# Patient Record
Sex: Female | Born: 1955
Health system: Southern US, Community
[De-identification: ages and names within clinical notes are randomized; demographics above are authoritative.]

## PROBLEM LIST (undated history)

## (undated) DIAGNOSIS — J90 Pleural effusion, not elsewhere classified: Secondary | ICD-10-CM

## (undated) DIAGNOSIS — E785 Hyperlipidemia, unspecified: Secondary | ICD-10-CM

## (undated) DIAGNOSIS — I2510 Atherosclerotic heart disease of native coronary artery without angina pectoris: Secondary | ICD-10-CM

## (undated) DIAGNOSIS — I10 Essential (primary) hypertension: Secondary | ICD-10-CM

## (undated) HISTORY — DX: Atherosclerotic heart disease of native coronary artery without angina pectoris: I25.10

---
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
## 2018-09-03 ENCOUNTER — Emergency Department (HOSPITAL_COMMUNITY)
Admission: EM | Admit: 2018-09-03 | Discharge: 2018-09-03 | Disposition: A | Discharge status: Home / Self Care | Payer: Self-pay | Attending: Emergency Medicine | Admitting: Emergency Medicine

## 2018-09-03 ENCOUNTER — Emergency Department (HOSPITAL_COMMUNITY): Payer: Self-pay

## 2018-09-03 ENCOUNTER — Encounter (HOSPITAL_COMMUNITY): Payer: Self-pay | Admitting: *Deleted

## 2018-09-03 DIAGNOSIS — I2510 Atherosclerotic heart disease of native coronary artery without angina pectoris: Secondary | ICD-10-CM | POA: Insufficient documentation

## 2018-09-03 DIAGNOSIS — K2100 Gastro-esophageal reflux disease with esophagitis, without bleeding: Secondary | ICD-10-CM

## 2018-09-03 DIAGNOSIS — I10 Essential (primary) hypertension: Secondary | ICD-10-CM | POA: Insufficient documentation

## 2018-09-03 DIAGNOSIS — R0789 Other chest pain: Secondary | ICD-10-CM | POA: Insufficient documentation

## 2018-09-03 HISTORY — DX: Essential (primary) hypertension: I10

## 2018-09-03 LAB — BASIC METABOLIC PANEL
ANION GAP: 5 (ref 5–15)
BUN: 9 mg/dL (ref 8–23)
CALCIUM: 9.5 mg/dL (ref 8.9–10.3)
CO2: 31 mmol/L (ref 22–32)
Chloride: 106 mmol/L (ref 98–111)
Creatinine, Ser: 0.94 mg/dL (ref 0.44–1.00)
GLUCOSE: 140 mg/dL — AB (ref 70–99)
Potassium: 3.9 mmol/L (ref 3.5–5.1)
Sodium: 142 mmol/L (ref 135–145)

## 2018-09-03 LAB — CBC
HCT: 41.1 % (ref 36.0–46.0)
Hemoglobin: 12.2 g/dL (ref 12.0–15.0)
MCH: 26.3 pg (ref 26.0–34.0)
MCHC: 29.7 g/dL — ABNORMAL LOW (ref 30.0–36.0)
MCV: 88.6 fL (ref 80.0–100.0)
NRBC: 0 % (ref 0.0–0.2)
PLATELETS: 302 10*3/uL (ref 150–400)
RBC: 4.64 MIL/uL (ref 3.87–5.11)
RDW: 13.2 % (ref 11.5–15.5)
WBC: 7.5 10*3/uL (ref 4.0–10.5)

## 2018-09-03 LAB — I-STAT TROPONIN, ED: Troponin i, poc: 0.01 ng/mL (ref 0.00–0.08)

## 2018-09-03 MED ORDER — SUCRALFATE 1 G PO TABS: 1.0000 g | ORAL_TABLET | Freq: Three times a day (TID) | ORAL | 0 refills | Status: DC

## 2018-09-03 MED ORDER — ALUM & MAG HYDROXIDE-SIMETH 200-200-20 MG/5ML PO SUSP
30.0000 mL | Freq: Once | ORAL | Status: AC
  Administered 2018-09-03: 30 mL via ORAL
  Filled 2018-09-03: qty 30

## 2018-09-03 MED ORDER — PANTOPRAZOLE SODIUM 20 MG PO TBEC: 20.0000 mg | DELAYED_RELEASE_TABLET | Freq: Two times a day (BID) | ORAL | 0 refills | Status: DC

## 2018-09-03 MED ORDER — AMLODIPINE BESYLATE 5 MG PO TABS: 10.0000 mg | ORAL_TABLET | Freq: Every day | ORAL | 0 refills | Status: DC

## 2018-09-03 NOTE — ED Triage Notes (Signed)
Pt in c/o chest pain that started three days ago, history of same, denies shortness of breath, no distress noted

## 2018-09-03 NOTE — ED Provider Notes (Signed)
[facility] EMERGENCY DEPARTMENT Provider Note   CSN: [id number] Arrival date & time: 09/03/18  1232     History   Chief Complaint Chief Complaint  Patient presents with  . Chest Pain    HPI Kimberly Bryant is a 62 y.o. female who presents with chest pain. PMH significant for reflux, chronic chest pain, and HTN. The patient's niece and the niece's husband are at bedside and help translate since the patient speaks only [place]. They state that the patient has had chest pain for years. It is worse when she is walking around and when she eats. She always vomits when she eats and she has bad breath. She recently moved here from [place]. She has never had a endoscopy or colonoscopy. She does not have a PCP here. No fever, cough, SOB, palpitations, leg swelling, abdominal pain, constipation/diarrhea.  HPI  Past Medical History:  Diagnosis Date  . Coronary artery disease   . Hyperlipemia   . Hypertension     There are no active problems to display for this patient.   History reviewed. No pertinent surgical history.   OB History   None      Home Medications    Prior to Admission medications   Not on File    Family History History reviewed. No pertinent family history.  Social History Social History   Tobacco Use  . Smoking status: Never Smoker  . Smokeless tobacco: Never Used  Substance Use Topics  . Alcohol use: Not on file  . Drug use: Not on file     Allergies   Patient has no known allergies.   Review of Systems Review of Systems  Constitutional: Negative for fever.  HENT: Negative for sore throat and trouble swallowing.   Respiratory: Negative for cough, shortness of breath and wheezing.   Cardiovascular: Positive for chest pain. Negative for palpitations and leg swelling.  Gastrointestinal: Positive for vomiting. Negative for abdominal pain, diarrhea and nausea.  All other systems reviewed and are negative.    Physical  Exam Updated Vital Signs BP (!) 158/76   Pulse 65   Temp 98.3 F (36.8 C) (Oral)   Resp 19   SpO2 100%   Physical Exam  Constitutional: She is oriented to person, place, and time. She appears well-developed and well-nourished. No distress.  Calm, cooperative female in NAD  HENT:  Head: Normocephalic and atraumatic.  No throat swelling or redness  Eyes: Pupils are equal, round, and reactive to light. Conjunctivae are normal. Right eye exhibits no discharge. Left eye exhibits no discharge. No scleral icterus.  Neck: Normal range of motion.  Cardiovascular: Normal rate and regular rhythm.  Pulmonary/Chest: Effort normal and breath sounds normal. No respiratory distress.  Abdominal: Soft. Bowel sounds are normal. She exhibits no distension. There is tenderness (epigastric tenderness).  Musculoskeletal:  No peripheral edema  Neurological: She is alert and oriented to person, place, and time.  Skin: Skin is warm and dry.  Psychiatric: She has a normal mood and affect. Her behavior is normal.  Nursing note and vitals reviewed.    ED Treatments / Results  Labs (all labs ordered are listed, but only abnormal results are displayed) Labs Reviewed  BASIC METABOLIC PANEL - Abnormal; Notable for the following components:      Result Value   Glucose, Bld 140 (*)    All other components within normal limits  CBC - Abnormal; Notable for the following components:   MCHC 29.7 (*)  All other components within normal limits  I-STAT TROPONIN, ED    EKG None  Radiology Dg Chest 2 View  Result Date: 09/03/2018 CLINICAL DATA:  Onset of weakness and chest pain today. EXAM: CHEST - 2 VIEW COMPARISON:  None. FINDINGS: The lungs are adequately inflated. The interstitial markings are increased at the lung bases posteriorly greatest on the right. There is no pleural effusion. The cardiac silhouette is mildly enlarged. The pulmonary vascularity is not clearly engorged. There is tortuosity of the  descending thoracic aorta. The bony thorax exhibits no acute abnormality. IMPRESSION: Bibasilar atelectasis or early pneumonia greatest on the right. Followup PA and lateral chest X-ray is recommended in 3-4 weeks following trial of antibiotic therapy to ensure resolution and exclude underlying malignancy. Electronically Signed   By: David  [place] M.D.   On: 09/03/2018 13:15    Procedures Procedures (including critical care time)  Medications Ordered in ED Medications  alum & mag hydroxide-simeth (MAALOX/MYLANTA) 200-200-20 MG/5ML suspension 30 mL (30 mLs Oral Given 09/03/18 1457)     Initial Impression / Assessment and Plan / ED Course  I have reviewed the triage vital signs and the nursing notes.  Pertinent labs & imaging results that were available during my care of the patient were reviewed by me and considered in my medical decision making (see chart for details).  62 year old female presents with chest pain which is worse after eating and walking. Pain is atypical for ACS. Doubt PE, pericarditis, esophageal rupture, tension pneumothorax, aortic dissection, cardiac tamponade. EKG is NSR. CXR shows bibasilar atelectasis. There is a comment about possible pneumonia but since she has no fever, cough, leukocytosis this is unlikely. Initial troponin is 0. Will not repeat as this is a chronic problem. Labs are unremarkable. HEART score is 2. She is already on protonix. Will refill meds and give rx for Carafate. CM was consulted to help establish with a PCP in the area. They were encouraged to follow up with PCP and eventually GI to get an endoscopy and screening colonoscopy.   Final Clinical Impressions(s) / ED Diagnoses   Final diagnoses:  Atypical chest pain  Gastroesophageal reflux disease with esophagitis    ED Discharge Orders    None       Dayne, Dekay, PA-C 09/03/18 1636    Gerhard Munch, MD 09/04/18 [phone]

## 2018-09-03 NOTE — Progress Notes (Signed)
CSW met with pt at pt's bedside. Pt recently moved to the area in need of PCP. CSW provided pt and pt's family with information about the [facility] and applying for an [employer]. Encouraged pt and pt's family to follow up with [facility] tomorrow for an appointment.   Wendelyn Breslow, [facility]  [phone]

## 2018-09-03 NOTE — Discharge Instructions (Signed)
Take Amlodipine for blood pressure once a day Take Protonix (acid reducer) twice a day Take Carafate as directed (stomach medicine) Please follow up with a primary doctor

## 2018-09-15 ENCOUNTER — Encounter: Payer: Self-pay | Admitting: Critical Care Medicine

## 2018-09-15 ENCOUNTER — Ambulatory Visit: Payer: Self-pay | Attending: Critical Care Medicine | Admitting: Critical Care Medicine

## 2018-09-15 ENCOUNTER — Other Ambulatory Visit: Payer: Self-pay

## 2018-09-15 VITALS — BP 149/86 | HR 73 | Temp 98.5°F | Resp 16 | Wt 205.6 lb

## 2018-09-15 DIAGNOSIS — I10 Essential (primary) hypertension: Secondary | ICD-10-CM | POA: Insufficient documentation

## 2018-09-15 DIAGNOSIS — J9811 Atelectasis: Secondary | ICD-10-CM | POA: Insufficient documentation

## 2018-09-15 DIAGNOSIS — K219 Gastro-esophageal reflux disease without esophagitis: Secondary | ICD-10-CM | POA: Insufficient documentation

## 2018-09-15 DIAGNOSIS — Z79899 Other long term (current) drug therapy: Secondary | ICD-10-CM | POA: Insufficient documentation

## 2018-09-15 DIAGNOSIS — R600 Localized edema: Secondary | ICD-10-CM | POA: Insufficient documentation

## 2018-09-15 DIAGNOSIS — I2510 Atherosclerotic heart disease of native coronary artery without angina pectoris: Secondary | ICD-10-CM | POA: Insufficient documentation

## 2018-09-15 DIAGNOSIS — G8929 Other chronic pain: Secondary | ICD-10-CM | POA: Insufficient documentation

## 2018-09-15 DIAGNOSIS — E785 Hyperlipidemia, unspecified: Secondary | ICD-10-CM | POA: Insufficient documentation

## 2018-09-15 DIAGNOSIS — K2100 Gastro-esophageal reflux disease with esophagitis, without bleeding: Secondary | ICD-10-CM

## 2018-09-15 DIAGNOSIS — R0789 Other chest pain: Secondary | ICD-10-CM | POA: Insufficient documentation

## 2018-09-15 DIAGNOSIS — Z8249 Family history of ischemic heart disease and other diseases of the circulatory system: Secondary | ICD-10-CM | POA: Insufficient documentation

## 2018-09-15 MED ORDER — PANTOPRAZOLE SODIUM 20 MG PO TBEC: 20.0000 mg | DELAYED_RELEASE_TABLET | Freq: Two times a day (BID) | ORAL | 3 refills | Status: DC

## 2018-09-15 MED ORDER — METOPROLOL SUCCINATE ER 25 MG PO TB24: 25.0000 mg | ORAL_TABLET | Freq: Every day | ORAL | 3 refills | Status: DC

## 2018-09-15 MED ORDER — AMLODIPINE BESYLATE 5 MG PO TABS: 10.0000 mg | ORAL_TABLET | Freq: Every day | ORAL | 3 refills | Status: DC

## 2018-09-15 MED ORDER — SUCRALFATE 1 G PO TABS: 1.0000 g | ORAL_TABLET | Freq: Three times a day (TID) | ORAL | 3 refills | Status: DC

## 2018-09-15 NOTE — Assessment & Plan Note (Signed)
Significant reflux disease as etiology of chest pain syndrome Plan Continue Carafate 4 times daily Continue Protonix 20 mg twice daily Provide patient with a reflux diet The patient ultimately may need a gastroenterology consultation but first we will obtain outside records

## 2018-09-15 NOTE — Assessment & Plan Note (Signed)
There are no outside records to review By history the patient had coronary disease of uncertain time point with an intervention performed We will need to obtain outside records

## 2018-09-15 NOTE — Assessment & Plan Note (Signed)
Atypical chest pain syndrome Plan Continue to treat reflux symptoms Obtain outside records The most recent event appears to be noncardiac

## 2018-09-15 NOTE — Assessment & Plan Note (Signed)
History of hyperlipidemia Need to obtain outside records

## 2018-09-15 NOTE — Patient Instructions (Addendum)
Continue amlodipine 10 mg daily Start metoprolol 25 mg daily Continue pantoprazole 20 mg twice daily at meals Continue Carafate 1 4 times daily with meals Follow reflux diet We will obtain outside records from your previous primary care physician We will establish here for primary care over the next month   Food Choices for Gastroesophageal Reflux Disease, Adult When you have gastroesophageal reflux disease (GERD), the foods you eat and your eating habits are very important. Choosing the right foods can help ease your discomfort. What guidelines do I need to follow?  Choose fruits, vegetables, whole grains, and low-fat dairy products.  Choose low-fat meat, fish, and poultry.  Limit fats such as oils, salad dressings, butter, nuts, and avocado.  Keep a food diary. This helps you identify foods that cause symptoms.  Avoid foods that cause symptoms. These may be different for everyone.  Eat small meals often instead of 3 large meals a day.  Eat your meals slowly, in a place where you are relaxed.  Limit fried foods.  Cook foods using methods other than frying.  Avoid drinking alcohol.  Avoid drinking large amounts of liquids with your meals.  Avoid bending over or lying down until 2-3 hours after eating. What foods are not recommended? These are some foods and drinks that may make your symptoms worse: Vegetables Tomatoes. Tomato juice. Tomato and spaghetti sauce. Chili peppers. Onion and garlic. Horseradish. Fruits Oranges, grapefruit, and lemon (fruit and juice). Meats High-fat meats, fish, and poultry. This includes hot dogs, ribs, ham, sausage, salami, and bacon. Dairy Whole milk and chocolate milk. Sour cream. Cream. Butter. Ice cream. Cream cheese. Drinks Coffee and tea. Bubbly (carbonated) drinks or energy drinks. Condiments Hot sauce. Barbecue sauce. Sweets/Desserts Chocolate and cocoa. Donuts. Peppermint and spearmint. Fats and Oils High-fat foods. This  includes [place] fries and potato chips. Other Vinegar. Strong spices. This includes black pepper, white pepper, red pepper, cayenne, curry powder, cloves, ginger, and chili powder. The items listed above may not be a complete list of foods and drinks to avoid. Contact your dietitian for more information. This information is not intended to replace advice given to you by your health care provider. Make sure you discuss any questions you have with your health care provider. Document Released: 04/22/2012 Document Revised: 03/29/2016 Document Reviewed: 08/26/2013 Elsevier Interactive Patient Education  2017 [employer].

## 2018-09-15 NOTE — Progress Notes (Signed)
HFU: pt states she feels better. Takes medication daily.

## 2018-09-15 NOTE — Progress Notes (Signed)
Subjective:    Patient ID: Kimberly Bryant, female    DOB: [birth year], 62 y.o.   MRN: [id number]  62 y.o.F here for emergency room follow-up which occurred on 09/03/2018.  Patient was seen for chest pain which appeared to be gastrointestinal in origin.  Cardiac enzymes were negative and chest x-ray showed bibasilar atelectasis. Impression was that of a gastrointestinal reflux flare.  Patient had her Protonix refilled and was given also a dose of Carafate and Maalox.  Note CBC was unremarkable and there was no fever and her heart score was 2  The patient just moved here from [place] and needs to establish for primary care and as well needs gastroenterology follow-up.  Note the patient only speaks [place]  [place]    Hx of CAD with PTCA in past. Hx of chronic chest pain.  While living in [place] Difficulty walking cannot feel anything.  ? CVA.  Legs are better now. Pain now is better   Meal plan:  AM: eats rice, eats a large amount.  Lunch Rice,  Dinner :  Rice    Chest Pain   This is a chronic problem. The current episode started more than 1 year ago. The onset quality is undetermined. The problem occurs constantly. The problem has been rapidly improving. The pain is present in the epigastric region and substernal region. The pain is at a severity of 2/10. The pain is mild. The quality of the pain is described as burning. The pain does not radiate. Associated symptoms include abdominal pain. Pertinent negatives include no cough, diaphoresis, exertional chest pressure, fever, headaches, irregular heartbeat, palpitations, shortness of breath, sputum production or weakness. Associated symptoms comments: Bad odor to breath Worse if eats. She has tried antacids (PPI, carafate ) for the symptoms. The treatment provided significant relief.   Past Medical History:  Diagnosis Date  . CAD (coronary artery disease)   . Hypertension      Family History  Problem Relation Age of Onset  .  Hypertension Mother      Social History   Socioeconomic History  . Marital status: Married    Spouse name: Not on file  . Number of children: Not on file  . Years of education: Not on file  . Highest education level: Not on file  Occupational History  . Not on file  Social Needs  . Financial resource strain: Not on file  . Food insecurity:    Worry: Not on file    Inability: Not on file  . Transportation needs:    Medical: Not on file    Non-medical: Not on file  Tobacco Use  . Smoking status: Never Smoker  . Smokeless tobacco: Never Used  Substance and Sexual Activity  . Alcohol use: Never    Frequency: Never  . Drug use: Not on file  . Sexual activity: Not on file  Lifestyle  . Physical activity:    Days per week: Not on file    Minutes per session: Not on file  . Stress: Not on file  Relationships  . Social connections:    Talks on phone: Not on file    Gets together: Not on file    Attends religious service: Not on file    Active member of club or organization: Not on file    Attends meetings of clubs or organizations: Not on file    Relationship status: Not on file  . Intimate partner violence:    Fear of current  or ex partner: Not on file    Emotionally abused: Not on file    Physically abused: Not on file    Forced sexual activity: Not on file  Other Topics Concern  . Not on file  Social History Narrative  . Not on file     No Known Allergies   Outpatient Medications Prior to Visit  Medication Sig Dispense Refill  . amLODipine (NORVASC) 5 MG tablet Take 2 tablets (10 mg total) by mouth daily. 30 tablet 0  . pantoprazole (PROTONIX) 20 MG tablet Take 1 tablet (20 mg total) by mouth 2 (two) times daily before a meal. 60 tablet 0  . sucralfate (CARAFATE) 1 g tablet Take 1 tablet (1 g total) by mouth 4 (four) times daily -  with meals and at bedtime. 90 tablet 0   No facility-administered medications prior to visit.       Review of Systems    Constitutional: Negative for diaphoresis and fever.  Respiratory: Negative for cough, sputum production and shortness of breath.   Cardiovascular: Positive for chest pain. Negative for palpitations.  Gastrointestinal: Positive for abdominal pain.  Neurological: Negative for weakness and headaches.       Objective:   Physical Exam There were no vitals filed for this visit.  Gen: Pleasant, well-nourished, in no distress,  normal affect  ENT: No lesions,  mouth clear,  oropharynx clear, no postnasal drip  Neck: No JVD, no TMG, no carotid bruits  Lungs: No use of accessory muscles, no dullness to percussion, clear without rales or rhonchi  Cardiovascular: RRR, heart sounds normal, Holo systolic murmur but no gallops, 1+  peripheral edema  Abdomen: soft and NT, no HSM,  BS normal  Musculoskeletal: No deformities, no cyanosis or clubbing  Neuro: alert, non focal  Skin: Warm, no lesions or rashes  No results found.  CXR 10/30 IMPRESSION: Bibasilar atelectasis or early pneumonia greatest on the right. Followup PA and lateral chest X-ray is recommended in 3-4 weeks following trial of antibiotic therapy to ensure resolution and exclude underlying malignancy.  EKG on 09/03/2018 showed normal sinus rhythm and nonspecific T wave changes  Troponin was 0  Lab Results  Component Value Date   WBC 7.5 09/03/2018   HGB 12.2 09/03/2018   HCT 41.1 09/03/2018   MCV 88.6 09/03/2018   PLT 302 09/03/2018   BMP Latest Ref Rng & Units 09/03/2018  Glucose 70 - 99 mg/dL 130(Q)  BUN 8 - 23 mg/dL 9  Creatinine 6.57 - 8.46 mg/dL 9.62  Sodium 952 - 841 mmol/L 142  Potassium 3.5 - 5.1 mmol/L 3.9  Chloride 98 - 111 mmol/L 106  CO2 22 - 32 mmol/L 31  Calcium 8.9 - 10.3 mg/dL 9.5        Assessment & Plan:  I personally reviewed all images and lab data in the [facility] system as well as any outside material available during this office visit and agree with the  radiology impressions.   CAD  (coronary artery disease) There are no outside records to review By history the patient had coronary disease of uncertain time point with an intervention performed We will need to obtain outside records  HTN (hypertension) Hypertension poorly controlled with associated minimal peripheral edema and mild tachycardia in the setting of potential coronary disease Plan We will add a beta-blocker metoprolol 25 mg daily Continue amlodipine 10 mg daily Note creatinine was normal  GERD (gastroesophageal reflux disease) Significant reflux disease as etiology of chest pain syndrome Plan  Continue Carafate 4 times daily Continue Protonix 20 mg twice daily Provide patient with a reflux diet The patient ultimately may need a gastroenterology consultation but first we will obtain outside records  Chest pain, atypical Atypical chest pain syndrome Plan Continue to treat reflux symptoms Obtain outside records The most recent event appears to be noncardiac   Hyperlipidemia History of hyperlipidemia Need to obtain outside records   Kimberly Bryant was seen today for hospitalization follow-up.  Diagnoses and all orders for this visit:  Gastroesophageal reflux disease with esophagitis  Coronary artery disease involving native heart without angina pectoris, unspecified vessel or lesion type  Essential hypertension  Chest pain, atypical  Hyperlipidemia, unspecified hyperlipidemia type  Other orders -     sucralfate (CARAFATE) 1 g tablet; Take 1 tablet (1 g total) by mouth 4 (four) times daily -  with meals and at bedtime. -     pantoprazole (PROTONIX) 20 MG tablet; Take 1 tablet (20 mg total) by mouth 2 (two) times daily before a meal. -     amLODipine (NORVASC) 5 MG tablet; Take 2 tablets (10 mg total) by mouth daily. -     metoprolol succinate (TOPROL-XL) 25 MG 24 hr tablet; Take 1 tablet (25 mg total) by mouth daily.    I had an extended discussion with the patient and or family lasting 10  minutes of a 25 minute visit including: Reflux diet directions

## 2018-09-15 NOTE — Assessment & Plan Note (Signed)
Hypertension poorly controlled with associated minimal peripheral edema and mild tachycardia in the setting of potential coronary disease Plan We will add a beta-blocker metoprolol 25 mg daily Continue amlodipine 10 mg daily Note creatinine was normal

## 2018-10-15 ENCOUNTER — Ambulatory Visit: Payer: Self-pay | Admitting: Family Medicine

## 2018-10-31 ENCOUNTER — Encounter: Payer: Self-pay | Admitting: Family Medicine

## 2018-10-31 ENCOUNTER — Ambulatory Visit: Payer: Self-pay | Attending: Family Medicine | Admitting: Family Medicine

## 2018-10-31 VITALS — BP 184/105 | HR 92 | Temp 98.2°F | Resp 18 | Ht 60.0 in | Wt 200.0 lb

## 2018-10-31 DIAGNOSIS — Z8249 Family history of ischemic heart disease and other diseases of the circulatory system: Secondary | ICD-10-CM | POA: Insufficient documentation

## 2018-10-31 DIAGNOSIS — R918 Other nonspecific abnormal finding of lung field: Secondary | ICD-10-CM | POA: Insufficient documentation

## 2018-10-31 DIAGNOSIS — R1115 Cyclical vomiting syndrome unrelated to migraine: Secondary | ICD-10-CM

## 2018-10-31 DIAGNOSIS — E785 Hyperlipidemia, unspecified: Secondary | ICD-10-CM | POA: Insufficient documentation

## 2018-10-31 DIAGNOSIS — R1013 Epigastric pain: Secondary | ICD-10-CM

## 2018-10-31 DIAGNOSIS — R0789 Other chest pain: Secondary | ICD-10-CM

## 2018-10-31 DIAGNOSIS — K2100 Gastro-esophageal reflux disease with esophagitis, without bleeding: Secondary | ICD-10-CM

## 2018-10-31 DIAGNOSIS — I10 Essential (primary) hypertension: Secondary | ICD-10-CM

## 2018-10-31 DIAGNOSIS — R1110 Vomiting, unspecified: Secondary | ICD-10-CM | POA: Insufficient documentation

## 2018-10-31 DIAGNOSIS — R9389 Abnormal findings on diagnostic imaging of other specified body structures: Secondary | ICD-10-CM

## 2018-10-31 DIAGNOSIS — G8929 Other chronic pain: Secondary | ICD-10-CM | POA: Insufficient documentation

## 2018-10-31 DIAGNOSIS — I2510 Atherosclerotic heart disease of native coronary artery without angina pectoris: Secondary | ICD-10-CM

## 2018-10-31 MED ORDER — METOPROLOL SUCCINATE ER 25 MG PO TB24: 25.0000 mg | ORAL_TABLET | Freq: Every day | ORAL | 3 refills | Status: DC

## 2018-10-31 MED ORDER — LANSOPRAZOLE 30 MG PO CPDR: 30.0000 mg | DELAYED_RELEASE_CAPSULE | Freq: Two times a day (BID) | ORAL | 2 refills | Status: DC

## 2018-10-31 MED ORDER — AMLODIPINE BESYLATE 5 MG PO TABS: 10.0000 mg | ORAL_TABLET | Freq: Every day | ORAL | 3 refills | Status: DC

## 2018-10-31 MED ORDER — CLARITHROMYCIN 500 MG PO TABS: 500.0000 mg | ORAL_TABLET | Freq: Two times a day (BID) | ORAL | 0 refills | Status: AC

## 2018-10-31 MED ORDER — SUCRALFATE 1 G PO TABS: 1.0000 g | ORAL_TABLET | Freq: Three times a day (TID) | ORAL | 3 refills | Status: DC

## 2018-10-31 MED ORDER — AMOXICILLIN 500 MG PO CAPS: ORAL_CAPSULE | ORAL | 0 refills | Status: DC

## 2018-10-31 MED FILL — CLARITHROMYCIN 500 MG TAB: 500 | 14 days supply | Qty: 28 | Fill #0

## 2018-10-31 MED FILL — AMOXICILLIN 500 MG CAPSULE: 500 | 14 days supply | Qty: 56 | Fill #0

## 2018-10-31 MED FILL — METOPROLOL SUCCINATE ER 25: 25 | 30 days supply | Qty: 30 | Fill #0

## 2018-10-31 MED FILL — LANSOPRAZOLE DR 30 MG CAPSU: 30 | 30 days supply | Qty: 60 | Fill #0

## 2018-10-31 MED FILL — SUCRALFATE 1 GM TABLET: 1 | 22 days supply | Qty: 90 | Fill #0

## 2018-10-31 MED FILL — AMLODIPINE BESYLATE 5 MG TA: 5 | 15 days supply | Qty: 30 | Fill #0

## 2018-10-31 NOTE — Progress Notes (Signed)
Subjective:    Patient ID: Kimberly Bryant, female    DOB: 03/13/1956, 62 y.o.   MRN: [id number]   Due to a language barrier, patient's niece as well as [employer] was used at today's visit.   HPI       62 yo female who was seen in the emergency department on 09/03/2018 secondary to atypical chest pain and per ED records, patient with past medical history of reflux, chronic chest pain, hyperlipidemia, possible past heart attack and hypertension.  At the time of her emergency department visit, patient recently moved from [place].  Patient had blood work done in the emergency department which was significant for glucose of 140 on BMP.  No EKG was done during her emergency department visit as her chest pain was atypical for heart disease as the chest pain occurs after eating. (I could not find an actual EKG report or copy of an EKG done in the emergency department but emergency room doctor mentions in his dictation that patient with EKG showing normal sinus rhythm).   Patient was given a refill of Protonix that she was already taking and prescription was given for Carafate.  Patient had chest x-ray which showed atelectasis versus pneumonia but since patient did not have a fever, cough and no leukocytosis on CBC, it was thought that her x-ray was most likely compatible with atelectasis.  Patient was asked to establish with a primary care physician and patient was seen here in the office on 09/15/2018 by another provider for initial appointment status post ED visit.  At her visit, patient was started on metoprolol 25 mg daily secondary to possible history of coronary artery disease and patient with mild tachycardia and suboptimal control of hypertension.  Per nurse's note from the last visit, patient had a blood pressure of 149/86 with pulse of 73.      At today's visit, patient is accompanied by her niece who initially acted as the interpreter but [employer] video interpreter system was also  used.  Patient reports that she is originally from [place].  Patient states that she was told at one point in the past that she likely had a heart attack.  Patient states that she was not hospitalized but was given medication at her doctor's office and sent home.  Patient states that she has recurrent central and sometimes left-sided chest discomfort.  Patient also states that within about an hour of eating anything, it comes back up.  Patient states that the food looks like what she is recently eaten.  Patient has had no green or yellow emesis.  Patient's niece reports that she has noticed that there is often a bad odor to the patient's breath.  Patient denies any issues with sinus congestion or postnasal drainage and no current dental issues.  Patient denies any difficulty with chewing or swallowing her food.  Patient does not have the sensation that food gets stuck in her throat.  Patient gets occasional discomfort in her anterior mid throat but this is not always related to eating.  Patient denies any change in bowel habits.  Patient has had no light-colored stools and no black stools.  Patient has seen no blood in the stool.  Patient denies any diarrhea or constipation.      Patient and niece state that patient was told that her chest x-ray in the emergency department was normal.  Patient denies any issues with the cough.  Patient does have some issues with shortness of  breath when she has been walking and patient occasionally has some lightheadedness/dizziness.  Patient has been taking her blood pressure medication and she does not believe that her lightheadedness/dizziness is related to her blood pressure.  Patient has not noticed any issues with increased swelling in her legs but she does tend to have some mild swelling at times.  Past Medical History:  Diagnosis Date  . CAD (coronary artery disease)   . Hypertension    Family History  Problem Relation Age of Onset  . Hypertension Mother    Social  History   Tobacco Use  . Smoking status: Never Smoker  . Smokeless tobacco: Never Used  Substance Use Topics  . Alcohol use: Never    Frequency: Never  . Drug use: Never  No Known Allergies   Review of Systems  Constitutional: Positive for fatigue. Negative for chills, diaphoresis and fever.  HENT: Positive for sore throat. Negative for congestion, postnasal drip, rhinorrhea and trouble swallowing.   Cardiovascular: Positive for chest pain and leg swelling. Negative for palpitations.  Gastrointestinal: Positive for abdominal pain and vomiting. Negative for blood in stool, constipation, diarrhea and nausea.  Endocrine: Negative for polydipsia, polyphagia and polyuria.  Genitourinary: Negative for dysuria, flank pain and frequency.  Musculoskeletal: Positive for arthralgias (occasional knee pain). Negative for back pain, gait problem and joint swelling.  Neurological: Positive for dizziness and light-headedness. Negative for headaches.  Hematological: Negative for adenopathy. Does not bruise/bleed easily.       Objective:   Physical Exam BP (!) 184/105 (BP Location: Left Arm, Patient Position: Sitting, Cuff Size: Normal)   Pulse 92   Temp 98.2 F (36.8 C) (Oral)   Resp 18   Ht 5' (1.524 m)   Wt 200 lb (90.7 kg)   SpO2 98%   BMI 39.06 kg/m  nurse's notes and vital signs reviewed General-well-nourished, well-developed overweight older female in no acute distress.  Patient is accompanied by her adult niece at today's visit who acts as interpreter. ENT- TMs within normal, patient with mild edema of the nasal turbinates, patient with mild posterior pharynx erythema and a slightly narrowed posterior airway secondary to body habitus Neck-supple, no lymphadenopathy,no thyromegaly, no carotid bruit; no reproducible neck pain with palpation Lungs-clear to auscultation bilaterally Cardiovascular- regular rate and rhythm Abdomen-mild truncal obesity, patient with mild epigastric discomfort  to palpation, no rebound or guarding Back-no CVA tenderness Extremities-patient with very mild nonpitting distal lower extremity edema None     Assessment & Plan:  1. Coronary artery disease involving native heart without angina pectoris, unspecified vessel or lesion type Patient reports possible history of heart attack when she was living in [place].  Patient reports that she is not having any pain similar to her prior diagnosis of MI.  Patient will continue use of metoprolol 25 mg daily.  Low-fat diet is recommended as well as future fasting appointment or fasting lab visit to check lipid panel.  Patient would also likely benefit from a daily 81 mg aspirin however this will be held with her current emesis and atypical chest pain which may be related to GERD/esophagitis. - metoprolol succinate (TOPROL-XL) 25 MG 24 hr tablet; Take 1 tablet (25 mg total) by mouth daily.  Dispense: 30 tablet; Refill: 3  2. Essential hypertension Patient will continue the use of metoprolol and amlodipine for control of blood pressure.  Blood pressure is elevated as patient did not take her medication prior to today's visit.  Patient is to take her medication as  soon as she arrives home today. - metoprolol succinate (TOPROL-XL) 25 MG 24 hr tablet; Take 1 tablet (25 mg total) by mouth daily.  Dispense: 30 tablet; Refill: 3 - amLODipine (NORVASC) 5 MG tablet; Take 2 tablets (10 mg total) by mouth daily.  Dispense: 60 tablet; Refill: 11  3. Gastroesophageal reflux disease with esophagitis Patient is originally from [place].  I suspect that patient may have H. pylori, bacteria that can cause stomach ulcers.  Patient has been on long-term PPI therapy with pantoprazole which could cause H. pylori breath test to be falsely negative.  Patient will have H. pylori antibodies done.  Patient will be placed on amoxicillin, Biaxin and pantoprazole for treatment.  Patient is being referred to GI for further evaluation. - H. pylori  antibody, IgG - Ambulatory referral to Gastroenterology - lansoprazole (PREVACID) 30 MG capsule; Take 1 capsule (30 mg total) by mouth 2 (two) times daily. To reduce stomach acid  Dispense: 60 capsule; Refill: 2 - amoxicillin (AMOXIL) 500 MG capsule; 2 pills twice daily for 14 days  Dispense: 56 capsule; Refill: 0 - clarithromycin (BIAXIN) 500 MG tablet; Take 1 tablet (500 mg total) by mouth 2 (two) times daily for 14 days.  Dispense: 28 tablet; Refill: 0 - Comprehensive metabolic panel - Lipase  4. Chest pain, atypical Patient with atypical chest pain which may be related to GERD with esophagitis but could also be related to a Zenker's diverticulum.  Patient will have antibody testing for H. pylori.  Patient has been referred to gastroenterology for further evaluation and treatment.  Patient will have CMP in follow-up of her atypical chest pain and epigastric pain and patient also had recent elevated glucose during her emergency department visit.  Patient will also have lipase done in follow-up of her epigastric discomfort.  Patient was given new prescription for Carafate and patient was prescribed medication for treatment of possible H. pylori infection. - H. pylori antibody, IgG - Ambulatory referral to Gastroenterology - Comprehensive metabolic panel - Lipase - sucralfate (CARAFATE) 1 g tablet; Take 1 tablet (1 g total) by mouth 4 (four) times daily -  with meals and at bedtime.  Dispense: 90 tablet; Refill: 3  5. Abnormal chest x-ray Patient with abnormal chest x-ray at her emergency department visit 09/03/2018.  Patient denies any current cough but is at increased risk of aspiration pneumonia secondary to her recurrent vomiting after meals.  Patient will be taking amoxicillin and Biaxin as part of treatment for presumed H. pylori.  Order placed for patient to repeat chest x-ray and niece is also aware of the need to obtain repeat chest x-ray. - DG Chest 2 View; Future  6. Epigastric  pain Patient with presence of epigastric pain on examination.  Will obtain antibodies to H. pylori to look for possible H. pylori infection.  Patient will continue the use of pantoprazole and new prescription for Carafate.  Patient also will have lipase level done in follow-up of epigastric tenderness - sucralfate (CARAFATE) 1 g tablet; Take 1 tablet (1 g total) by mouth 4 (four) times daily -  with meals and at bedtime.  Dispense: 90 tablet; Refill: 3  7. Persistent recurrent vomiting GI referral placed secondary to persistent, recurrent vomiting.  Patient may have a Zenker's diverticulum as she reports vomiting within an hour of eating and vomitus appears consistent with what she has recently eaten. Niece also reports that patient often has a bad odor to her breath.  Will place order for patient to have  barium swallow as I am not sure how long it will take for patient to have GI evaluation.  An After Visit Summary was printed and given to the patient.  Allergies as of 10/31/2018   No Known Allergies     Medication List       Accurate as of October 31, 2018 11:59 PM. Always use your most recent med list.        amLODipine 5 MG tablet Commonly known as:  NORVASC Take 2 tablets (10 mg total) by mouth daily.   amoxicillin 500 MG capsule Commonly known as:  AMOXIL 2 pills twice daily for 14 days   clarithromycin 500 MG tablet Commonly known as:  BIAXIN Take 1 tablet (500 mg total) by mouth 2 (two) times daily for 14 days.   lansoprazole 30 MG capsule Commonly known as:  PREVACID Take 1 capsule (30 mg total) by mouth 2 (two) times daily. To reduce stomach acid   metoprolol succinate 25 MG 24 hr tablet Commonly known as:  TOPROL-XL Take 1 tablet (25 mg total) by mouth daily.   pantoprazole 20 MG tablet Commonly known as:  PROTONIX Take 1 tablet (20 mg total) by mouth 2 (two) times daily before a meal.   sucralfate 1 g tablet Commonly known as:  CARAFATE Take 1 tablet (1 g  total) by mouth 4 (four) times daily -  with meals and at bedtime.      Return in about 2 weeks (around 11/14/2018) for GERD, HTN.

## 2018-11-02 ENCOUNTER — Encounter: Payer: Self-pay | Admitting: Family Medicine

## 2018-11-02 MED ORDER — AMLODIPINE BESYLATE 5 MG PO TABS: 10.0000 mg | ORAL_TABLET | Freq: Every day | ORAL | 11 refills | Status: DC

## 2018-11-03 LAB — H. PYLORI ANTIBODY, IGG: H. pylori, IgG AbS: 8.62 {index_val} — ABNORMAL HIGH (ref 0.00–0.79)

## 2018-11-03 LAB — COMPREHENSIVE METABOLIC PANEL WITH GFR
ALT: 19 IU/L (ref 0–32)
AST: 16 IU/L (ref 0–40)
Albumin/Globulin Ratio: 1.4 (ref 1.2–2.2)
Albumin: 4.6 g/dL (ref 3.6–4.8)
Alkaline Phosphatase: 93 IU/L (ref 39–117)
BUN/Creatinine Ratio: 11 — ABNORMAL LOW (ref 12–28)
BUN: 11 mg/dL (ref 8–27)
Bilirubin Total: 0.2 mg/dL (ref 0.0–1.2)
CO2: 23 mmol/L (ref 20–29)
Calcium: 10.1 mg/dL (ref 8.7–10.3)
Chloride: 102 mmol/L (ref 96–106)
Creatinine, Ser: 0.97 mg/dL (ref 0.57–1.00)
GFR calc Af Amer: 73 mL/min/1.73
GFR calc non Af Amer: 63 mL/min/1.73
Globulin, Total: 3.2 g/dL (ref 1.5–4.5)
Glucose: 68 mg/dL (ref 65–99)
Potassium: 4.3 mmol/L (ref 3.5–5.2)
Sodium: 141 mmol/L (ref 134–144)
Total Protein: 7.8 g/dL (ref 6.0–8.5)

## 2018-11-03 LAB — LIPASE: Lipase: 31 U/L (ref 14–72)

## 2018-11-10 ENCOUNTER — Ambulatory Visit (HOSPITAL_COMMUNITY)
Admission: RE | Admit: 2018-11-10 | Discharge: 2018-11-10 | Disposition: A | Discharge status: Home / Self Care | Payer: Self-pay | Source: Ambulatory Visit | Attending: Family Medicine | Admitting: Family Medicine

## 2018-11-10 DIAGNOSIS — R9389 Abnormal findings on diagnostic imaging of other specified body structures: Secondary | ICD-10-CM | POA: Insufficient documentation

## 2018-11-21 ENCOUNTER — Ambulatory Visit: Payer: Self-pay | Attending: Family Medicine | Admitting: Family Medicine

## 2018-11-21 ENCOUNTER — Encounter: Payer: Self-pay | Admitting: Family Medicine

## 2018-11-21 VITALS — BP 151/85 | HR 65 | Temp 97.9°F | Resp 18 | Ht 61.0 in | Wt 204.0 lb

## 2018-11-21 DIAGNOSIS — R0789 Other chest pain: Secondary | ICD-10-CM

## 2018-11-21 DIAGNOSIS — R0600 Dyspnea, unspecified: Secondary | ICD-10-CM

## 2018-11-21 DIAGNOSIS — I10 Essential (primary) hypertension: Secondary | ICD-10-CM

## 2018-11-21 DIAGNOSIS — Z1239 Encounter for other screening for malignant neoplasm of breast: Secondary | ICD-10-CM

## 2018-11-21 DIAGNOSIS — K219 Gastro-esophageal reflux disease without esophagitis: Secondary | ICD-10-CM

## 2018-11-21 DIAGNOSIS — A048 Other specified bacterial intestinal infections: Secondary | ICD-10-CM

## 2018-11-21 DIAGNOSIS — R0609 Other forms of dyspnea: Secondary | ICD-10-CM

## 2018-11-21 MED FILL — SUCRALFATE 1 GM TABLET: 1 | 22 days supply | Qty: 90 | Fill #1

## 2018-11-21 MED FILL — AMLODIPINE BESYLATE 5 MG TA: 5 | 15 days supply | Qty: 30 | Fill #1

## 2018-11-21 NOTE — Progress Notes (Signed)
Subjective:    Patient ID: Kimberly Bryant, female    DOB: 02/26/1956, 63 y.o.   MRN: [id number]   Due to a language barrier, [employer] video interpretation system and patient's niece assisted with translation at today's visit  HPI       63 yo female who is seen in follow-up of hypertension, positive test for H. Pylori and atypical chest pain.  Patient reports that she was made aware of the diagnosis of a positive test for bacteria that can cause stomach ulcers.  Patient states that she is completed the antibiotics and is taking the reflux medication and patient reports that she is no longer having abdominal pain or sensation of acid reflux.  Patient however reports that she continues to have some right-sided upper chest pain as well as some shortness of breath with activity.  Patient is originally from [place] and patient states that she was once told when she went to the doctor that she had a heart attack.  Patient reports that she was not hospitalized but instead was given some medication to take.  Patient denies any left-sided chest pain.  Patient has had no radiation of pain to either arm or to the neck and no sensation of upper or mid back pain.  Patient denies any issues with nausea since completion of treatment for H. pylori.  Patient has had no episodes of sweating/diaphoresis.  Patient does have some fatigue.      Patient is taking both the metoprolol and amlodipine for treatment of hypertension.  Patient denies any headaches or dizziness related to her blood pressure.  And no headaches or dizziness in general.  Patient has started  81 mg aspirin daily as prescribed at her last visit.  Past Medical History:  Diagnosis Date  . CAD (coronary artery disease)   . Hypertension    Family History  Problem Relation Age of Onset  . Hypertension Mother    Social History   Tobacco Use  . Smoking status: Never Smoker  . Smokeless tobacco: Never Used  Substance Use Topics  . Alcohol use: Never   Frequency: Never  . Drug use: Never  No Known Allergies   Review of Systems  Constitutional: Positive for fatigue. Negative for chills, diaphoresis and fever.  HENT: Negative for sore throat and trouble swallowing.   Respiratory: Positive for shortness of breath (SOB with minimal activity). Negative for cough.   Cardiovascular: Positive for chest pain (right upper chest). Negative for palpitations and leg swelling.  Gastrointestinal: Negative for abdominal pain, blood in stool, constipation, diarrhea, nausea and vomiting.  Endocrine: Negative for polydipsia, polyphagia and polyuria.  Genitourinary: Negative for dysuria, flank pain and frequency.  Musculoskeletal: Positive for arthralgias (knee pain). Negative for back pain, gait problem, joint swelling and myalgias.  Neurological: Negative for dizziness and headaches.  Hematological: Negative for adenopathy. Does not bruise/bleed easily.       Objective:   Physical Exam BP (!) 151/85 (BP Location: Right Arm, Patient Position: Sitting, Cuff Size: Large)   Pulse 65   Temp 97.9 F (36.6 C) (Oral)   Resp 18   Ht 5\' 1"  (1.549 m)   Wt 204 lb (92.5 kg)   SpO2 99%   BMI 38.55 kg/m Nurse's notes and vital signs reviewed General-well-nourished, well-developed overweight older female in no acute distress.  Patient is accompanied by family member at today's visit Neck-supple, no lymphadenopathy, no JVD Cardiovascular-regular rate and rhythm Lungs-clear to auscultation bilaterally Chest- patient does have some reproducible  pain in the right upper outer chest with reaching overhead and arm reaching back slightly. ABD- truncal obesity, soft and non-tender Back- no CVA tenderness EXT- mild puffiness at the ankles        Assessment & Plan:  1. Chest pain, atypical Patient continues to have complaint of recurrent brief right-sided chest pain/pressure along with shortness of breath with exertion.  Patient also has reported that when she  lived in [place] she was told that she had a heart attack at one point however patient states that she was only given medication by mouth and did not have any studies or interventions therefore I am not sure that she actually had a heart attack as there are also no available medical records.  Patient has started 81 mg aspirin and is currently on metoprolol.  Patient did have prior chest x-ray which showed no abnormality in the area of her chest pain. Pt will also be scheduled for a mammogram. - Ambulatory referral to Cardiology  2. Gastroesophageal reflux disease, esophagitis presence not specified Patient reports no symptoms s/p treatment for H. Pylori but is still on PPI  3. Essential hypertension Blood pressure remains elevated at today's visit.  At patient's last visit, her amlodipine was increased from 5 to 10 mg daily.  Patient is also on metoprolol 25 mg once daily.  Patient is being referred to cardiology in follow-up of her atypical chest pain and hypertension. - Ambulatory referral to Cardiology  4. Dyspnea on exertion Patient being referred to cardiology due to SOB on exertion and atypical chest pain - Ambulatory referral to Cardiology  5. Screening for breast cancer Patient cannot recall when she has had last mammogram therefore she will be scheduled for a screening mammogram - MM Digital Screening; Future  6. Positive H. pylori test Patient s/p treatment for H. Pylori and her recurrent N/V and abdominal pain have resolved  An After Visit Summary was printed and given to the patient.  Return in about 6 weeks (around 01/02/2019) for BP check in 2-3 weeks;6-8 weeks with PCP.

## 2018-12-05 ENCOUNTER — Telehealth: Payer: Self-pay | Admitting: *Deleted

## 2018-12-05 NOTE — Telephone Encounter (Signed)
-----   Message from Cain Saupe, MD sent at 11/01/2018 11:58 PM EST ----- Notify patient that her complete metabolic panel was normal. Lipase (pancreatic enzyme) was normal. H. Pylori test is pending

## 2018-12-05 NOTE — Telephone Encounter (Signed)
-----   Message from Cain Saupe, MD sent at 11/03/2018  8:44 PM EST ----- Please notify patient and her niece that the antibody test was positive for antibodies to the bacteria that can cause stomach ulcers and patient should take the antibiotics-amoxicillin and clarithromycin (biaxin) as well as the medication to decrease stomach acid-lansoprazole that were prescribed at patient's recent visit. Patient should call/notify the office if she has any issues tolerating the medications and should also keep scheduled f/u with GI and for barium swallow

## 2018-12-05 NOTE — Telephone Encounter (Signed)
Medical Assistant used [employer] to contact patient.  Interpreter Name:  Zollie ScaleOlivia Interpreter #: [phone] Patient was made aware of results in office.

## 2018-12-05 NOTE — Telephone Encounter (Signed)
Medical Assistant used [employer] to contact patient.  Interpreter Name:Olivia  Interpreter #: [phone]

## 2018-12-08 ENCOUNTER — Encounter: Payer: Self-pay | Admitting: Pharmacist

## 2018-12-10 ENCOUNTER — Encounter: Payer: Self-pay | Admitting: Pharmacist

## 2018-12-10 ENCOUNTER — Ambulatory Visit: Payer: Self-pay | Attending: Family Medicine | Admitting: Pharmacist

## 2018-12-10 VITALS — BP 132/86 | HR 80

## 2018-12-10 DIAGNOSIS — Z79899 Other long term (current) drug therapy: Secondary | ICD-10-CM | POA: Insufficient documentation

## 2018-12-10 DIAGNOSIS — I10 Essential (primary) hypertension: Secondary | ICD-10-CM | POA: Insufficient documentation

## 2018-12-10 DIAGNOSIS — Z23 Encounter for immunization: Secondary | ICD-10-CM | POA: Insufficient documentation

## 2018-12-10 MED ORDER — AMLODIPINE BESYLATE 10 MG PO TABS: 10.0000 mg | ORAL_TABLET | Freq: Every day | ORAL | 3 refills | Status: DC

## 2018-12-10 MED ORDER — ATORVASTATIN CALCIUM 40 MG PO TABS: 40.0000 mg | ORAL_TABLET | Freq: Every day | ORAL | 2 refills | Status: DC

## 2018-12-10 MED ORDER — LISINOPRIL 5 MG PO TABS: 5.0000 mg | ORAL_TABLET | Freq: Every day | ORAL | 2 refills | Status: DC

## 2018-12-10 MED FILL — LANSOPRAZOLE DR 30 MG CAPSU: 30 | 30 days supply | Qty: 60 | Fill #1

## 2018-12-10 MED FILL — METOPROLOL SUCCINATE ER 25: 25 | 30 days supply | Qty: 30 | Fill #1

## 2018-12-10 MED FILL — ATORVASTATIN 40 MG TABLET: 40 | 30 days supply | Qty: 30 | Fill #0

## 2018-12-10 MED FILL — AMLODIPINE BESYLATE 10 MG T: 10 | 30 days supply | Qty: 30 | Fill #0

## 2018-12-10 MED FILL — LISINOPRIL 5 MG TAB: 5 | 30 days supply | Qty: 30 | Fill #0

## 2018-12-10 NOTE — Progress Notes (Signed)
   S:    PCP: Dr. Jillyn Hidden  Patient arrives in good spirits. Presents to the clinic for hypertension evaluation, counseling, and management. Patient was referred by Dr. Jillyn Hidden on 11/21/17.  151/85 at that visit - Cardio referral made.   Pt reports adherence to her medications. Pt has history of atypical chest pain. It is unclear if she has suffered an event in the past. She endorses an event that caused right-sided numbness in the past. Per pt, she has never been dx with a stroke. She reported having an MI to Dr. Jillyn Hidden, but has never been managed for this.   Denies any changes in pain or chest discomfort. Denies dyspnea, HA or blurred vision.   Current BP Medications include:   - Amlodipine 10 mg (two of the 5 mg tablets) - Toprol XL 25 mg daily  Antihypertensives tried in the past include: amlodipine 5 mg   Dietary habits include: does not limit salt; denies drinking caffeine  Exercise habits include: no exercise  Family / Social history: never smoker, denies alcohol   Home BP readings: does not take BP at home  O:  L arm after 5 minutes rest: 132/86, HR 80 Last 3 Office BP readings: BP Readings from Last 3 Encounters:  12/10/18 132/86  11/21/18 (!) 151/85  10/31/18 (!) 184/105   BMET    Component Value Date/Time   NA 141 10/31/2018 1119   K 4.3 10/31/2018 1119   CL 102 10/31/2018 1119   CO2 23 10/31/2018 1119   GLUCOSE 68 10/31/2018 1119   GLUCOSE 140 (H) 09/03/2018 1311   BUN 11 10/31/2018 1119   CREATININE 0.97 10/31/2018 1119   CALCIUM 10.1 10/31/2018 1119   GFRNONAA 63 10/31/2018 1119   GFRAA 73 10/31/2018 1119   Renal function: CrCl cannot be calculated (Patient's most recent lab result is older than the maximum 21 days allowed.).  Clinical ASCVD: unsure The ASCVD Risk score Denman George DC Jr., et al., 2013) failed to calculate for the following reasons:   Cannot find a previous HDL lab   Cannot find a previous total cholesterol lab  A/P: Hypertension longstanding  currently uncontrolled on current medications. BP Goal <130/80 mmHg. Patient seems to be adherent with current medications based on her blood pressure and per her niece.   Pt with possible history of MI/ACS but this is unclear. For additional BP lowering and morbidity/mortality benefit, will have her add low-dose lisinopril. Counseled extensively concerning angioedema risk and s/sx of hypotension. She knows when to seek emergent care and when to reach out to the clinic. Additionally, will gain lipid panel and start atorvastatin 40 mg. Last LFT in 10/2018 reviewed and WNL. Counseled extensively on benefit vs risk of statin therapy.   -Continued amlodipine and metoprolol succinate.  -Add lisinopril 5 mg daily -F/u labs ordered - lipid -Counseled on lifestyle modifications for blood pressure control including reduced dietary sodium, increased exercise, adequate sleep - HM: flu and tetanus indicated; given in clinic today  Results reviewed and written information provided. Total time in face-to-face counseling 30 minutes.   F/U clinic visit next monthw/ Dr. Jillyn Hidden.    Patient seen with: Arletha Pili, PharmD Candidate  [employer]  Class of 2022  Butch Penny, PharmD, CPP Clinical Pharmacist [facility] [phone]

## 2018-12-10 NOTE — Patient Instructions (Signed)
Thank you for coming to see [place] today.   Blood pressure today is improving.   Continue taking metoprolol and and amlodipine. We are adding lisinopril today.  Your regimen is as follows:  1. Metoprolol: take 1 tablet a day. Take in the morning. 2. Lisinopril: take 1 tablet a day. Take in the morning with the metoprolol. 3. Amlodipine: take 1 tablet a day. Take in the evening before bedtime.  4. Atorvastatin: take 1 tablet a day. Take in the evening before bedtime with the amlodipine.   Limiting salt and caffeine, as well as exercising as able for at least 30 minutes for 5 days out of the week, can also help you lower your blood pressure.  Take your blood pressure at home if you are able. Please write down these numbers and bring them to your visits.  If you have any questions about medications, please call me [phone].  Franky Macho

## 2018-12-11 LAB — LIPID PANEL
Chol/HDL Ratio: 3.3 ratio (ref 0.0–4.4)
Cholesterol, Total: 177 mg/dL (ref 100–199)
HDL: 54 mg/dL
LDL Calculated: 109 mg/dL — ABNORMAL HIGH (ref 0–99)
Triglycerides: 68 mg/dL (ref 0–149)
VLDL Cholesterol Cal: 14 mg/dL (ref 5–40)

## 2018-12-19 NOTE — Progress Notes (Signed)
MA will contact patient with results once medication as been added ensuring patient receives all information at one time.

## 2018-12-23 ENCOUNTER — Telehealth: Payer: Self-pay | Admitting: *Deleted

## 2018-12-23 NOTE — Telephone Encounter (Signed)
-----  Message from Antony Blackbird, MD sent at 12/21/2018  1:22 PM EST ----- Unfortunately sometimes labs will need to be given without all the results being available, so in the future, please inform patients of their results because their lives may depend on receiving timely medical information/test results. The medication in question was added previously by the clinical pharmacist when he met with her and the pharmacist discussed the medication choice with me personally and by sending the note regarding the encounter. The result note was to also re-enforce to the patient the importance of taking the cholesterol medication. Please contact patient with her results and encourage her to take the atorvastatin 40 mg previously prescribed for her. Thank you.

## 2018-12-23 NOTE — Telephone Encounter (Signed)
Medical Assistant used [employer] to contact patient.  Interpreter Name: Ambriana Scher #: [id number] Patient was not available, [employer] left patient a voicemail. Voicemail states to give a call back to [place] with [facility] at [phone].

## 2019-01-05 ENCOUNTER — Ambulatory Visit: Payer: Self-pay | Admitting: Family Medicine

## 2019-01-13 ENCOUNTER — Encounter: Payer: Self-pay | Admitting: Family Medicine

## 2019-01-16 MED FILL — ATORVASTATIN CALCIUM 40 MG: 40 | 30 days supply | Qty: 30 | Fill #1

## 2019-01-16 MED FILL — AMLODIPINE BESYLATE 10 MG T: 10 | 30 days supply | Qty: 30 | Fill #1

## 2019-01-16 MED FILL — LISINOPRIL 5 MG TAB: 5 | 30 days supply | Qty: 30 | Fill #1

## 2019-01-16 MED FILL — METOPROLOL SUCCINATE ER 25: 25 | 30 days supply | Qty: 30 | Fill #2

## 2019-03-02 MED FILL — AMLODIPINE BESYLATE 10 MG T: 10 | 30 days supply | Qty: 30 | Fill #2

## 2019-03-02 MED FILL — METOPROLOL SUCCINATE ER 25: 25 | 30 days supply | Qty: 30 | Fill #3

## 2019-03-02 MED FILL — LISINOPRIL 5 MG TAB: 5 | 30 days supply | Qty: 30 | Fill #2

## 2019-03-02 MED FILL — ATORVASTATIN CALCIUM 40 MG: 40 | 30 days supply | Qty: 30 | Fill #2

## 2019-04-10 MED FILL — AMLODIPINE BESYLATE 10 MG T: 10 | 30 days supply | Qty: 30 | Fill #3

## 2019-05-13 ENCOUNTER — Ambulatory Visit: Payer: Self-pay | Admitting: Family Medicine

## 2019-05-18 ENCOUNTER — Ambulatory Visit: Payer: 59 | Attending: Family Medicine | Admitting: Family Medicine

## 2019-05-18 ENCOUNTER — Other Ambulatory Visit: Payer: Self-pay

## 2019-05-18 ENCOUNTER — Encounter: Payer: Self-pay | Admitting: Family Medicine

## 2019-05-18 ENCOUNTER — Ambulatory Visit: Payer: Self-pay | Admitting: Family Medicine

## 2019-05-18 VITALS — BP 150/94 | HR 69 | Temp 97.8°F | Ht 61.0 in | Wt 195.0 lb

## 2019-05-18 DIAGNOSIS — R0789 Other chest pain: Secondary | ICD-10-CM

## 2019-05-18 DIAGNOSIS — M79644 Pain in right finger(s): Secondary | ICD-10-CM

## 2019-05-18 DIAGNOSIS — E785 Hyperlipidemia, unspecified: Secondary | ICD-10-CM

## 2019-05-18 DIAGNOSIS — K219 Gastro-esophageal reflux disease without esophagitis: Secondary | ICD-10-CM

## 2019-05-18 DIAGNOSIS — R1011 Right upper quadrant pain: Secondary | ICD-10-CM

## 2019-05-18 DIAGNOSIS — I10 Essential (primary) hypertension: Secondary | ICD-10-CM

## 2019-05-18 DIAGNOSIS — R112 Nausea with vomiting, unspecified: Secondary | ICD-10-CM

## 2019-05-18 MED ORDER — LISINOPRIL 5 MG PO TABS: 5.0000 mg | ORAL_TABLET | Freq: Every day | ORAL | 2 refills | Status: DC

## 2019-05-18 MED ORDER — DICLOFENAC SODIUM 1 % TD GEL: 2.0000 g | Freq: Four times a day (QID) | TRANSDERMAL | 4 refills | Status: DC

## 2019-05-18 MED ORDER — METOPROLOL SUCCINATE ER 25 MG PO TB24: 25.0000 mg | ORAL_TABLET | Freq: Every day | ORAL | 3 refills | Status: DC

## 2019-05-18 MED ORDER — SUCRALFATE 1 G PO TABS: 1.0000 g | ORAL_TABLET | Freq: Three times a day (TID) | ORAL | 3 refills | Status: DC

## 2019-05-18 MED ORDER — ATORVASTATIN CALCIUM 40 MG PO TABS: 40.0000 mg | ORAL_TABLET | Freq: Every day | ORAL | 2 refills | Status: DC

## 2019-05-18 MED ORDER — LANSOPRAZOLE 30 MG PO CPDR: 30.0000 mg | DELAYED_RELEASE_CAPSULE | Freq: Two times a day (BID) | ORAL | 2 refills | Status: DC

## 2019-05-18 MED ORDER — AMLODIPINE BESYLATE 10 MG PO TABS: 10.0000 mg | ORAL_TABLET | Freq: Every day | ORAL | 3 refills | Status: DC

## 2019-05-18 NOTE — Progress Notes (Signed)
Established Patient Office Visit  Subjective:  Patient ID: Kimberly Bryant, female    DOB: 02/25/1956  Age: 63 y.o. MRN: [id number]   Due to a language barrier, [employer] video interpretation system used at today's visit.  Patient's native language is [place] Creole.  Patient is originally from [place].  CC:  Chief Complaint  Patient presents with  . Hypertension  . Medication Refill    HPI Kimberly SamMarie C Claude presents for follow-up of chronic medical issues including hypertension and hyperlipidemia.  She also has history of GERD but reports that the reflux medication that she has been taking, pantoprazole, has not been working as well.  She reports that when she took Carafate that this medication did seem to help.  She does not feel as if she has pain in her upper abdomen but rather is having pain mostly in her right upper chest as well as her right upper back.  She has also had some episodes of nausea and vomiting within the past 2 weeks.  When she is having the right-sided chest pain, she denies any associated nausea, no sweating/diaphoresis and no radiation of pain or abnormal sensation into her neck or left shoulder/arm.  She has had no recent shortness of breath or cough.  She denies any diarrhea or loose stools.  No change in color of the stools.  No black stools and no blood in the stools.      Patient's daughter reports that yesterday, patient was not feeling well and spent most of the day in bed.  Patient reports that yesterday she had nausea as well as loss of appetite and just did not feel well in general.      She has been taking her blood pressure medication as well as medication for cholesterol.  She denies any headaches or dizziness related to her blood pressure and has had no increase muscle or joint pain related to her use of cholesterol medication.  She currently works at a [employer] and has noticed that she has had recent onset of dull, aching pain in her right thumb and  decreased pain after gripping the knife that she uses at work.  She also has occasional sharp pain with certain movements of her or attempting to grip objects.  Past Medical History:  Diagnosis Date  . CAD (coronary artery disease)   . Hypertension     Family History  Problem Relation Age of Onset  . Hypertension Mother    Social History   Tobacco Use  . Smoking status: Never Smoker  . Smokeless tobacco: Never Used  Substance Use Topics  . Alcohol use: Never    Frequency: Never  . Drug use: Never    Outpatient Medications Prior to Visit  Medication Sig Dispense Refill  . amLODipine (NORVASC) 10 MG tablet Take 1 tablet (10 mg total) by mouth daily. 90 tablet 3  . lisinopril (PRINIVIL,ZESTRIL) 5 MG tablet Take 1 tablet (5 mg total) by mouth daily. 30 tablet 2  . metoprolol succinate (TOPROL-XL) 25 MG 24 hr tablet Take 1 tablet (25 mg total) by mouth daily. 30 tablet 3  . atorvastatin (LIPITOR) 40 MG tablet Take 1 tablet (40 mg total) by mouth daily. (Patient not taking: Reported on 05/18/2019) 30 tablet 2  . lansoprazole (PREVACID) 30 MG capsule Take 1 capsule (30 mg total) by mouth 2 (two) times daily. To reduce stomach acid (Patient not taking: Reported on 05/18/2019) 60 capsule 2  . pantoprazole (PROTONIX) 20 MG tablet  Take 1 tablet (20 mg total) by mouth 2 (two) times daily before a meal. (Patient not taking: Reported on 05/18/2019) 60 tablet 3  . sucralfate (CARAFATE) 1 g tablet Take 1 tablet (1 g total) by mouth 4 (four) times daily -  with meals and at bedtime. (Patient not taking: Reported on 05/18/2019) 90 tablet 3   No facility-administered medications prior to visit.     No Known Allergies  ROS Review of Systems  Constitutional: Positive for fatigue. Negative for chills and fever.  HENT: Negative for sore throat and trouble swallowing.   Respiratory: Negative for cough and shortness of breath.   Cardiovascular: Positive for chest pain. Negative for palpitations and leg  swelling.  Gastrointestinal: Negative for abdominal pain (patient denies abdominal pain, just in the right chest and upper back), constipation, diarrhea and nausea.  Endocrine: Negative for polydipsia, polyphagia and polyuria.  Genitourinary: Negative for dysuria and frequency.  Musculoskeletal: Positive for arthralgias and back pain.  Neurological: Negative for dizziness and headaches.  Hematological: Negative for adenopathy. Does not bruise/bleed easily.      Objective:    Physical Exam  Constitutional: She is oriented to person, place, and time. She appears well-developed and well-nourished.  WNWD older female in NAD sitting on exam table; she is accompanied by her daughter  Neck: Normal range of motion. Neck supple. No JVD present.  Cardiovascular: Normal rate and regular rhythm.  Pulmonary/Chest: Effort normal and breath sounds normal.  Abdominal: Soft. There is abdominal tenderness. There is no rebound and no guarding.  Right upper quadrant pain and patient with reproduction of right chest pain with RUQ and epigastric palpation  Musculoskeletal:        General: Tenderness present. No edema.     Comments: Pain with ROM of the right thumb  Lymphadenopathy:    She has no cervical adenopathy.  Neurological: She is alert and oriented to person, place, and time.  Skin: Skin is warm and dry.  Psychiatric: She has a normal mood and affect. Her behavior is normal. Thought content normal.  Nursing note and vitals reviewed.   BP (!) 150/94 (BP Location: Right Arm, Patient Position: Sitting, Cuff Size: Large)   Pulse 69   Temp 97.8 F (36.6 C) (Oral)   Ht 5\' 1"  (1.549 m)   Wt 195 lb (88.5 kg)   SpO2 97%   BMI 36.84 kg/m  Wt Readings from Last 3 Encounters:  05/18/19 195 lb (88.5 kg)  11/21/18 204 lb (92.5 kg)  10/31/18 200 lb (90.7 kg)     Health Maintenance Due  Topic Date Due  . Hepatitis C Screening  July 17, 1956  . HIV Screening  02/06/1972  . PAP SMEAR-Modifier   02/05/1978  . MAMMOGRAM  02/06/2007  . COLONOSCOPY  02/06/2007    No results found for: TSH Lab Results  Component Value Date   WBC 7.5 09/03/2018   HGB 12.2 09/03/2018   HCT 41.1 09/03/2018   MCV 88.6 09/03/2018   PLT 302 09/03/2018   Lab Results  Component Value Date   NA 141 10/31/2018   K 4.3 10/31/2018   CO2 23 10/31/2018   GLUCOSE 68 10/31/2018   BUN 11 10/31/2018   CREATININE 0.97 10/31/2018   BILITOT 0.2 10/31/2018   ALKPHOS 93 10/31/2018   AST 16 10/31/2018   ALT 19 10/31/2018   PROT 7.8 10/31/2018   ALBUMIN 4.6 10/31/2018   CALCIUM 10.1 10/31/2018   ANIONGAP 5 09/03/2018   Lab Results  Component Value  Date   CHOL 177 12/10/2018   Lab Results  Component Value Date   HDL 54 12/10/2018   Lab Results  Component Value Date   LDLCALC 109 (H) 12/10/2018   Lab Results  Component Value Date   TRIG 68 12/10/2018   Lab Results  Component Value Date   CHOLHDL 3.3 12/10/2018   No results found for: HGBA1C    Assessment & Plan:  1. Chest pain, atypical; 3. GERD: RUQ pain Patient with atypical chest pain and on exam patient has reproducibility of chest pain with palpation of the epigastric area.  Patient also has some right upper quadrant discomfort therefore we will also obtain right upper quadrant ultrasound to look for gallstones/liver abnormality.  Patient with complaint of pain that also occurs in her right upper back along with her chest pain and nausea.  Patient will be referred to gastroenterology for further follow-up of her epigastric pain and nausea.  Patient will be notified of the ultrasound results and will be referred to surgery if needed based on those results.  Patient however also with a history of coronary artery disease and she will also be referred to cardiology for further evaluation and treatment.  Patient will have CMP to look for electrolyte or liver abnormality, lipase to look for elevated pancreatic enzymes and CBC to look for signs of  infection/elevated white blood cell count or signs of anemia/blood loss.  Patient provided with refill of Carafate which she reports helped in the past with reflux symptoms.  New prescription also provided for lansoprazole. - Ambulatory referral to Gastroenterology - US Abdomen Limited RUQ; Future - Comprehensive metabolic panel - Lipase - CBC with Differential - Ambulatory referral to Cardiology - sucralfate (CARAFATE) 1 g tablet; Take 1 tablet (1 g total) by mouth 4 (four) times daily -  with meals and at bedtime.  Dispense: 90 tablet; Refill: 3  2. Essential hypertension Patient provided with refills of Norvasc and lisinopril as well as metoprolol for continued treatment of hypertension.  Low-sodium/DASH diet encouraged - amLODipine (NORVASC) 10 MG tablet; Take 1 tablet (10 mg total) by mouth daily.  Dispense: 90 tablet; Refill: 3 - lisinopril (ZESTRIL) 5 MG tablet; Take 1 tablet (5 mg total) by mouth daily.  Dispense: 30 tablet; Refill: 2 - metoprolol succinate (TOPROL-XL) 25 MG 24 hr tablet; Take 1 tablet (25 mg total) by mouth daily.  Dispense: 30 tablet; Refill: 3  4. Hyperlipidemia, unspecified hyperlipidemia type Refill provided of atorvastatin for treatment of hyperlipidemia and patient will have CMP at today's visit in follow-up of long-term use of medications as well as in follow-up of her atypical chest pain. - atorvastatin (LIPITOR) 40 MG tablet; Take 1 tablet (40 mg total) by mouth daily.  Dispense: 30 tablet; Refill: 2  5. Non-intractable vomiting with nausea, unspecified vomiting type Patient with complaint of recurrent nausea with vomiting.  Patient will be referred to gastroenterology for further follow-up of GERD/epigastric discomfort and will obtain right upper quadrant ultrasound to look for liver or gallbladder abnormality which may be contributing to her symptoms - Ambulatory referral to Gastroenterology - US Abdomen Limited RUQ; Future  6. Thumb pain, right Patient  with right thumb pain that likely represents osteoarthritis.  Prescription for diclofenac gel to apply up to 4 times daily as needed for pain.  Will avoid oral nonsteroidal anti-inflammatories at this time secondary to patient's atypical chest pain which is suspected to be GERD related. - diclofenac sodium (VOLTAREN) 1 % GEL; Apply 2 g  topically 4 (four) times daily. To area of pain  Dispense: 150 g; Refill: 4   An After Visit Summary was printed and given to the patient.  Follow-up: Return in about 2 weeks (around 06/01/2019), or if symptoms worsen or fail to improve, for right sided chest pain; 2 week f/u-sooner if needed; ED if acutely worse.   Cain Saupeammie Colleena Kurtenbach, MD

## 2019-05-18 NOTE — Progress Notes (Signed)
Med refills and HTN

## 2019-05-19 LAB — CBC WITH DIFFERENTIAL/PLATELET
Basophils Absolute: 0 x10E3/uL (ref 0.0–0.2)
Basos: 1 %
EOS (ABSOLUTE): 0.2 x10E3/uL (ref 0.0–0.4)
Eos: 3 %
Hematocrit: 38.9 % (ref 34.0–46.6)
Hemoglobin: 12.1 g/dL (ref 11.1–15.9)
Immature Grans (Abs): 0 x10E3/uL (ref 0.0–0.1)
Immature Granulocytes: 0 %
Lymphocytes Absolute: 3.8 x10E3/uL — ABNORMAL HIGH (ref 0.7–3.1)
Lymphs: 55 %
MCH: 27.1 pg (ref 26.6–33.0)
MCHC: 31.1 g/dL — ABNORMAL LOW (ref 31.5–35.7)
MCV: 87 fL (ref 79–97)
Monocytes Absolute: 0.4 x10E3/uL (ref 0.1–0.9)
Monocytes: 6 %
Neutrophils Absolute: 2.3 x10E3/uL (ref 1.4–7.0)
Neutrophils: 35 %
Platelets: 270 x10E3/uL (ref 150–450)
RBC: 4.47 x10E6/uL (ref 3.77–5.28)
RDW: 12.9 % (ref 11.7–15.4)
WBC: 6.7 x10E3/uL (ref 3.4–10.8)

## 2019-05-19 LAB — COMPREHENSIVE METABOLIC PANEL WITH GFR
ALT: 18 IU/L (ref 0–32)
AST: 16 IU/L (ref 0–40)
Albumin/Globulin Ratio: 1.5 (ref 1.2–2.2)
Albumin: 4.4 g/dL (ref 3.8–4.8)
Alkaline Phosphatase: 70 IU/L (ref 39–117)
BUN/Creatinine Ratio: 11 — ABNORMAL LOW (ref 12–28)
BUN: 12 mg/dL (ref 8–27)
Bilirubin Total: 0.2 mg/dL (ref 0.0–1.2)
CO2: 25 mmol/L (ref 20–29)
Calcium: 9.7 mg/dL (ref 8.7–10.3)
Chloride: 107 mmol/L — ABNORMAL HIGH (ref 96–106)
Creatinine, Ser: 1.07 mg/dL — ABNORMAL HIGH (ref 0.57–1.00)
GFR calc Af Amer: 64 mL/min/1.73
GFR calc non Af Amer: 56 mL/min/1.73 — ABNORMAL LOW
Globulin, Total: 2.9 g/dL (ref 1.5–4.5)
Glucose: 88 mg/dL (ref 65–99)
Potassium: 4.9 mmol/L (ref 3.5–5.2)
Sodium: 147 mmol/L — ABNORMAL HIGH (ref 134–144)
Total Protein: 7.3 g/dL (ref 6.0–8.5)

## 2019-05-19 LAB — LIPASE: Lipase: 33 U/L (ref 14–72)

## 2019-05-26 ENCOUNTER — Encounter: Payer: Self-pay | Admitting: Gastroenterology

## 2019-05-26 ENCOUNTER — Telehealth: Payer: Self-pay | Admitting: Emergency Medicine

## 2019-05-26 NOTE — Telephone Encounter (Signed)
Nurse using interpreter [phone] called the patient's home phone number but received no answer and message was left on the voicemail for the patient to call back.  Return phone number given.

## 2019-05-26 NOTE — Telephone Encounter (Addendum)
Pt returned call for results [phone]. Pt states will have phone on and beside her for call back.

## 2019-06-07 ENCOUNTER — Encounter (HOSPITAL_COMMUNITY): Payer: Self-pay | Admitting: *Deleted

## 2019-06-07 ENCOUNTER — Emergency Department (HOSPITAL_COMMUNITY)
Admission: EM | Admit: 2019-06-07 | Discharge: 2019-06-07 | Disposition: A | Discharge status: Home / Self Care | Payer: 59 | Source: Home / Self Care | Attending: Emergency Medicine | Admitting: Emergency Medicine

## 2019-06-07 ENCOUNTER — Other Ambulatory Visit: Payer: Self-pay | Admitting: Family Medicine

## 2019-06-07 DIAGNOSIS — I10 Essential (primary) hypertension: Secondary | ICD-10-CM | POA: Insufficient documentation

## 2019-06-07 DIAGNOSIS — R600 Localized edema: Secondary | ICD-10-CM | POA: Insufficient documentation

## 2019-06-07 DIAGNOSIS — Z79899 Other long term (current) drug therapy: Secondary | ICD-10-CM | POA: Insufficient documentation

## 2019-06-07 DIAGNOSIS — I2510 Atherosclerotic heart disease of native coronary artery without angina pectoris: Secondary | ICD-10-CM | POA: Insufficient documentation

## 2019-06-07 DIAGNOSIS — I214 Non-ST elevation (NSTEMI) myocardial infarction: Secondary | ICD-10-CM | POA: Diagnosis not present

## 2019-06-07 DIAGNOSIS — R220 Localized swelling, mass and lump, head: Secondary | ICD-10-CM

## 2019-06-07 LAB — CBC WITH DIFFERENTIAL/PLATELET
Abs Immature Granulocytes: 0.01 10*3/uL (ref 0.00–0.07)
Basophils Absolute: 0 10*3/uL (ref 0.0–0.1)
Basophils Relative: 0 %
Eosinophils Absolute: 0 10*3/uL (ref 0.0–0.5)
Eosinophils Relative: 0 %
HCT: 40.2 % (ref 36.0–46.0)
Hemoglobin: 12.7 g/dL (ref 12.0–15.0)
Immature Granulocytes: 0 %
Lymphocytes Relative: 27 %
Lymphs Abs: 2.1 10*3/uL (ref 0.7–4.0)
MCH: 27.9 pg (ref 26.0–34.0)
MCHC: 31.6 g/dL (ref 30.0–36.0)
MCV: 88.2 fL (ref 80.0–100.0)
Monocytes Absolute: 0.2 10*3/uL (ref 0.1–1.0)
Monocytes Relative: 3 %
Neutro Abs: 5.4 10*3/uL (ref 1.7–7.7)
Neutrophils Relative %: 70 %
Platelets: 284 10*3/uL (ref 150–400)
RBC: 4.56 MIL/uL (ref 3.87–5.11)
RDW: 12.8 % (ref 11.5–15.5)
WBC: 7.7 10*3/uL (ref 4.0–10.5)
nRBC: 0 % (ref 0.0–0.2)

## 2019-06-07 LAB — COMPREHENSIVE METABOLIC PANEL
ALT: 17 U/L (ref 0–44)
AST: 16 U/L (ref 15–41)
Albumin: 3.7 g/dL (ref 3.5–5.0)
Alkaline Phosphatase: 61 U/L (ref 38–126)
Anion gap: 7 (ref 5–15)
BUN: 14 mg/dL (ref 8–23)
CO2: 26 mmol/L (ref 22–32)
Calcium: 9.2 mg/dL (ref 8.9–10.3)
Chloride: 106 mmol/L (ref 98–111)
Creatinine, Ser: 1.07 mg/dL — ABNORMAL HIGH (ref 0.44–1.00)
GFR calc Af Amer: >60 mL/min (ref >60)
GFR calc non Af Amer: 56 mL/min — ABNORMAL LOW (ref >60)
Glucose, Bld: 174 mg/dL — ABNORMAL HIGH (ref 70–99)
Potassium: 3.8 mmol/L (ref 3.5–5.1)
Sodium: 139 mmol/L (ref 135–145)
Total Bilirubin: 0.6 mg/dL (ref 0.3–1.2)
Total Protein: 7 g/dL (ref 6.5–8.1)

## 2019-06-07 LAB — LIPASE, BLOOD: Lipase: 36 U/L (ref 11–51)

## 2019-06-07 MED ORDER — DIPHENHYDRAMINE HCL 12.5 MG/5ML PO ELIX
25.0000 mg | ORAL_SOLUTION | Freq: Once | ORAL | Status: AC
  Administered 2019-06-07: 16:00:00 25 mg via ORAL
  Filled 2019-06-07: qty 10

## 2019-06-07 MED ORDER — METHYLPREDNISOLONE SODIUM SUCC 125 MG IJ SOLR
125.0000 mg | Freq: Once | INTRAMUSCULAR | Status: AC
  Administered 2019-06-07: 125 mg via INTRAVENOUS
  Filled 2019-06-07: qty 2

## 2019-06-07 MED ORDER — FAMOTIDINE IN NACL 20-0.9 MG/50ML-% IV SOLN
20.0000 mg | Freq: Once | INTRAVENOUS | Status: AC
  Administered 2019-06-07: 20 mg via INTRAVENOUS
  Filled 2019-06-07: qty 50

## 2019-06-07 NOTE — Progress Notes (Signed)
Patient ID: Kimberly Bryant, female   DOB: 05/05/1956, 63 y.o.   MRN: [id number]   Patient status post emergency department visit on 06/07/2019 due to facial swelling which was thought to be due to her use of Zestril/ACE inhibitor.  We will see if patient can come into the office in the next 1 to 2 weeks to meet with clinical pharmacist to have blood pressure check and see if she needs an additional medication for blood pressure control after the discontinuation of Zestril.  She is currently also on amlodipine 10 mg and metoprolol XL 25 mg.

## 2019-06-07 NOTE — ED Provider Notes (Addendum)
Kimberly Bryant is a 63 y.o. female, presenting to the ED with facial swelling beginning yesterday.  HPI from Suella Broad, PA-C: "63 year old female presents with complaint of facial swelling onset yesterday.  Patient notes swelling to her bilateral cheeks, upper lip and lower lip areas.  Patient denies any difficulty breathing or swallowing, denies any dental problems, fevers, chills.  Patient reports compliance with her medications until this problem started, has not taken her medications today, is prescribed lisinopril, no new medications.  Patient also reports right upper abdominal pain, history of GERD, did not take her medication today due to her facial swelling. Denies chest pain or shortness of breath. No other complaints or concerns. Family member provides translation at patient's request, declines use of translator service."  Past Medical History:  Diagnosis Date  . CAD (coronary artery disease)   . Hypertension      Physical Exam  BP 129/65   Pulse 69   Temp 98.2 F (36.8 C) (Oral)   Resp 16   Ht 5\' 4"  (1.626 m)   Wt 89.4 kg   SpO2 98%   BMI 33.81 kg/m   Physical Exam Vitals signs and nursing note reviewed.  Constitutional:      General: She is not in acute distress.    Appearance: She is well-developed. She is not diaphoretic.  HENT:     Head: Normocephalic and atraumatic.     Comments: Patient does have some mild swelling to the face flanking the nose as well as the upper and lower lips.   Dentition appears to be intact and stable.  No trismus or noted abnormal phonation.  Mouth opening to at least 3 finger widths.  Handles oral secretions without difficulty.   No sublingual swelling.  No swelling or tenderness to the submental or submandibular regions.  No swelling or tenderness into the soft tissues of the neck.    Mouth/Throat:     Mouth: Mucous membranes are moist.     Pharynx: Oropharynx is clear.  Eyes:     Conjunctiva/sclera: Conjunctivae normal.  Neck:   Musculoskeletal: Neck supple.  Cardiovascular:     Rate and Rhythm: Normal rate and regular rhythm.     Pulses: Normal pulses.          Radial pulses are 2+ on the right side and 2+ on the left side.       Posterior tibial pulses are 2+ on the right side and 2+ on the left side.     Comments: Tactile temperature in the extremities appropriate and equal bilaterally. Pulmonary:     Effort: Pulmonary effort is normal. No respiratory distress.     Breath sounds: Normal breath sounds.  Abdominal:     Tenderness: There is no guarding.  Musculoskeletal:     Right lower leg: No edema.     Left lower leg: No edema.  Lymphadenopathy:     Cervical: No cervical adenopathy.  Skin:    General: Skin is warm and dry.  Neurological:     Mental Status: She is alert.  Psychiatric:        Mood and Affect: Mood and affect normal.        Speech: Speech normal.        Behavior: Behavior normal.     ED Course/Procedures     Procedures   Abnormal Labs Reviewed  COMPREHENSIVE METABOLIC PANEL - Abnormal; Notable for the following components:      Result Value   Glucose, Bld 174 (*)  Creatinine, Ser 1.07 (*)    GFR calc non Af Amer 56 (*)    All other components within normal limits   BUN  Date Value Ref Range Status  06/07/2019 14 8 - 23 mg/dL Final  16/10/960407/13/2020 12 8 - 27 mg/dL Final  54/09/811912/27/2019 11 8 - 27 mg/dL Final  14/78/295610/30/2019 9 8 - 23 mg/dL Final   Creatinine, Ser  Date Value Ref Range Status  06/07/2019 1.07 (H) 0.44 - 1.00 mg/dL Final  21/30/865707/13/2020 8.461.07 (H) 0.57 - 1.00 mg/dL Final  96/29/528412/27/2019 1.320.97 0.57 - 1.00 mg/dL Final  44/01/027210/30/2019 5.360.94 0.44 - 1.00 mg/dL Final     MDM   Clinical Course as of Jun 07 1951  Sun Jun 07, 2019  [id number] 63 year old female presents with complaint of swelling in her maxillary areas as well as upper lip and lower lip.  Swelling started yesterday, not progressing denies difficulty breathing or swallowing.  On exam patient has mild swelling of upper and  lower lips with swelling noted under her eyelid areas, there is no overlying erythema, do not suspect cellulitis.  She does have any dental complaints or do not suspect dental abscess.  Patient is on lisinopril, consider ACE inhibitor angioedema versus idiopathic angioedema.  Patient reports right upper abdominal pain, abdomen is soft and nontender, she has not taken her medications today including her GERD medicine.  Labs are reassuring including a normal CBC, unremarkable CMP, normal lipase.  Airway appears clear.  No swelling of the tongue or oropharynx.  Patient was given Solu-Medrol, Pepcid, Benadryl.  Care signed out to Yancey FlemingsSean Joy, PA-C, pending recheck swelling, reevaluate airway.  If symptoms are not worsening, patient may be able to be discharged today.  Plan to discontinue her lisinopril and follow-up with PCP.   [LM]    Clinical Course User Index [LM] Jeannie FendMurphy, Laura A, PA-C   Patient care handoff report received from Army MeliaLaura Murphy, PA-C. Plan: Continue to observe patient for any changes.  Suspect patient will likely be discharged.  Patient presents with facial swelling beginning yesterday.  There has been no progression in her swelling since onset.  She actually endorses some improvement today.  She has been on lisinopril for the last several months and this may be the culprit for her facial swelling.  She was asked to discontinue this.  She was also advised to follow-up with her PCP to initiate an alternative medication.  I sent a message to the patient's PCP, Dr. Jillyn HiddenFulp, to bring this issue to her attention and allow for close follow-up. Strict return precautions discussed.  Patient voices understanding of these instructions, accepts the plan, and is comfortable with discharge.  Findings and plan of care discussed with Vanetta MuldersScott Zackowski, MD.   Vitals:   06/07/19 1312 06/07/19 1430 06/07/19 1911  BP: (!) 153/88 129/65 128/74  Pulse: 87 69 86  Resp: 14 16 17   Temp: 98.2 F (36.8 C)  98.1 F  (36.7 C)  TempSrc: Oral    SpO2: 100% 98% 99%  Weight: 89.4 kg    Height: 5\' 4"  (1.626 m)        Anselm PancoastJoy, Shawn C, PA-C 06/07/19 1955    Anselm PancoastJoy, Shawn C, PA-C 06/07/19 1956    Vanetta MuldersZackowski, Scott, MD 06/10/19 [phone]

## 2019-06-07 NOTE — ED Triage Notes (Signed)
To ED for eval of facial swelling that started yesterday. Getting worse. Pt's lips swollen. Denies feeling like her tongue is swollen. resp e/u. Airway patent. Use [employer] interp.

## 2019-06-07 NOTE — ED Notes (Signed)
Pt ambulated to restroom without difficulty to attempt UA. Pt unable to void.

## 2019-06-07 NOTE — ED Notes (Signed)
Pt has angioedema after taking lisinopril.

## 2019-06-07 NOTE — Discharge Instructions (Signed)
We suspect the lisinopril may be responsible for the facial swelling.  Please stop taking lisinopril. Please follow-up with your primary care provider.  You will need to be placed on a different high blood pressure medication. Return to the emergency department for worsening swelling, difficulty breathing, chest pain, swelling to the throat or tongue, or any other major concerns.

## 2019-06-07 NOTE — ED Provider Notes (Signed)
[place] EMERGENCY DEPARTMENT Provider Note   CSN: [id number] Arrival date & time: 06/07/19  1303    History   Chief Complaint Chief Complaint  Patient presents with  . Facial Swelling    HPI Kimberly Bryant is a 63 y.o. female.     63 year old female presents with complaint of facial swelling onset yesterday.  Patient notes swelling to her bilateral cheeks, upper lip and lower lip areas.  Patient denies any difficulty breathing or swallowing, denies any dental problems, fevers, chills.  Patient reports compliance with her medications until this problem started, has not taken her medications today, is prescribed lisinopril, no new medications.  Patient also reports right upper abdominal pain, history of GERD, did not take her medication today due to her facial swelling. Denies chest pain or shortness of breath. No other complaints or concerns. Family member provides translation at patient's request, declines use of translator service.      Past Medical History:  Diagnosis Date  . CAD (coronary artery disease)   . Hypertension     Patient Active Problem List   Diagnosis Date Noted  . CAD (coronary artery disease) 09/15/2018  . HTN (hypertension) 09/15/2018  . GERD (gastroesophageal reflux disease) 09/15/2018  . Chest pain, atypical 09/15/2018  . Hyperlipidemia 09/15/2018    History reviewed. No pertinent surgical history.   OB History   No obstetric history on file.      Home Medications    Prior to Admission medications   Medication Sig Start Date End Date Taking? Authorizing Provider  amLODipine (NORVASC) 10 MG tablet Take 1 tablet (10 mg total) by mouth daily. 05/18/19   Fulp, Cammie, MD  atorvastatin (LIPITOR) 40 MG tablet Take 1 tablet (40 mg total) by mouth daily. 05/18/19   Fulp, Cammie, MD  diclofenac sodium (VOLTAREN) 1 % GEL Apply 2 g topically 4 (four) times daily. To area of pain 05/18/19   Fulp, Cammie, MD  lansoprazole (PREVACID) 30  MG capsule Take 1 capsule (30 mg total) by mouth 2 (two) times daily. To reduce stomach acid 05/18/19   Fulp, Cammie, MD  lisinopril (ZESTRIL) 5 MG tablet Take 1 tablet (5 mg total) by mouth daily. 05/18/19   Fulp, Cammie, MD  metoprolol succinate (TOPROL-XL) 25 MG 24 hr tablet Take 1 tablet (25 mg total) by mouth daily. 05/18/19   Fulp, Cammie, MD  pantoprazole (PROTONIX) 20 MG tablet Take 1 tablet (20 mg total) by mouth 2 (two) times daily before a meal. Patient not taking: Reported on 05/18/2019 09/15/18   [name] Stain, MD  sucralfate (CARAFATE) 1 g tablet Take 1 tablet (1 g total) by mouth 4 (four) times daily -  with meals and at bedtime. 05/18/19   Antony Blackbird, MD    Family History Family History  Problem Relation Age of Onset  . Hypertension Mother     Social History Social History   Tobacco Use  . Smoking status: Never Smoker  . Smokeless tobacco: Never Used  Substance Use Topics  . Alcohol use: Never    Frequency: Never  . Drug use: Never     Allergies   Patient has no known allergies.   Review of Systems Review of Systems  Constitutional: Negative for chills and fever.  HENT: Positive for facial swelling. Negative for congestion and dental problem.   Eyes: Negative for visual disturbance.  Respiratory: Negative for cough and shortness of breath.   Cardiovascular: Negative for chest pain.  Gastrointestinal: Positive for  abdominal pain. Negative for constipation, diarrhea, nausea and vomiting.  Musculoskeletal: Negative for arthralgias and myalgias.  Skin: Negative for color change, rash and wound.  Allergic/Immunologic: Negative for immunocompromised state.  Neurological: Negative for weakness and headaches.  Psychiatric/Behavioral: Negative for confusion.  All other systems reviewed and are negative.    Physical Exam Updated Vital Signs BP 129/65   Pulse 69   Temp 98.2 F (36.8 C) (Oral)   Resp 16   Ht 5\' 4"  (1.626 m)   Wt 89.4 kg   SpO2 98%   BMI  33.81 kg/m   Physical Exam Vitals signs and nursing note reviewed.  Constitutional:      General: She is not in acute distress.    Appearance: She is well-developed. She is not diaphoretic.  HENT:     Head: Atraumatic.     Jaw: No trismus.      Mouth/Throat:     Mouth: Mucous membranes are moist.     Dentition: No dental tenderness or dental abscesses.     Pharynx: Oropharynx is clear. Uvula midline. No pharyngeal swelling or uvula swelling.  Neck:     Musculoskeletal: Neck supple. No muscular tenderness.  Cardiovascular:     Rate and Rhythm: Normal rate and regular rhythm.     Heart sounds: Normal heart sounds.  Pulmonary:     Effort: Pulmonary effort is normal.     Breath sounds: Normal breath sounds.  Skin:    General: Skin is warm and dry.  Neurological:     Mental Status: She is alert and oriented to person, place, and time.  Psychiatric:        Behavior: Behavior normal.      ED Treatments / Results  Labs (all labs ordered are listed, but only abnormal results are displayed) Labs Reviewed  COMPREHENSIVE METABOLIC PANEL - Abnormal; Notable for the following components:      Result Value   Glucose, Bld 174 (*)    Creatinine, Ser 1.07 (*)    GFR calc non Af Amer 56 (*)    All other components within normal limits  CBC WITH DIFFERENTIAL/PLATELET  LIPASE, BLOOD  URINALYSIS, ROUTINE W REFLEX MICROSCOPIC    EKG None  Radiology No results found.  Procedures Procedures (including critical care time)  Medications Ordered in ED Medications  methylPREDNISolone sodium succinate (SOLU-MEDROL) 125 mg/2 mL injection 125 mg (125 mg Intravenous Given 06/07/19 1601)  famotidine (PEPCID) IVPB 20 mg premix (20 mg Intravenous New Bag/Given 06/07/19 1600)  diphenhydrAMINE (BENADRYL) 12.5 MG/5ML elixir 25 mg (25 mg Oral Given 06/07/19 1600)     Initial Impression / Assessment and Plan / ED Course  I have reviewed the triage vital signs and the nursing notes.  Pertinent  labs & imaging results that were available during my care of the patient were reviewed by me and considered in my medical decision making (see chart for details).  Clinical Course as of Jun 07 1631  Wynelle LinkSun Jun 07, 2019  [id number] 63 year old female presents with complaint of swelling in her maxillary areas as well as upper lip and lower lip.  Swelling started yesterday, not progressing denies difficulty breathing or swallowing.  On exam patient has mild swelling of upper and lower lips with swelling noted under her eyelid areas, there is no overlying erythema, do not suspect cellulitis.  She does have any dental complaints or do not suspect dental abscess.  Patient is on lisinopril, consider ACE inhibitor angioedema versus idiopathic angioedema.  Patient reports right  upper abdominal pain, abdomen is soft and nontender, she has not taken her medications today including her GERD medicine.  Labs are reassuring including a normal CBC, unremarkable CMP, normal lipase.  Airway appears clear.  No swelling of the tongue or oropharynx.  Patient was given Solu-Medrol, Pepcid, Benadryl.  Care signed out to Yancey FlemingsSean Joy, PA-C, pending recheck swelling, reevaluate airway.  If symptoms are not worsening, patient may be able to be discharged today.  Plan to discontinue her lisinopril and follow-up with PCP.   [LM]    Clinical Course User Index [LM] Jeannie FendMurphy, Jozsef Wescoat A, PA-C      Final Clinical Impressions(s) / ED Diagnoses   Final diagnoses:  None    ED Discharge Orders    None       Jeannie FendMurphy, Mishell Donalson A, PA-C 06/07/19 1632    Sabas SousBero, Michael M, MD 06/11/19 [phone]

## 2019-06-08 ENCOUNTER — Telehealth: Payer: Self-pay | Admitting: Family Medicine

## 2019-06-08 ENCOUNTER — Emergency Department (HOSPITAL_COMMUNITY): Payer: 59

## 2019-06-08 ENCOUNTER — Inpatient Hospital Stay (HOSPITAL_COMMUNITY)
Admission: EM | Admit: 2019-06-08 | Discharge: 2019-06-16 | DRG: 234 | Disposition: A | Discharge status: Home Health | Payer: Self-pay | Attending: Cardiothoracic Surgery | Admitting: Cardiothoracic Surgery

## 2019-06-08 ENCOUNTER — Encounter (HOSPITAL_COMMUNITY): Payer: Self-pay | Admitting: Internal Medicine

## 2019-06-08 DIAGNOSIS — R0789 Other chest pain: Secondary | ICD-10-CM

## 2019-06-08 DIAGNOSIS — Z20828 Contact with and (suspected) exposure to other viral communicable diseases: Secondary | ICD-10-CM | POA: Diagnosis present

## 2019-06-08 DIAGNOSIS — R778 Other specified abnormalities of plasma proteins: Secondary | ICD-10-CM

## 2019-06-08 DIAGNOSIS — I249 Acute ischemic heart disease, unspecified: Secondary | ICD-10-CM | POA: Diagnosis present

## 2019-06-08 DIAGNOSIS — I25110 Atherosclerotic heart disease of native coronary artery with unstable angina pectoris: Secondary | ICD-10-CM | POA: Diagnosis not present

## 2019-06-08 DIAGNOSIS — Z951 Presence of aortocoronary bypass graft: Secondary | ICD-10-CM

## 2019-06-08 DIAGNOSIS — I10 Essential (primary) hypertension: Secondary | ICD-10-CM | POA: Diagnosis present

## 2019-06-08 DIAGNOSIS — T783XXA Angioneurotic edema, initial encounter: Secondary | ICD-10-CM | POA: Diagnosis present

## 2019-06-08 DIAGNOSIS — R739 Hyperglycemia, unspecified: Secondary | ICD-10-CM | POA: Diagnosis present

## 2019-06-08 DIAGNOSIS — K219 Gastro-esophageal reflux disease without esophagitis: Secondary | ICD-10-CM | POA: Diagnosis present

## 2019-06-08 DIAGNOSIS — I700 Atherosclerosis of aorta: Secondary | ICD-10-CM | POA: Diagnosis present

## 2019-06-08 DIAGNOSIS — I214 Non-ST elevation (NSTEMI) myocardial infarction: Principal | ICD-10-CM | POA: Diagnosis present

## 2019-06-08 DIAGNOSIS — I160 Hypertensive urgency: Secondary | ICD-10-CM | POA: Diagnosis present

## 2019-06-08 DIAGNOSIS — J90 Pleural effusion, not elsewhere classified: Secondary | ICD-10-CM | POA: Diagnosis not present

## 2019-06-08 DIAGNOSIS — I340 Nonrheumatic mitral (valve) insufficiency: Secondary | ICD-10-CM | POA: Diagnosis present

## 2019-06-08 DIAGNOSIS — E785 Hyperlipidemia, unspecified: Secondary | ICD-10-CM | POA: Diagnosis present

## 2019-06-08 DIAGNOSIS — D62 Acute posthemorrhagic anemia: Secondary | ICD-10-CM | POA: Diagnosis not present

## 2019-06-08 DIAGNOSIS — Z01810 Encounter for preprocedural cardiovascular examination: Secondary | ICD-10-CM | POA: Diagnosis not present

## 2019-06-08 DIAGNOSIS — Z8249 Family history of ischemic heart disease and other diseases of the circulatory system: Secondary | ICD-10-CM | POA: Diagnosis not present

## 2019-06-08 DIAGNOSIS — E8770 Fluid overload, unspecified: Secondary | ICD-10-CM | POA: Diagnosis present

## 2019-06-08 DIAGNOSIS — R630 Anorexia: Secondary | ICD-10-CM | POA: Diagnosis present

## 2019-06-08 DIAGNOSIS — I9789 Other postprocedural complications and disorders of the circulatory system, not elsewhere classified: Secondary | ICD-10-CM | POA: Diagnosis not present

## 2019-06-08 DIAGNOSIS — T464X5A Adverse effect of angiotensin-converting-enzyme inhibitors, initial encounter: Secondary | ICD-10-CM | POA: Diagnosis present

## 2019-06-08 DIAGNOSIS — I2582 Chronic total occlusion of coronary artery: Secondary | ICD-10-CM | POA: Diagnosis present

## 2019-06-08 DIAGNOSIS — I2510 Atherosclerotic heart disease of native coronary artery without angina pectoris: Secondary | ICD-10-CM | POA: Diagnosis present

## 2019-06-08 DIAGNOSIS — E7800 Pure hypercholesterolemia, unspecified: Secondary | ICD-10-CM | POA: Diagnosis not present

## 2019-06-08 DIAGNOSIS — R7989 Other specified abnormal findings of blood chemistry: Secondary | ICD-10-CM | POA: Diagnosis not present

## 2019-06-08 DIAGNOSIS — J9811 Atelectasis: Secondary | ICD-10-CM | POA: Diagnosis not present

## 2019-06-08 DIAGNOSIS — Z09 Encounter for follow-up examination after completed treatment for conditions other than malignant neoplasm: Secondary | ICD-10-CM

## 2019-06-08 HISTORY — DX: Hyperlipidemia, unspecified: E78.5

## 2019-06-08 LAB — COMPREHENSIVE METABOLIC PANEL
ALT: 22 U/L (ref 0–44)
AST: 24 U/L (ref 15–41)
Albumin: 4 g/dL (ref 3.5–5.0)
Alkaline Phosphatase: 65 U/L (ref 38–126)
Anion gap: 13 (ref 5–15)
BUN: 17 mg/dL (ref 8–23)
CO2: 21 mmol/L — ABNORMAL LOW (ref 22–32)
Calcium: 9.4 mg/dL (ref 8.9–10.3)
Chloride: 106 mmol/L (ref 98–111)
Creatinine, Ser: 1.15 mg/dL — ABNORMAL HIGH (ref 0.44–1.00)
GFR calc Af Amer: 59 mL/min — ABNORMAL LOW (ref >60)
GFR calc non Af Amer: 51 mL/min — ABNORMAL LOW (ref >60)
Glucose, Bld: 243 mg/dL — ABNORMAL HIGH (ref 70–99)
Potassium: 3.9 mmol/L (ref 3.5–5.1)
Sodium: 140 mmol/L (ref 135–145)
Total Bilirubin: 0.3 mg/dL (ref 0.3–1.2)
Total Protein: 7.7 g/dL (ref 6.5–8.1)

## 2019-06-08 LAB — TROPONIN I (HIGH SENSITIVITY)
Troponin I (High Sensitivity): 221 ng/L (ref <18)
Troponin I (High Sensitivity): 893 ng/L (ref <18)
Troponin I (High Sensitivity): 6097 ng/L (ref <18)
Troponin I (High Sensitivity): 7850 ng/L (ref <18)

## 2019-06-08 LAB — CBC
HCT: 37.7 % (ref 36.0–46.0)
Hemoglobin: 12 g/dL (ref 12.0–15.0)
MCH: 27.6 pg (ref 26.0–34.0)
MCHC: 31.8 g/dL (ref 30.0–36.0)
MCV: 86.7 fL (ref 80.0–100.0)
Platelets: 267 10*3/uL (ref 150–400)
RBC: 4.35 MIL/uL (ref 3.87–5.11)
RDW: 12.8 % (ref 11.5–15.5)
WBC: 16.7 10*3/uL — ABNORMAL HIGH (ref 4.0–10.5)
nRBC: 0 % (ref 0.0–0.2)

## 2019-06-08 LAB — SARS CORONAVIRUS 2 BY RT PCR (HOSPITAL ORDER, PERFORMED IN ~~LOC~~ HOSPITAL LAB): SARS Coronavirus 2: NEGATIVE

## 2019-06-08 LAB — GLUCOSE, CAPILLARY: Glucose-Capillary: 94 mg/dL (ref 70–99)

## 2019-06-08 LAB — MAGNESIUM: Magnesium: 2.3 mg/dL (ref 1.7–2.4)

## 2019-06-08 LAB — LIPASE, BLOOD: Lipase: 24 U/L (ref 11–51)

## 2019-06-08 MED ORDER — PANTOPRAZOLE SODIUM 40 MG PO TBEC
40.0000 mg | DELAYED_RELEASE_TABLET | Freq: Two times a day (BID) | ORAL | Status: DC
  Administered 2019-06-08 – 2019-06-09 (×3): 40 mg via ORAL
  Filled 2019-06-08 (×3): qty 1

## 2019-06-08 MED ORDER — ONDANSETRON HCL 4 MG/2ML IJ SOLN: 4.0000 mg | Freq: Four times a day (QID) | INTRAMUSCULAR | Status: DC | PRN

## 2019-06-08 MED ORDER — HEPARIN BOLUS VIA INFUSION
4000.0000 [IU] | Freq: Once | INTRAVENOUS | Status: AC
  Administered 2019-06-08: 4000 [IU] via INTRAVENOUS
  Filled 2019-06-08: qty 4000

## 2019-06-08 MED ORDER — INSULIN ASPART 100 UNIT/ML ~~LOC~~ SOLN
0.0000 [IU] | SUBCUTANEOUS | Status: DC
  Administered 2019-06-09: 02:00:00 2 [IU] via SUBCUTANEOUS

## 2019-06-08 MED ORDER — METOPROLOL SUCCINATE ER 25 MG PO TB24
25.0000 mg | ORAL_TABLET | Freq: Every day | ORAL | Status: DC
  Administered 2019-06-08 – 2019-06-09 (×2): 25 mg via ORAL
  Filled 2019-06-08 (×2): qty 1

## 2019-06-08 MED ORDER — ASPIRIN EC 81 MG PO TBEC
81.0000 mg | DELAYED_RELEASE_TABLET | Freq: Every day | ORAL | Status: DC
  Administered 2019-06-09: 10:00:00 81 mg via ORAL
  Filled 2019-06-08: qty 1

## 2019-06-08 MED ORDER — ONDANSETRON 4 MG PO TBDP
4.0000 mg | ORAL_TABLET | Freq: Once | ORAL | Status: AC
  Administered 2019-06-08: 4 mg via ORAL
  Filled 2019-06-08: qty 1

## 2019-06-08 MED ORDER — HEPARIN (PORCINE) 25000 UT/250ML-% IV SOLN
900.0000 [IU]/h | INTRAVENOUS | Status: DC
  Administered 2019-06-08: 900 [IU]/h via INTRAVENOUS
  Filled 2019-06-08: qty 250

## 2019-06-08 MED ORDER — FENTANYL CITRATE (PF) 100 MCG/2ML IJ SOLN
100.0000 ug | Freq: Once | INTRAMUSCULAR | Status: AC
  Administered 2019-06-08: 100 ug via INTRAVENOUS
  Filled 2019-06-08: qty 2

## 2019-06-08 MED ORDER — ACETAMINOPHEN 325 MG PO TABS: 650.0000 mg | ORAL_TABLET | ORAL | Status: DC | PRN

## 2019-06-08 MED ORDER — NITROGLYCERIN 0.4 MG SL SUBL: 0.4000 mg | SUBLINGUAL_TABLET | SUBLINGUAL | Status: DC | PRN

## 2019-06-08 MED ORDER — IOHEXOL 350 MG/ML SOLN
100.0000 mL | Freq: Once | INTRAVENOUS | Status: AC | PRN
  Administered 2019-06-08: 100 mL via INTRAVENOUS

## 2019-06-08 MED ORDER — ATORVASTATIN CALCIUM 40 MG PO TABS
40.0000 mg | ORAL_TABLET | Freq: Every day | ORAL | Status: DC
  Administered 2019-06-08: 40 mg via ORAL
  Filled 2019-06-08: qty 1

## 2019-06-08 MED ORDER — SODIUM CHLORIDE 0.9 % IV SOLN
INTRAVENOUS | Status: DC
  Administered 2019-06-08 – 2019-06-10 (×4): via INTRAVENOUS

## 2019-06-08 MED ORDER — AMLODIPINE BESYLATE 10 MG PO TABS
10.0000 mg | ORAL_TABLET | Freq: Every day | ORAL | Status: DC
  Administered 2019-06-08 – 2019-06-09 (×2): 10 mg via ORAL
  Filled 2019-06-08 (×2): qty 1

## 2019-06-08 MED ORDER — SODIUM CHLORIDE 0.9% FLUSH
3.0000 mL | Freq: Once | INTRAVENOUS | Status: AC
  Administered 2019-06-08: 15:00:00 3 mL via INTRAVENOUS

## 2019-06-08 NOTE — H&P (Signed)
History and Physical    Kimberly Bryant:[id number] DOB: 1956-11-04 DOA: 06/08/2019  PCP: Antony Blackbird, MD  Patient coming from: Home  I have personally briefly reviewed patient's old medical records in [place]  Chief Complaint: CP  HPI: Kimberly Bryant is a 63 y.o. 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  Patient seen in ED on 8/2 (yesterday) with angioedema.  Believed to be secondary to ACEi use.  ACEi stopped and patient discharged home.  That evening patient developed CP.  Pain with radiation to upper back and R arm.  Associated Nausea and vomiting when she eats anything.  EMS called.  Initial BP reported at 212/112 that decreased to 146/102 after NTG.   ED Course: CP persistent in the ED despite NTG and BP improvement, associated vomiting while in ED.  Initial trop 221 repeat 893.  EKG without obvious ischemic findings.  CT aortogram without aortic dissection, also no large central PE nor CT evidence of R heart strain.  Facial swelling and itching from yesterday remains resolved.   Review of Systems: As per HPI, otherwise all review of systems negative.  Past Medical History:  Diagnosis Date   CAD (coronary artery disease)    Hyperlipidemia    Hypertension     History reviewed. No pertinent surgical history.  Never had abdominal surgery.   reports that she has never smoked. She has never used smokeless tobacco. She reports that she does not drink alcohol or use drugs.  Allergies  Allergen Reactions   Carafate [Sucralfate] Swelling and Other (See Comments)    Patient stated it made her face swell (angioedema)- no breathing issues, however   Lisinopril Swelling and Other (See Comments)    Patient stated it made her face swell (angioedema)- no breathing issues, however    Family History  Problem Relation Age of Onset   Hypertension Mother      Prior to Admission medications   Medication Sig  Start Date End Date Taking? Authorizing Provider  amLODipine (NORVASC) 10 MG tablet Take 1 tablet (10 mg total) by mouth daily. 05/18/19  Yes Fulp, Cammie, MD  atorvastatin (LIPITOR) 40 MG tablet Take 1 tablet (40 mg total) by mouth daily. 05/18/19  Yes Fulp, Cammie, MD  lansoprazole (PREVACID) 30 MG capsule Take 1 capsule (30 mg total) by mouth 2 (two) times daily. To reduce stomach acid 05/18/19  Yes Fulp, Cammie, MD  metoprolol succinate (TOPROL-XL) 25 MG 24 hr tablet Take 1 tablet (25 mg total) by mouth daily. 05/18/19  Yes Fulp, Cammie, MD  lisinopril (ZESTRIL) 5 MG tablet Take 1 tablet (5 mg total) by mouth daily. 05/18/19 06/07/19  Antony Blackbird, MD    Physical Exam: Vitals:   06/08/19 1700 06/08/19 1845 06/08/19 1915 06/08/19 1921  BP:  (!) 143/91 124/89 (!) 141/82  Pulse:  66 68 (!) 58  Resp:  15 13 16   Temp:      TempSrc:      SpO2:  99% 100% 99%  Weight: 89.4 kg     Height: 5\' 4"  (1.626 m)       Constitutional: NAD, calm, comfortable Eyes: PERRL, lids and conjunctivae normal ENMT: Mucous membranes are moist. Posterior pharynx clear of any exudate or lesions.Normal dentition.  Neck: normal, supple, no masses, no thyromegaly Respiratory: clear to auscultation bilaterally, no wheezing, no crackles. Normal respiratory effort. No accessory muscle use.  Cardiovascular: Regular rate and rhythm, no murmurs / rubs /  gallops. No extremity edema. 2+ pedal pulses. No carotid bruits.  Abdomen: no tenderness, no masses palpated. No hepatosplenomegaly. Bowel sounds positive.  Musculoskeletal: no clubbing / cyanosis. No joint deformity upper and lower extremities. Good ROM, no contractures. Normal muscle tone.  Skin: no rashes, lesions, ulcers. No induration Neurologic: CN 2-12 grossly intact. Sensation intact, DTR normal. Strength 5/5 in all 4.  Psychiatric: Normal judgment and insight. Alert and oriented x 3. Normal mood.    Labs on Admission: I have personally reviewed following labs and  imaging studies  CBC: Recent Labs  Lab 06/07/19 1500 06/08/19 1343  WBC 7.7 16.7*  NEUTROABS 5.4  --   HGB 12.7 12.0  HCT 40.2 37.7  MCV 88.2 86.7  PLT 284 267   Basic Metabolic Panel: Recent Labs  Lab 06/07/19 1500 06/08/19 1343  NA 139 140  K 3.8 3.9  CL 106 106  CO2 26 21*  GLUCOSE 174* 243*  BUN 14 17  CREATININE 1.07* 1.15*  CALCIUM 9.2 9.4   GFR: Estimated Creatinine Clearance: 54.9 mL/min (A) (by C-G formula based on SCr of 1.15 mg/dL (H)). Liver Function Tests: Recent Labs  Lab 06/07/19 1500 06/08/19 1343  AST 16 24  ALT 17 22  ALKPHOS 61 65  BILITOT 0.6 0.3  PROT 7.0 7.7  ALBUMIN 3.7 4.0   Recent Labs  Lab 06/07/19 1500 06/08/19 1343  LIPASE 36 24   No results for input(s): AMMONIA in the last 168 hours. Coagulation Profile: No results for input(s): INR, PROTIME in the last 168 hours. Cardiac Enzymes: No results for input(s): CKTOTAL, CKMB, CKMBINDEX, TROPONINI in the last 168 hours. BNP (last 3 results) No results for input(s): PROBNP in the last 8760 hours. HbA1C: No results for input(s): HGBA1C in the last 72 hours. CBG: No results for input(s): GLUCAP in the last 168 hours. Lipid Profile: No results for input(s): CHOL, HDL, LDLCALC, TRIG, CHOLHDL, LDLDIRECT in the last 72 hours. Thyroid Function Tests: No results for input(s): TSH, T4TOTAL, FREET4, T3FREE, THYROIDAB in the last 72 hours. Anemia Panel: No results for input(s): VITAMINB12, FOLATE, FERRITIN, TIBC, IRON, RETICCTPCT in the last 72 hours. Urine analysis: No results found for: COLORURINE, APPEARANCEUR, LABSPEC, PHURINE, GLUCOSEU, HGBUR, BILIRUBINUR, KETONESUR, PROTEINUR, UROBILINOGEN, NITRITE, LEUKOCYTESUR  Radiological Exams on Admission: [place] Abdomen Limited  Result Date: 06/08/2019 CLINICAL DATA:  Tenderness to palpation in the right upper quadrant EXAM: ULTRASOUND ABDOMEN LIMITED RIGHT UPPER QUADRANT COMPARISON:  None. FINDINGS: Gallbladder: There are 2 echogenic,  shadowing stones located dependently within the gallbladder lumen, the largest measuring 1.2 x 0.7 x 1.1 cm. No gallbladder wall thickening. No sonographic Murphy sign noted by sonographer. Common bile duct: Diameter: 3.5 mm Liver: No focal lesion identified. Within normal limits in parenchymal echogenicity. Portal vein is patent on color Doppler imaging with normal direction of blood flow towards the liver. Other: None. IMPRESSION: Cholelithiasis without sonographic evidence of acute cholecystitis. Electronically Signed   By: Duanne GuessNicholas  Plundo M.D.   On: 06/08/2019 16:03   Dg Chest Portable 1 View  Result Date: 06/08/2019 CLINICAL DATA:  Chest pain, shortness of breath EXAM: PORTABLE CHEST 1 VIEW COMPARISON:  11/10/2018 FINDINGS: The heart size and mediastinal contours are within normal limits. Both lungs are clear. The visualized skeletal structures are unremarkable. IMPRESSION: No acute cardiopulmonary findings. Electronically Signed   By: Duanne GuessNicholas  Plundo M.D.   On: 06/08/2019 14:31   Ct Angio Chest/abd/pel For Dissection W And/or Wo Contrast  Result Date: 06/08/2019 CLINICAL DATA:  Questionable history  of prior thoracic aortic dissection, chest pain radiating to upper back and right arm began last evening. EXAM: CT ANGIOGRAPHY CHEST, ABDOMEN AND PELVIS TECHNIQUE: Multidetector CT imaging through the chest, abdomen and pelvis was performed using the standard protocol during bolus administration of intravenous contrast. Multiplanar reconstructed images and MIPs were obtained and reviewed to evaluate the vascular anatomy. CONTRAST:  100mL OMNIPAQUE IOHEXOL 350 MG/ML SOLN COMPARISON:  Chest radiograph June 08, 2019 abdominal ultrasound June 08, 2019 FINDINGS: CTA CHEST FINDINGS Cardiovascular: Preferential opacification of the thoracic aorta. Evaluation of the aortic root is limited due to extensive cardiac pulsation artifact. No evidence of ascending or descending thoracic aortic aneurysm or dissection. No  periaortic inflammation stranding. Atherosclerotic plaque within the normal caliber aorta. Normal heart size. No pericardial effusion. While the exam is not tailored for evaluation of pulmonary arteries no large central pulmonary embolism identified. Detection of smaller segmental and subsegmental pulmonary emboli is limited T2 both technique and cardiac pulsation artifact. No CT findings of right heart strain. Mediastinum/Nodes: No enlarged mediastinal or axillary lymph nodes. Thyroid gland, trachea, and esophagus demonstrate no significant findings. Lungs/Pleura: No consolidation, features of edema, pneumothorax, or effusion. Basilar in dependent areas of atelectasis. Musculoskeletal: No chest wall mass or suspicious bone lesions identified. Review of the MIP images confirms the above findings. CTA ABDOMEN AND PELVIS FINDINGS VASCULAR Aorta: Normal caliber aorta without aneurysm, dissection, vasculitis or significant stenosis. Celiac: Patent without evidence of aneurysm, dissection, vasculitis or significant stenosis. SMA: Patent without evidence of aneurysm, dissection, vasculitis or significant stenosis. Renals: Both renal arteries are patent without evidence of aneurysm, dissection, vasculitis, fibromuscular dysplasia or significant stenosis. IMA: Atherosclerotic plaque at the ostium with minimal narrowing. Vessel appears patent without evidence of aneurysm, dissection, or vasculitis. Inflow: Crescentic thickening of the wall of the left common iliac artery may reflect eccentric soft plaque versus a small intramural hematoma with several rounded, narrow necked pools of enhancement likely reflecting intramural blood pool. No critical stenosis is identified. The visualized portions of the left internal and external iliac as well as the right iliac system demonstrate atheromatous disease of the native vessels without features of critical stenosis, aneurysm or vasculitis. Veins: No obvious venous abnormality within  the limitations of this arterial phase study. Review of the MIP images confirms the above findings. NON-VASCULAR Hepatobiliary: No focal liver abnormality is seen. No gallstones, gallbladder wall thickening, or biliary dilatation. Pancreas: Unremarkable. No pancreatic ductal dilatation or surrounding inflammatory changes. Spleen: Normal in size without focal abnormality. Adrenals/Urinary Tract: Adrenal glands are unremarkable. Kidneys are normal, without renal calculi, focal lesion, or hydronephrosis. Bladder is unremarkable. Stomach/Bowel: Distal esophagus, stomach and duodenal sweep are unremarkable. No bowel wall thickening or dilatation. No evidence of obstruction. A normal appendix is visualized. Lymphatic: No suspicious or enlarged lymph nodes in the included lymphatic chains. Reproductive: Uterus is deviated slightly rightward but is otherwise unremarkable in appearance. No concerning adnexal lesions. Other: Ventral abdominal diastasis is noted without evidence of mechanical bowel obstruction. No free fluid or free gas within the abdomen or pelvis. Musculoskeletal: Multilevel degenerative changes in the spine are most pronounced at L5-S1. No acute or concerning osseous features. Review of the MIP images confirms the above findings. IMPRESSION: 1. No evidence of acute aortic syndrome. No evidence of aortic dissection. 2. Crescentic thickening of the wall of the left common iliac artery may reflect eccentric soft plaque versus a small intramural hematoma with several foci of intramural blood pool. No critical stenosis is identified. 3. No acute intrathoracic  or intra-abdominal process. 4.  Aortic Atherosclerosis (ICD10-I70.0). These results were called by telephone at the time of interpretation on 06/08/2019 at 6:03 pm to Dr. Freida BusmanAllen, who verbally acknowledged these results. Electronically Signed   By: Kreg ShropshirePrice  DeHay M.D.   On: 06/08/2019 18:05    EKG: Independently reviewed.  Assessment/Plan Principal  Problem:   ACS (acute coronary syndrome) (HCC) Active Problems:   HTN (hypertension)   Hyperlipidemia    1. CP - ACS vs less likely hypertensive urgency with troponin elevation (but symptoms persisted while in ED despite BP being 140s systolic since arrival to ED). 1. ACS pathway 2. Heparin gtt 3. Cards consult, they are at bedside seeing patient now. 4. Serial trops 5. Tele montior 6. NPO after MN 7. ASA, statin 2. HTN - 1. Continue home BP meds 2. Add PRN BB or increase BB dose if needed 3. ACEi believed to cause angioedema this weekend 3. HLD - continue statin 4. Hyperglycemia - BGL 243 1. Sensitive SSI Q4H for now 2. A1C pending  DVT prophylaxis: Heparin gtt Code Status: Full Family Communication: No family in room Disposition Plan: Home after admit Consults called: Cards Admission status: Admit to inpatient  Severity of Illness: The appropriate patient status for this patient is INPATIENT. Inpatient status is judged to be reasonable and necessary in order to provide the required intensity of service to ensure the patient's safety. The patient's presenting symptoms, physical exam findings, and initial radiographic and laboratory data in the context of their chronic comorbidities is felt to place them at high risk for further clinical deterioration. Furthermore, it is not anticipated that the patient will be medically stable for discharge from the hospital within 2 midnights of admission. The following factors support the patient status of inpatient.   IP status for treatment of ACS / CP with significant troponin elevation.   * I certify that at the point of admission it is my clinical judgment that the patient will require inpatient hospital care spanning beyond 2 midnights from the point of admission due to high intensity of service, high risk for further deterioration and high frequency of surveillance required.*    Aoife Bold M. DO [facility]  How to  contact the [facility] Attending or Consulting provider 7A - 7P or covering provider during after hours 7P -7A, for this patient?  1. Check the care team in [facility] and look for a) attending/consulting [facility] provider listed and b) the [facility] team listed 2. Log into [email or web address]  Amion Physician Scheduling and messaging for groups and whole hospitals  On call and physician scheduling software for group practices, residents, hospitalists and other medical providers for call, clinic, rotation and shift schedules. On[employer] is a hospital-wide system for scheduling doctors and paging doctors on call. EasyPlot is for scientific plotting and data analysis.  [email or web address]  and use [employer] universal password to access. If you do not have the password, please contact the hospital operator.  3. Locate the [facility] provider you are looking for under [facility] and page to a number that you can be directly reached. 4. If you still have difficulty reaching the provider, please page the [facility] (Director on Call) for the Hospitalists listed on amion for assistance.  06/08/2019, 7:42 PM

## 2019-06-08 NOTE — ED Provider Notes (Signed)
[facility]   CSN: [id number] Arrival date & time: 06/08/19  1335     History   Chief Complaint Chief Complaint  Patient presents with  . Chest Pain  . Shoulder Pain    HPI Kimberly Bryant is a 63 y.o. female.     HPI  The patient is a 63 year old female, she is [place], she speaks [place] and presents today with a complaint of right-sided chest pain, right upper abdominal pain with radiation into the shoulder and the back of the shoulder blade.  She has had some nausea and vomiting, she has never had abdominal surgery but states this pain seems to get worse when she eats.  Strangely the patient was seen yesterday during which time she seemed to have a reaction to her ACE inhibitor and as this was thought to be related to angioedema that is what she was treated for.  Her itching got significantly better, the facial swelling got better, she now feels like that is almost completely gone but is unfortunately struggling with having this discomfort in her chest.  She denies shortness of breath, denies fevers, denies nausea vomiting or diarrhea.  She denies any prior abdominal surgery and as far she knows has never had any problem with her heart.  At this time the pain is persistent, the patient has been vomiting in the room  Past Medical History:  Diagnosis Date  . CAD (coronary artery disease)   . Hypertension     Patient Active Problem List   Diagnosis Date Noted  . CAD (coronary artery disease) 09/15/2018  . HTN (hypertension) 09/15/2018  . GERD (gastroesophageal reflux disease) 09/15/2018  . Chest pain, atypical 09/15/2018  . Hyperlipidemia 09/15/2018    No past surgical history on file.   OB History   No obstetric history on file.      Home Medications    Prior to Admission medications   Medication Sig Start Date End Date Taking? Authorizing Provider  amLODipine (NORVASC) 10 MG tablet Take 1 tablet (10 mg total) by  mouth daily. 05/18/19   Fulp, Cammie, MD  atorvastatin (LIPITOR) 40 MG tablet Take 1 tablet (40 mg total) by mouth daily. 05/18/19   Fulp, Cammie, MD  diclofenac sodium (VOLTAREN) 1 % GEL Apply 2 g topically 4 (four) times daily. To area of pain 05/18/19   Fulp, Cammie, MD  lansoprazole (PREVACID) 30 MG capsule Take 1 capsule (30 mg total) by mouth 2 (two) times daily. To reduce stomach acid 05/18/19   Fulp, Cammie, MD  metoprolol succinate (TOPROL-XL) 25 MG 24 hr tablet Take 1 tablet (25 mg total) by mouth daily. 05/18/19   Fulp, Cammie, MD  pantoprazole (PROTONIX) 20 MG tablet Take 1 tablet (20 mg total) by mouth 2 (two) times daily before a meal. Patient not taking: Reported on 05/18/2019 09/15/18   Storm FriskWright, Patrick E, MD  sucralfate (CARAFATE) 1 g tablet Take 1 tablet (1 g total) by mouth 4 (four) times daily -  with meals and at bedtime. 05/18/19   Fulp, Cammie, MD  lisinopril (ZESTRIL) 5 MG tablet Take 1 tablet (5 mg total) by mouth daily. 05/18/19 06/07/19  Cain SaupeFulp, Cammie, MD    Family History Family History  Problem Relation Age of Onset  . Hypertension Mother     Social History Social History   Tobacco Use  . Smoking status: Never Smoker  . Smokeless tobacco: Never Used  Substance Use Topics  . Alcohol use: Never  Frequency: Never  . Drug use: Never     Allergies   Patient has no known allergies.   Review of Systems Review of Systems  All other systems reviewed and are negative.    Physical Exam Updated Vital Signs BP (!) 163/93 (BP Location: Right Arm)   Pulse (!) 114   Temp 97.8 F (36.6 C) (Oral)   Resp 12   SpO2 100%   Physical Exam Vitals signs and nursing note reviewed.  Constitutional:      General: She is not in acute distress.    Appearance: She is well-developed.     Comments: Uncomfortable appearing  HENT:     Head: Normocephalic and atraumatic.     Mouth/Throat:     Pharynx: No oropharyngeal exudate.  Eyes:     General: No scleral icterus.        Right eye: No discharge.        Left eye: No discharge.     Conjunctiva/sclera: Conjunctivae normal.     Pupils: Pupils are equal, round, and reactive to light.  Neck:     Musculoskeletal: Normal range of motion and neck supple.     Thyroid: No thyromegaly.     Vascular: No JVD.  Cardiovascular:     Rate and Rhythm: Regular rhythm. Tachycardia present.     Heart sounds: Normal heart sounds. No murmur. No friction rub. No gallop.      Comments: Tachycardic to 105, strong pulses, no edema or JVD Pulmonary:     Effort: Pulmonary effort is normal. No respiratory distress.     Breath sounds: Normal breath sounds. No wheezing or rales.  Chest:     Chest wall: No tenderness.  Abdominal:     General: Bowel sounds are normal. There is no distension.     Palpations: Abdomen is soft. There is no mass.     Tenderness: There is abdominal tenderness.     Comments: No hepatosplenomegaly.  The patient does have right upper quadrant and epigastric tenderness.  No Murphy sign, no lower abdominal tenderness  Musculoskeletal: Normal range of motion.        General: No tenderness.  Lymphadenopathy:     Cervical: No cervical adenopathy.  Skin:    General: Skin is warm and dry.     Findings: No erythema or rash.  Neurological:     Mental Status: She is alert.     Coordination: Coordination normal.  Psychiatric:        Behavior: Behavior normal.      ED Treatments / Results  Labs (all labs ordered are listed, but only abnormal results are displayed) Labs Reviewed  CBC  COMPREHENSIVE METABOLIC PANEL  LIPASE, BLOOD  TROPONIN I (HIGH SENSITIVITY)    EKG EKG Interpretation  Date/Time:  Monday June 08 2019 13:42:25 EDT Ventricular Rate:  104 PR Interval:    QRS Duration: 72 QT Interval:  357 QTC Calculation: 470 R Axis:   75 Text Interpretation:  Sinus tachycardia Biatrial enlargement LVH with secondary repolarization abnormality Since last tracing rate faster and ST depressions  diffusly seen Confirmed by Eber HongMiller, Kaevon Cotta ([id number]) on 06/08/2019 1:58:22 PM   Radiology No results found.  Procedures .Critical Care Performed by: Eber HongMiller, Mariafernanda Hendricksen, MD Authorized by: Eber HongMiller, Zahli Vetsch, MD   Critical care provider statement:    Critical care time (minutes):  35   Critical care time was exclusive of:  Separately billable procedures and treating other patients and teaching time   Critical care was necessary to  treat or prevent imminent or life-threatening deterioration of the following conditions:  Cardiac failure   Critical care was time spent personally by me on the following activities:  Blood draw for specimens, development of treatment plan with patient or surrogate, discussions with consultants, evaluation of patient's response to treatment, examination of patient, obtaining history from patient or surrogate, ordering and performing treatments and interventions, ordering and review of laboratory studies, ordering and review of radiographic studies, pulse oximetry, re-evaluation of patient's condition and review of old charts   (including critical care time)  Medications Ordered in ED Medications  sodium chloride flush (NS) 0.9 % injection 3 mL (has no administration in time range)  ondansetron (ZOFRAN-ODT) disintegrating tablet 4 mg (has no administration in time range)     Initial Impression / Assessment and Plan / ED Course  I have reviewed the triage vital signs and the nursing notes.  Pertinent labs & imaging results that were available during my care of the patient were reviewed by me and considered in my medical decision making (see chart for details).        The patient does have an abnormal EKG with diffuse ST abnormalities which does seem to be new however her pain also seems to be right upper quadrant and epigastric.  In addition to a troponin we will also obtain a metabolic panel including liver function and a lipase, the patient will have an ultrasound and may  need further testing or evaluation if that is negative for pulmonary embolism given her right-sided pain with tachycardia.  The patient has an elevated troponin of 221, this is consistent with some type of ischemia, with the patient having a pleuritic type pain on the right side of her chest radiating to her shoulder blade and exhibiting a tachycardia there is some concern for pulmonary embolism or aortic dissection.  CT scan will be ordered.  Patient is agreeable to the plan  Change of shift, care signed out to Dr. Zenia Resides to follow-up results and disposition accordingly  At the minimum this patient has had a non-ST elevation myocardial infarction, CT scans have been ordered to further evaluate for other potential causes such as pulmonary embolism or aortic dissection though less likely could be entertained given the patient's tachycardia.  The patient will need to be admitted to the hospital with cardiology consultation and a higher level of care.  Critical care was provided  Final Clinical Impressions(s) / ED Diagnoses   Final diagnoses:  NSTEMI (non-ST elevated myocardial infarction) [facility])    ED Discharge Orders    None       Noemi Chapel, MD 06/09/19 2040

## 2019-06-08 NOTE — Telephone Encounter (Signed)
Pt family called to inform provider pt is having extreme abdominal pain and vomiting & taking pt to hospital.

## 2019-06-08 NOTE — ED Notes (Addendum)
Attempted Report to the floor

## 2019-06-08 NOTE — Progress Notes (Signed)
ANTICOAGULATION CONSULT NOTE - Initial Consult  Pharmacy Consult for Heparin IV Indication: chest pain/ACS  No Known Allergies  Patient Measurements: Height: 5\' 4"  (162.6 cm)(From ED visit 06/07/19) Weight: 197 lb 1.5 oz (89.4 kg)(From ED visit 06/07/19) IBW/kg (Calculated) : 54.7 Heparin Dosing Weight: 74.7 kg  Vital Signs: Temp: 97.8 F (36.6 C) (08/03 1353) Temp Source: Oral (08/03 1353) BP: 148/83 (08/03 1545) Pulse Rate: 75 (08/03 1545)  Labs: Recent Labs    06/07/19 1500 06/08/19 1343 06/08/19 1630  HGB 12.7 12.0  --   HCT 40.2 37.7  --   PLT 284 267  --   CREATININE 1.07* 1.15*  --   TROPONINIHS  --  221* 893*    Estimated Creatinine Clearance: 54.9 mL/min (A) (by C-G formula based on SCr of 1.15 mg/dL (H)).   Medical History: Past Medical History:  Diagnosis Date  . CAD (coronary artery disease)   . Hypertension     Assessment: 63 year old female with history of CAD and HTN who presents to ED with chest pain, abdominal pain, and nausea. Of note, she was seen in ED yesterday with angioedema and instructed to stop lisinopril. Patient was not on anticoagulants prior to admission.   H/H/Plts are all within normal limits.  Trop High Sensitivity has increased form 221 to 893.  Pharmacy has been consulted to start IV Heparin for ACS.   Goal of Therapy:  Heparin level 0.3-0.7 units/ml Monitor platelets by anticoagulation protocol: Yes   Plan:  Heparin bolus of 4000 units IV x1.  Heparin drip at 900 units/hr. Heparin level in 6 hours Daily Heparin level and CBC while on therapy.   Sloan Leiter, PharmD, BCPS, BCCCP Clinical Pharmacist Please refer to [facility] for [place] numbers 06/08/2019,5:52 PM

## 2019-06-08 NOTE — ED Provider Notes (Signed)
Patient signed to me by Dr. Sabra Heck pending results of CT angios chest as well as troponins.  Patient's repeat troponin has gone up and she started on heparin.  CT of the chest negative for dissection.  Will consult cardiology for admission she is currently pain-free at this time.  Will recheck EKG   Lacretia Leigh, MD 06/08/19 1756

## 2019-06-08 NOTE — ED Triage Notes (Addendum)
Pt here from home via [facility] for R sided chest pain/epigastric pain that radiates to R shoulder.  Was given 2 sl nitro with some relief en-route as well as 324 asa and 4 zofran.  Initial bp 212/112 that decreased to 146/102 after nitro.  Hr 112, rr 20, temp 99.3 sats 99% ra.  Interpreter used- pt states chest pain, pain to upper back and right arm that started last night. Pt states when she eats something she throws up. Pt was here yesterday for angioedema from lisinopril, told to stop. States she did not take it once she left.

## 2019-06-08 NOTE — ED Notes (Addendum)
ED TO INPATIENT HANDOFF REPORT  ED Nurse Name and Phone #: Hassan Rowan RN  S Name/Age/Gender Kimberly Bryant 63 y.o. female Room/Bed: 016C/016C  Code Status   Code Status: Full Code  Home/SNF/Other Home Patient oriented to: self, place, time and situation Is this baseline? Yes   Triage Complete: Triage complete  Chief Complaint CP  Triage Note Pt here from home via [facility] for R sided chest pain/epigastric pain that radiates to R shoulder.  Was given 2 sl nitro with some relief en-route as well as 324 asa and 4 zofran.  Initial bp 212/112 that decreased to 146/102 after nitro.  Hr 112, rr 20, temp 99.3 sats 99% ra.  Interpreter used- pt states chest pain, pain to upper back and right arm that started last night. Pt states when she eats something she throws up. Pt was here yesterday for angioedema from lisinopril, told to stop. States she did not take it once she left.    Allergies Allergies  Allergen Reactions  . Carafate [Sucralfate] Swelling and Other (See Comments)    Patient stated it made her face swell (angioedema)- no breathing issues, however  . Lisinopril Swelling and Other (See Comments)    Patient stated it made her face swell (angioedema)- no breathing issues, however    Level of Care/Admitting Diagnosis ED Disposition    ED Disposition Condition [place]: [place] [[id number]]  Level of Care: Progressive [102]  Covid Evaluation: Asymptomatic Screening Protocol (No Symptoms)  Diagnosis: ACS (acute coronary syndrome) [facility] [[id number]]  Admitting Physician: Doreatha Massed  Attending Physician: Etta Quill [phone]  Estimated length of stay: past midnight tomorrow  Certification:: I certify this patient will need inpatient services for at least 2 midnights  PT Class (Do Not Modify): Inpatient [101]  PT Acc Code (Do Not Modify): Private [1]       B Medical/Surgery History Past Medical History:  Diagnosis Date  .  CAD (coronary artery disease)   . Hypertension    No past surgical history on file.   A IV Location/Drains/Wounds Patient Lines/Drains/Airways Status   Active Line/Drains/Airways    Name:   Placement date:   Placement time:   Site:   Days:   Peripheral IV 06/08/19 Right Antecubital   06/08/19    -    Antecubital   less than 1   Peripheral IV 06/08/19 Right Antecubital   06/08/19    1351    Antecubital   less than 1          Intake/Output Last 24 hours No intake or output data in the 24 hours ending 06/08/19 1928  Labs/Imaging Results for orders placed or performed during the hospital encounter of 06/08/19 (from the past 48 hour(s))  CBC     Status: Abnormal   Collection Time: 06/08/19  1:43 PM  Result Value Ref Range   WBC 16.7 (H) 4.0 - 10.5 K/uL   RBC 4.35 3.87 - 5.11 MIL/uL   Hemoglobin 12.0 12.0 - 15.0 g/dL   HCT 37.7 36.0 - 46.0 %   MCV 86.7 80.0 - 100.0 fL   MCH 27.6 26.0 - 34.0 pg   MCHC 31.8 30.0 - 36.0 g/dL   RDW 12.8 11.5 - 15.5 %   Platelets 267 150 - 400 K/uL   nRBC 0.0 0.0 - 0.2 %    Comment: Performed at [facility], [place] [place], [place] [address]  Troponin I (High  Sensitivity)     Status: Abnormal   Collection Time: 06/08/19  1:43 PM  Result Value Ref Range   Troponin I (High Sensitivity) 221 (HH) <18 ng/L    Comment: CRITICAL RESULT CALLED TO, READ BACK BY AND VERIFIED WITH: B.MICHAELSON,RN 1504 06/08/2019 CLARK,S (NOTE) Elevated high sensitivity troponin I (hsTnI) values and significant  changes across serial measurements may suggest ACS but many other  chronic and acute conditions are known to elevate hsTnI results.  Refer to the Links section for chest pain algorithms and additional  guidance. Performed at [facility], [address] [place], [place] [address]   Comprehensive metabolic panel     Status: Abnormal   Collection Time: 06/08/19  1:43 PM  Result Value Ref Range   Sodium 140 135 - 145 mmol/L   Potassium 3.9  3.5 - 5.1 mmol/L   Chloride 106 98 - 111 mmol/L   CO2 21 (L) 22 - 32 mmol/L   Glucose, Bld 243 (H) 70 - 99 mg/dL   BUN 17 8 - 23 mg/dL   Creatinine, Ser 6.041.15 (H) 0.44 - 1.00 mg/dL   Calcium 9.4 8.9 - 54.010.3 mg/dL   Total Protein 7.7 6.5 - 8.1 g/dL   Albumin 4.0 3.5 - 5.0 g/dL   AST 24 15 - 41 U/L   ALT 22 0 - 44 U/L   Alkaline Phosphatase 65 38 - 126 U/L   Total Bilirubin 0.3 0.3 - 1.2 mg/dL   GFR calc non Af Amer 51 (L) >60 mL/min   GFR calc Af Amer 59 (L) >60 mL/min   Anion gap 13 5 - 15    Comment: Performed at [facility], [address] [place], [place] [address]  Lipase, blood     Status: None   Collection Time: 06/08/19  1:43 PM  Result Value Ref Range   Lipase 24 11 - 51 U/L    Comment: Performed at [facility], [address] [place], [place] [address]  Troponin I (High Sensitivity)     Status: Abnormal   Collection Time: 06/08/19  4:30 PM  Result Value Ref Range   Troponin I (High Sensitivity) 893 (HH) <18 ng/L    Comment: CRITICAL VALUE NOTED.  VALUE IS CONSISTENT WITH PREVIOUSLY REPORTED AND CALLED VALUE. (NOTE) Elevated high sensitivity troponin I (hsTnI) values and significant  changes across serial measurements may suggest ACS but many other  chronic and acute conditions are known to elevate hsTnI results.  Refer to the Links section for chest pain algorithms and additional  guidance. Performed at [facility], [address] [place], [place] [address]    [place] Abdomen Limited  Result Date: 06/08/2019 CLINICAL DATA:  Tenderness to palpation in the right upper quadrant EXAM: ULTRASOUND ABDOMEN LIMITED RIGHT UPPER QUADRANT COMPARISON:  None. FINDINGS: Gallbladder: There are 2 echogenic, shadowing stones located dependently within the gallbladder lumen, the largest measuring 1.2 x 0.7 x 1.1 cm. No gallbladder wall thickening. No sonographic Murphy sign noted by sonographer. Common bile duct: Diameter: 3.5 mm Liver: No focal lesion identified. Within  normal limits in parenchymal echogenicity. Portal vein is patent on color Doppler imaging with normal direction of blood flow towards the liver. Other: None. IMPRESSION: Cholelithiasis without sonographic evidence of acute cholecystitis. Electronically Signed   By: Duanne GuessNicholas  Plundo M.D.   On: 06/08/2019 16:03   Dg Chest Portable 1 View  Result Date: 06/08/2019 CLINICAL DATA:  Chest pain, shortness of breath EXAM: PORTABLE  CHEST 1 VIEW COMPARISON:  11/10/2018 FINDINGS: The heart size and mediastinal contours are within normal limits. Both lungs are clear. The visualized skeletal structures are unremarkable. IMPRESSION: No acute cardiopulmonary findings. Electronically Signed   By: Duanne GuessNicholas  Plundo M.D.   On: 06/08/2019 14:31   Ct Angio Chest/abd/pel For Dissection W And/or Wo Contrast  Result Date: 06/08/2019 CLINICAL DATA:  Questionable history of prior thoracic aortic dissection, chest pain radiating to upper back and right arm began last evening. EXAM: CT ANGIOGRAPHY CHEST, ABDOMEN AND PELVIS TECHNIQUE: Multidetector CT imaging through the chest, abdomen and pelvis was performed using the standard protocol during bolus administration of intravenous contrast. Multiplanar reconstructed images and MIPs were obtained and reviewed to evaluate the vascular anatomy. CONTRAST:  100mL OMNIPAQUE IOHEXOL 350 MG/ML SOLN COMPARISON:  Chest radiograph June 08, 2019 abdominal ultrasound June 08, 2019 FINDINGS: CTA CHEST FINDINGS Cardiovascular: Preferential opacification of the thoracic aorta. Evaluation of the aortic root is limited due to extensive cardiac pulsation artifact. No evidence of ascending or descending thoracic aortic aneurysm or dissection. No periaortic inflammation stranding. Atherosclerotic plaque within the normal caliber aorta. Normal heart size. No pericardial effusion. While the exam is not tailored for evaluation of pulmonary arteries no large central pulmonary embolism identified. Detection of  smaller segmental and subsegmental pulmonary emboli is limited T2 both technique and cardiac pulsation artifact. No CT findings of right heart strain. Mediastinum/Nodes: No enlarged mediastinal or axillary lymph nodes. Thyroid gland, trachea, and esophagus demonstrate no significant findings. Lungs/Pleura: No consolidation, features of edema, pneumothorax, or effusion. Basilar in dependent areas of atelectasis. Musculoskeletal: No chest wall mass or suspicious bone lesions identified. Review of the MIP images confirms the above findings. CTA ABDOMEN AND PELVIS FINDINGS VASCULAR Aorta: Normal caliber aorta without aneurysm, dissection, vasculitis or significant stenosis. Celiac: Patent without evidence of aneurysm, dissection, vasculitis or significant stenosis. SMA: Patent without evidence of aneurysm, dissection, vasculitis or significant stenosis. Renals: Both renal arteries are patent without evidence of aneurysm, dissection, vasculitis, fibromuscular dysplasia or significant stenosis. IMA: Atherosclerotic plaque at the ostium with minimal narrowing. Vessel appears patent without evidence of aneurysm, dissection, or vasculitis. Inflow: Crescentic thickening of the wall of the left common iliac artery may reflect eccentric soft plaque versus a small intramural hematoma with several rounded, narrow necked pools of enhancement likely reflecting intramural blood pool. No critical stenosis is identified. The visualized portions of the left internal and external iliac as well as the right iliac system demonstrate atheromatous disease of the native vessels without features of critical stenosis, aneurysm or vasculitis. Veins: No obvious venous abnormality within the limitations of this arterial phase study. Review of the MIP images confirms the above findings. NON-VASCULAR Hepatobiliary: No focal liver abnormality is seen. No gallstones, gallbladder wall thickening, or biliary dilatation. Pancreas: Unremarkable. No  pancreatic ductal dilatation or surrounding inflammatory changes. Spleen: Normal in size without focal abnormality. Adrenals/Urinary Tract: Adrenal glands are unremarkable. Kidneys are normal, without renal calculi, focal lesion, or hydronephrosis. Bladder is unremarkable. Stomach/Bowel: Distal esophagus, stomach and duodenal sweep are unremarkable. No bowel wall thickening or dilatation. No evidence of obstruction. A normal appendix is visualized. Lymphatic: No suspicious or enlarged lymph nodes in the included lymphatic chains. Reproductive: Uterus is deviated slightly rightward but is otherwise unremarkable in appearance. No concerning adnexal lesions. Other: Ventral abdominal diastasis is noted without evidence of mechanical bowel obstruction. No free fluid or free gas within the abdomen or pelvis. Musculoskeletal: Multilevel degenerative changes in the spine are most pronounced at L5-S1.  No acute or concerning osseous features. Review of the MIP images confirms the above findings. IMPRESSION: 1. No evidence of acute aortic syndrome. No evidence of aortic dissection. 2. Crescentic thickening of the wall of the left common iliac artery may reflect eccentric soft plaque versus a small intramural hematoma with several foci of intramural blood pool. No critical stenosis is identified. 3. No acute intrathoracic or intra-abdominal process. 4.  Aortic Atherosclerosis (ICD10-I70.0). These results were called by telephone at the time of interpretation on 06/08/2019 at 6:03 pm to Dr. Freida Busman, who verbally acknowledged these results. Electronically Signed   By: Kreg Shropshire M.D.   On: 06/08/2019 18:05    Pending Labs Unresulted Labs (From admission, onward)    Start     Ordered   06/10/19 0500  CBC  Daily,   R     06/08/19 1801   06/10/19 0500  Heparin level (unfractionated)  Daily,   R     06/08/19 1801   06/09/19 0500  Hemoglobin A1c  Tomorrow morning,   R     06/08/19 1916   06/09/19 0500  CBC  Tomorrow morning,    R     06/08/19 1923   06/09/19 0500  Basic metabolic panel  Tomorrow morning,   R     06/08/19 1923   06/09/19 0100  Heparin level (unfractionated)  Once-Timed,   STAT     06/08/19 1800   06/09/19 0100  CBC  Once-Timed,   STAT     06/08/19 1800   06/08/19 1922  HIV antibody (Routine Testing)  Once,   STAT     06/08/19 1923   06/08/19 1845  Magnesium  Once,   STAT     06/08/19 1844   06/08/19 1814  SARS Coronavirus 2 [facility] order, Performed in [facility] hospital lab) Nasopharyngeal Nasopharyngeal Swab  (Asymptomatic Patients Labs)  Once,   STAT    Question Answer Comment  Is this test for diagnosis or screening Screening   Symptomatic for COVID-19 as defined by CDC No   Hospitalized for COVID-19 No   Admitted to ICU for COVID-19 No   Previously tested for COVID-19 No   Resident in a congregate (group) care setting No   Employed in healthcare setting No   Pregnant No      06/08/19 1813          Vitals/Pain Today's Vitals   06/08/19 1845 06/08/19 1845 06/08/19 1915 06/08/19 1921  BP: (!) 143/91  124/89 (!) 141/82  Pulse: 66  68 (!) 58  Resp: 15  13 16   Temp:      TempSrc:      SpO2: 99%  100% 99%  Weight:      Height:      PainSc:  0-No pain      Isolation Precautions No active isolations  Medications Medications  0.9 %  sodium chloride infusion ( Intravenous New Bag/Given 06/08/19 1519)  heparin ADULT infusion 100 units/mL (25000 units/227mL sodium chloride 0.45%) (900 Units/hr Intravenous New Bag/Given 06/08/19 1839)  insulin aspart (novoLOG) injection 0-9 Units (has no administration in time range)  amLODipine (NORVASC) tablet 10 mg (has no administration in time range)  atorvastatin (LIPITOR) tablet 40 mg (has no administration in time range)  metoprolol succinate (TOPROL-XL) 24 hr tablet 25 mg (has no administration in time range)  pantoprazole (PROTONIX) EC tablet 40 mg (has no administration in time range)  aspirin EC tablet 81 mg (has no administration  in time range)  nitroGLYCERIN (NITROSTAT) SL tablet 0.4 mg (has no administration in time range)  acetaminophen (TYLENOL) tablet 650 mg (has no administration in time range)  ondansetron (ZOFRAN) injection 4 mg (has no administration in time range)  sodium chloride flush (NS) 0.9 % injection 3 mL (3 mLs Intravenous Given 06/08/19 1515)  ondansetron (ZOFRAN-ODT) disintegrating tablet 4 mg (4 mg Oral Given 06/08/19 1513)  fentaNYL (SUBLIMAZE) injection 100 mcg (100 mcg Intravenous Given 06/08/19 1503)  iohexol (OMNIPAQUE) 350 MG/ML injection 100 mL (100 mLs Intravenous Contrast Given 06/08/19 1706)  heparin bolus via infusion 4,000 Units (4,000 Units Intravenous Bolus from Bag 06/08/19 1845)    Mobility walks     Focused Assessments Cardiac Assessment Handoff:  Cardiac Rhythm: Sinus tachycardia No results found for: CKTOTAL, CKMB, CKMBINDEX, TROPONINI No results found for: DDIMER Does the Patient currently have chest pain? No      R Recommendations: See Admitting Provider Note  Report given to: Dilcia, RN  Additional Notes:

## 2019-06-08 NOTE — Consult Note (Signed)
Cardiology Consultation:   Patient ID: Kimberly Bryant MRN: [id number]; DOB: 03/07/1956  Admit date: 06/08/2019 Date of Consult: 06/08/2019  Primary Care Provider: Cain SaupeFulp, Cammie, MD Primary Cardiologist: No primary care provider on file.  Primary Electrophysiologist:  None    Patient Profile:   Kimberly Bryant is a 63 y.o. female with a hx of  hypertension, hyperlipidema, and GERD who is being seen today for the evaluation of chest pain at the request of Dr. Freida BusmanAllen.  History of Present Illness:   Patient needs interpreter. Patient has no previous cardiac history or significant cardiac procedures.  Patient was seen 8/2 20 in the ED for angioedema due to Lisinopril. She had facial swelling with no chest pain or sob. Reported RUQ pain as well. HS troponin 221. No EKG. Labs were otherwise reassuring.  She was given Solumedrol, pepcid and benadryl and sent home.   [name] returned to the ER 8/3 for R sided pain/epigastric pain that radiates to R shoulder. Patient says pain is not new. Says it has been worsening over the last year. She said she was going to see a specialist later this month but ended up in the ED before the appointment. Pain is worse with eating and walking. Pain is intermittent and nothing specific seems to precipitate it. Nothing seems to make the pain better. Patient has not taken Tylenol or ibuprofen for pain. Denies associated SOB with pain.   In the ER patient was given 2sl Nitro with some relief. She also received 324 ASA and 4 Zofran. B/P 212/112 that decreased to 146/102 after nitro. HS 112, RR20, temp 99.3, 99% RA. HS troponin 221>893. K+ 3.9, Glucose 243, Creatinine 1.15, WBC 7.7 > 16.7, hgb 12.0. EKG showed Sinus tachycardia 104, ST depression inferolateral leads.CT angio chest/abd with no evidence of acute aortic syndrome or dissection, crescentic thickening of the wall of the left common iliac artery; no critical stenosis. No acute intrathoracic or intra-abdomibnal process,  positive for aortic atherosclerosis.  Patient denies alcohol/tobacco/drug use. No significant family history. She works full-time.   Heart Pathway Score:     Past Medical History:  Diagnosis Date   CAD (coronary artery disease)    Hypertension     No past surgical history on file.   Home Medications:  Prior to Admission medications   Medication Sig Start Date End Date Taking? Authorizing Provider  amLODipine (NORVASC) 10 MG tablet Take 1 tablet (10 mg total) by mouth daily. 05/18/19  Yes Fulp, Cammie, MD  atorvastatin (LIPITOR) 40 MG tablet Take 1 tablet (40 mg total) by mouth daily. 05/18/19  Yes Fulp, Cammie, MD  lansoprazole (PREVACID) 30 MG capsule Take 1 capsule (30 mg total) by mouth 2 (two) times daily. To reduce stomach acid 05/18/19  Yes Fulp, Cammie, MD  metoprolol succinate (TOPROL-XL) 25 MG 24 hr tablet Take 1 tablet (25 mg total) by mouth daily. 05/18/19  Yes Fulp, Cammie, MD  diclofenac sodium (VOLTAREN) 1 % GEL Apply 2 g topically 4 (four) times daily. To area of pain Patient not taking: Reported on 06/08/2019 05/18/19   Fulp, Hewitt Shortsammie, MD  pantoprazole (PROTONIX) 20 MG tablet Take 1 tablet (20 mg total) by mouth 2 (two) times daily before a meal. Patient not taking: Reported on 06/08/2019 09/15/18   Storm FriskWright, Patrick E, MD  sucralfate (CARAFATE) 1 g tablet Take 1 tablet (1 g total) by mouth 4 (four) times daily -  with meals and at bedtime. Patient not taking: Reported on 06/08/2019 05/18/19  Fulp, Cammie, MD  lisinopril (ZESTRIL) 5 MG tablet Take 1 tablet (5 mg total) by mouth daily. 05/18/19 06/07/19  Kimberly SaupeFulp, Cammie, MD    Inpatient Medications: Scheduled Meds:  heparin  4,000 Units Intravenous Once   Continuous Infusions:  sodium chloride 250 mL/hr at 06/08/19 1519   heparin     PRN Meds:   Allergies:    Allergies  Allergen Reactions   Carafate [Sucralfate] Swelling and Other (See Comments)    Patient stated it made her face swell (angioedema)- no breathing issues,  however   Lisinopril Swelling and Other (See Comments)    Patient stated it made her face swell (angioedema)- no breathing issues, however    Social History:   Social History   Socioeconomic History   Marital status: Married    Spouse name: Not on file   Number of children: Not on file   Years of education: Not on file   Highest education level: Not on file  Occupational History   Not on file  Social Needs   Financial resource strain: Not on file   Food insecurity    Worry: Not on file    Inability: Not on file   Transportation needs    Medical: Not on file    Non-medical: Not on file  Tobacco Use   Smoking status: Never Smoker   Smokeless tobacco: Never Used  Substance and Sexual Activity   Alcohol use: Never    Frequency: Never   Drug use: Never   Sexual activity: Not Currently  Lifestyle   Physical activity    Days per week: Not on file    Minutes per session: Not on file   Stress: Not on file  Relationships   Social connections    Talks on phone: Not on file    Gets together: Not on file    Attends religious service: Not on file    Active member of club or organization: Not on file    Attends meetings of clubs or organizations: Not on file    Relationship status: Not on file   Intimate partner violence    Fear of current or ex partner: Not on file    Emotionally abused: Not on file    Physically abused: Not on file    Forced sexual activity: Not on file  Other Topics Concern   Not on file  Social History Narrative   Not on file    Family History:    Family History  Problem Relation Age of Onset   Hypertension Mother      ROS:  Please see the history of present illness.  All other ROS reviewed and negative.     Physical Exam/Data:   Vitals:   06/08/19 1353 06/08/19 1530 06/08/19 1545 06/08/19 1700  BP: (!) 163/93 136/88 (!) 148/83   Pulse: (!) 114 83 75   Resp: 12 15 15    Temp: 97.8 F (36.6 C)     TempSrc: Oral       SpO2: 100% 99% 99%   Weight:    89.4 kg  Height:    5\' 4"  (1.626 m)   No intake or output data in the 24 hours ending 06/08/19 1836 Last 3 Weights 06/08/2019 06/07/2019 05/18/2019  Weight (lbs) 197 lb 1.5 oz 197 lb 195 lb  Weight (kg) 89.4 kg 89.359 kg 88.451 kg     Body mass index is 33.83 kg/m.  General:  Well nourished, well developed AAF, in no acute distress HEENT:  normal Neck: no JVD Endocrine:  No thryomegaly Vascular: No carotid bruits; FA pulses 2+ bilaterally without bruits  Cardiac:  normal S1, S2; RRR; no murmur  Lungs:  clear to auscultation bilaterally, no wheezing, rhonchi or rales  Abd: soft, nontender, no hepatomegaly  Ext: no edema Musculoskeletal:  No deformities, BUE and BLE strength normal and equal Skin: warm and dry  Neuro:  CNs 2-12 intact, no focal abnormalities noted Psych:  Normal affect   EKG:  The EKG was personally reviewed and demonstrates:  Sinus tachycardia, ST depression inferolateral leads, LVH with repolarization abnormalities Telemetry:  Telemetry was personally reviewed and demonstrates:  NSR heart rates 60-70s, occasional PVCs  Relevant CV Studies:  None  Laboratory Data:  High Sensitivity Troponin:   Recent Labs  Lab 06/08/19 1343 06/08/19 1630  TROPONINIHS 221* 893*     Cardiac EnzymesNo results for input(s): TROPONINI in the last 168 hours. No results for input(s): TROPIPOC in the last 168 hours.  Chemistry Recent Labs  Lab 06/07/19 1500 06/08/19 1343  NA 139 140  K 3.8 3.9  CL 106 106  CO2 26 21*  GLUCOSE 174* 243*  BUN 14 17  CREATININE 1.07* 1.15*  CALCIUM 9.2 9.4  GFRNONAA 56* 51*  GFRAA >60 59*  ANIONGAP 7 13    Recent Labs  Lab 06/07/19 1500 06/08/19 1343  PROT 7.0 7.7  ALBUMIN 3.7 4.0  AST 16 24  ALT 17 22  ALKPHOS 61 65  BILITOT 0.6 0.3   Hematology Recent Labs  Lab 06/07/19 1500 06/08/19 1343  WBC 7.7 16.7*  RBC 4.56 4.35  HGB 12.7 12.0  HCT 40.2 37.7  MCV 88.2 86.7  MCH 27.9 27.6  MCHC  31.6 31.8  RDW 12.8 12.8  PLT 284 267   BNPNo results for input(s): BNP, PROBNP in the last 168 hours.  DDimer No results for input(s): DDIMER in the last 168 hours.   Radiology/Studies:  US Abdomen Limited  Result Date: 06/08/2019 CLINICAL DATA:  Tenderness to palpation in the right upper quadrant EXAM: ULTRASOUND ABDOMEN LIMITED RIGHT UPPER QUADRANT COMPARISON:  None. FINDINGS: Gallbladder: There are 2 echogenic, shadowing stones located dependently within the gallbladder lumen, the largest measuring 1.2 x 0.7 x 1.1 cm. No gallbladder wall thickening. No sonographic Murphy sign noted by sonographer. Common bile duct: Diameter: 3.5 mm Liver: No focal lesion identified. Within normal limits in parenchymal echogenicity. Portal vein is patent on color Doppler imaging with normal direction of blood flow towards the liver. Other: None. IMPRESSION: Cholelithiasis without sonographic evidence of acute cholecystitis. Electronically Signed   By: Duanne Guess M.D.   On: 06/08/2019 16:03   Dg Chest Portable 1 View  Result Date: 06/08/2019 CLINICAL DATA:  Chest pain, shortness of breath EXAM: PORTABLE CHEST 1 VIEW COMPARISON:  11/10/2018 FINDINGS: The heart size and mediastinal contours are within normal limits. Both lungs are clear. The visualized skeletal structures are unremarkable. IMPRESSION: No acute cardiopulmonary findings. Electronically Signed   By: Duanne Guess M.D.   On: 06/08/2019 14:31   Ct Angio Chest/abd/pel For Dissection W And/or Wo Contrast  Result Date: 06/08/2019 CLINICAL DATA:  Questionable history of prior thoracic aortic dissection, chest pain radiating to upper back and right arm began last evening. EXAM: CT ANGIOGRAPHY CHEST, ABDOMEN AND PELVIS TECHNIQUE: Multidetector CT imaging through the chest, abdomen and pelvis was performed using the standard protocol during bolus administration of intravenous contrast. Multiplanar reconstructed images and MIPs were obtained and reviewed  to evaluate the vascular  anatomy. CONTRAST:  100mL OMNIPAQUE IOHEXOL 350 MG/ML SOLN COMPARISON:  Chest radiograph June 08, 2019 abdominal ultrasound June 08, 2019 FINDINGS: CTA CHEST FINDINGS Cardiovascular: Preferential opacification of the thoracic aorta. Evaluation of the aortic root is limited due to extensive cardiac pulsation artifact. No evidence of ascending or descending thoracic aortic aneurysm or dissection. No periaortic inflammation stranding. Atherosclerotic plaque within the normal caliber aorta. Normal heart size. No pericardial effusion. While the exam is not tailored for evaluation of pulmonary arteries no large central pulmonary embolism identified. Detection of smaller segmental and subsegmental pulmonary emboli is limited T2 both technique and cardiac pulsation artifact. No CT findings of right heart strain. Mediastinum/Nodes: No enlarged mediastinal or axillary lymph nodes. Thyroid gland, trachea, and esophagus demonstrate no significant findings. Lungs/Pleura: No consolidation, features of edema, pneumothorax, or effusion. Basilar in dependent areas of atelectasis. Musculoskeletal: No chest wall mass or suspicious bone lesions identified. Review of the MIP images confirms the above findings. CTA ABDOMEN AND PELVIS FINDINGS VASCULAR Aorta: Normal caliber aorta without aneurysm, dissection, vasculitis or significant stenosis. Celiac: Patent without evidence of aneurysm, dissection, vasculitis or significant stenosis. SMA: Patent without evidence of aneurysm, dissection, vasculitis or significant stenosis. Renals: Both renal arteries are patent without evidence of aneurysm, dissection, vasculitis, fibromuscular dysplasia or significant stenosis. IMA: Atherosclerotic plaque at the ostium with minimal narrowing. Vessel appears patent without evidence of aneurysm, dissection, or vasculitis. Inflow: Crescentic thickening of the wall of the left common iliac artery may reflect eccentric soft plaque  versus a small intramural hematoma with several rounded, narrow necked pools of enhancement likely reflecting intramural blood pool. No critical stenosis is identified. The visualized portions of the left internal and external iliac as well as the right iliac system demonstrate atheromatous disease of the native vessels without features of critical stenosis, aneurysm or vasculitis. Veins: No obvious venous abnormality within the limitations of this arterial phase study. Review of the MIP images confirms the above findings. NON-VASCULAR Hepatobiliary: No focal liver abnormality is seen. No gallstones, gallbladder wall thickening, or biliary dilatation. Pancreas: Unremarkable. No pancreatic ductal dilatation or surrounding inflammatory changes. Spleen: Normal in size without focal abnormality. Adrenals/Urinary Tract: Adrenal glands are unremarkable. Kidneys are normal, without renal calculi, focal lesion, or hydronephrosis. Bladder is unremarkable. Stomach/Bowel: Distal esophagus, stomach and duodenal sweep are unremarkable. No bowel wall thickening or dilatation. No evidence of obstruction. A normal appendix is visualized. Lymphatic: No suspicious or enlarged lymph nodes in the included lymphatic chains. Reproductive: Uterus is deviated slightly rightward but is otherwise unremarkable in appearance. No concerning adnexal lesions. Other: Ventral abdominal diastasis is noted without evidence of mechanical bowel obstruction. No free fluid or free gas within the abdomen or pelvis. Musculoskeletal: Multilevel degenerative changes in the spine are most pronounced at L5-S1. No acute or concerning osseous features. Review of the MIP images confirms the above findings. IMPRESSION: 1. No evidence of acute aortic syndrome. No evidence of aortic dissection. 2. Crescentic thickening of the wall of the left common iliac artery may reflect eccentric soft plaque versus a small intramural hematoma with several foci of intramural  blood pool. No critical stenosis is identified. 3. No acute intrathoracic or intra-abdominal process. 4.  Aortic Atherosclerosis (ICD10-I70.0). These results were called by telephone at the time of interpretation on 06/08/2019 at 6:03 pm to Dr. Freida BusmanAllen, who verbally acknowledged these results. Electronically Signed   By: Kreg ShropshirePrice  DeHay M.D.   On: 06/08/2019 18:05    Assessment and Plan:   1. Atypical chest Pain Patient  with no previous cardiac history or signifigant cardiac procedures presents to the ED for chest pain. States pain is right sided and radiates to upper back and right arm. Worse with eating and walking. Associated nausea and general fatigue. Denies SOB. Also when she eats something she throws up. Patient was in ER yesterday for angioedema from Lisinopril and has not taken it since.  -HS troponin yesterday 221>>893 today -K+ 3.9, WBC 7.7 > 16.7, Hgb 12.0 -Creatinine 1.15, daily BMET -EKG Sinus tachycardia, 104 bpm, inferolateral ST depression -CT angio chest/abd negative for aortic dissection or process -on IV heparin -Currently chest pain free. Rates in the 70s -Continue Toprol 25mg  daily, Lipitor 40mg  daily, and ASA 81mg  -Trend Troponin -Admit to telemetry -Order Echo  2. Elevated Troponin -221 yesterday with patient came in with angioedema. 893 today -B/P was high on admission, 212/112 that decreased to 146/102 after nitro -Possibly elevation due to demand ischemia from previous events  3. HTN -Amlodipine 5mg  daily, Toprol 25mg  -Pressure slightly elevated -Continue to monitor  4. Hyperlipidemia -Continue statin -LDL 109 12/10/2018 -Goal <70  5. Nausea/Vomiting -occurs after eating -per IM -Zofran seemed to improve symptoms  For questions or updates, please contact [place] Please consult [email or web address] for contact info under     Signed, Jemina Scahill Ninfa Meeker, PA-C  06/08/2019 6:36 PM

## 2019-06-08 NOTE — ED Notes (Signed)
Patient transported to CT 

## 2019-06-08 NOTE — H&P (View-Only) (Signed)
Cardiology Consultation:   Patient ID: Kimberly Bryant MRN: [id number]; DOB: 03/07/1956  Admit date: 06/08/2019 Date of Consult: 06/08/2019  Primary Care Provider: Cain SaupeFulp, Cammie, MD Primary Cardiologist: No primary care provider on file.  Primary Electrophysiologist:  None    Patient Profile:   Kimberly Bryant is a 63 y.o. female with a hx of  hypertension, hyperlipidema, and GERD who is being seen today for the evaluation of chest pain at the request of Dr. Freida BusmanAllen.  History of Present Illness:   Patient needs interpreter. Patient has no previous cardiac history or significant cardiac procedures.  Patient was seen 8/2 20 in the ED for angioedema due to Lisinopril. She had facial swelling with no chest pain or sob. Reported RUQ pain as well. HS troponin 221. No EKG. Labs were otherwise reassuring.  She was given Solumedrol, pepcid and benadryl and sent home.   Ms. Kimberly Bryant returned to the ER 8/3 for R sided pain/epigastric pain that radiates to R shoulder. Patient says pain is not new. Says it has been worsening over the last year. She said she was going to see a specialist later this month but ended up in the ED before the appointment. Pain is worse with eating and walking. Pain is intermittent and nothing specific seems to precipitate it. Nothing seems to make the pain better. Patient has not taken Tylenol or ibuprofen for pain. Denies associated SOB with pain.   In the ER patient was given 2sl Nitro with some relief. She also received 324 ASA and 4 Zofran. B/P 212/112 that decreased to 146/102 after nitro. HS 112, RR20, temp 99.3, 99% RA. HS troponin 221>893. K+ 3.9, Glucose 243, Creatinine 1.15, WBC 7.7 > 16.7, hgb 12.0. EKG showed Sinus tachycardia 104, ST depression inferolateral leads.CT angio chest/abd with no evidence of acute aortic syndrome or dissection, crescentic thickening of the wall of the left common iliac artery; no critical stenosis. No acute intrathoracic or intra-abdomibnal process,  positive for aortic atherosclerosis.  Patient denies alcohol/tobacco/drug use. No significant family history. She works full-time.   Heart Pathway Score:     Past Medical History:  Diagnosis Date   CAD (coronary artery disease)    Hypertension     No past surgical history on file.   Home Medications:  Prior to Admission medications   Medication Sig Start Date End Date Taking? Authorizing Provider  amLODipine (NORVASC) 10 MG tablet Take 1 tablet (10 mg total) by mouth daily. 05/18/19  Yes Fulp, Cammie, MD  atorvastatin (LIPITOR) 40 MG tablet Take 1 tablet (40 mg total) by mouth daily. 05/18/19  Yes Fulp, Cammie, MD  lansoprazole (PREVACID) 30 MG capsule Take 1 capsule (30 mg total) by mouth 2 (two) times daily. To reduce stomach acid 05/18/19  Yes Fulp, Cammie, MD  metoprolol succinate (TOPROL-XL) 25 MG 24 hr tablet Take 1 tablet (25 mg total) by mouth daily. 05/18/19  Yes Fulp, Cammie, MD  diclofenac sodium (VOLTAREN) 1 % GEL Apply 2 g topically 4 (four) times daily. To area of pain Patient not taking: Reported on 06/08/2019 05/18/19   Fulp, Hewitt Shortsammie, MD  pantoprazole (PROTONIX) 20 MG tablet Take 1 tablet (20 mg total) by mouth 2 (two) times daily before a meal. Patient not taking: Reported on 06/08/2019 09/15/18   Storm FriskWright, Patrick E, MD  sucralfate (CARAFATE) 1 g tablet Take 1 tablet (1 g total) by mouth 4 (four) times daily -  with meals and at bedtime. Patient not taking: Reported on 06/08/2019 05/18/19  Fulp, Cammie, MD  lisinopril (ZESTRIL) 5 MG tablet Take 1 tablet (5 mg total) by mouth daily. 05/18/19 06/07/19  Cain SaupeFulp, Cammie, MD    Inpatient Medications: Scheduled Meds:  heparin  4,000 Units Intravenous Once   Continuous Infusions:  sodium chloride 250 mL/hr at 06/08/19 1519   heparin     PRN Meds:   Allergies:    Allergies  Allergen Reactions   Carafate [Sucralfate] Swelling and Other (See Comments)    Patient stated it made her face swell (angioedema)- no breathing issues,  however   Lisinopril Swelling and Other (See Comments)    Patient stated it made her face swell (angioedema)- no breathing issues, however    Social History:   Social History   Socioeconomic History   Marital status: Married    Spouse name: Not on file   Number of children: Not on file   Years of education: Not on file   Highest education level: Not on file  Occupational History   Not on file  Social Needs   Financial resource strain: Not on file   Food insecurity    Worry: Not on file    Inability: Not on file   Transportation needs    Medical: Not on file    Non-medical: Not on file  Tobacco Use   Smoking status: Never Smoker   Smokeless tobacco: Never Used  Substance and Sexual Activity   Alcohol use: Never    Frequency: Never   Drug use: Never   Sexual activity: Not Currently  Lifestyle   Physical activity    Days per week: Not on file    Minutes per session: Not on file   Stress: Not on file  Relationships   Social connections    Talks on phone: Not on file    Gets together: Not on file    Attends religious service: Not on file    Active member of club or organization: Not on file    Attends meetings of clubs or organizations: Not on file    Relationship status: Not on file   Intimate partner violence    Fear of current or ex partner: Not on file    Emotionally abused: Not on file    Physically abused: Not on file    Forced sexual activity: Not on file  Other Topics Concern   Not on file  Social History Narrative   Not on file    Family History:    Family History  Problem Relation Age of Onset   Hypertension Mother      ROS:  Please see the history of present illness.  All other ROS reviewed and negative.     Physical Exam/Data:   Vitals:   06/08/19 1353 06/08/19 1530 06/08/19 1545 06/08/19 1700  BP: (!) 163/93 136/88 (!) 148/83   Pulse: (!) 114 83 75   Resp: 12 15 15    Temp: 97.8 F (36.6 C)     TempSrc: Oral       SpO2: 100% 99% 99%   Weight:    89.4 kg  Height:    5\' 4"  (1.626 m)   No intake or output data in the 24 hours ending 06/08/19 1836 Last 3 Weights 06/08/2019 06/07/2019 05/18/2019  Weight (lbs) 197 lb 1.5 oz 197 lb 195 lb  Weight (kg) 89.4 kg 89.359 kg 88.451 kg     Body mass index is 33.83 kg/m.  General:  Well nourished, well developed AAF, in no acute distress HEENT:  normal Neck: no JVD Endocrine:  No thryomegaly Vascular: No carotid bruits; FA pulses 2+ bilaterally without bruits  Cardiac:  normal S1, S2; RRR; no murmur  Lungs:  clear to auscultation bilaterally, no wheezing, rhonchi or rales  Abd: soft, nontender, no hepatomegaly  Ext: no edema Musculoskeletal:  No deformities, BUE and BLE strength normal and equal Skin: warm and dry  Neuro:  CNs 2-12 intact, no focal abnormalities noted Psych:  Normal affect   EKG:  The EKG was personally reviewed and demonstrates:  Sinus tachycardia, ST depression inferolateral leads, LVH with repolarization abnormalities Telemetry:  Telemetry was personally reviewed and demonstrates:  NSR heart rates 60-70s, occasional PVCs  Relevant CV Studies:  None  Laboratory Data:  High Sensitivity Troponin:   Recent Labs  Lab 06/08/19 1343 06/08/19 1630  TROPONINIHS 221* 893*     Cardiac EnzymesNo results for input(s): TROPONINI in the last 168 hours. No results for input(s): TROPIPOC in the last 168 hours.  Chemistry Recent Labs  Lab 06/07/19 1500 06/08/19 1343  NA 139 140  K 3.8 3.9  CL 106 106  CO2 26 21*  GLUCOSE 174* 243*  BUN 14 17  CREATININE 1.07* 1.15*  CALCIUM 9.2 9.4  GFRNONAA 56* 51*  GFRAA >60 59*  ANIONGAP 7 13    Recent Labs  Lab 06/07/19 1500 06/08/19 1343  PROT 7.0 7.7  ALBUMIN 3.7 4.0  AST 16 24  ALT 17 22  ALKPHOS 61 65  BILITOT 0.6 0.3   Hematology Recent Labs  Lab 06/07/19 1500 06/08/19 1343  WBC 7.7 16.7*  RBC 4.56 4.35  HGB 12.7 12.0  HCT 40.2 37.7  MCV 88.2 86.7  MCH 27.9 27.6  MCHC  31.6 31.8  RDW 12.8 12.8  PLT 284 267   BNPNo results for input(s): BNP, PROBNP in the last 168 hours.  DDimer No results for input(s): DDIMER in the last 168 hours.   Radiology/Studies:  US Abdomen Limited  Result Date: 06/08/2019 CLINICAL DATA:  Tenderness to palpation in the right upper quadrant EXAM: ULTRASOUND ABDOMEN LIMITED RIGHT UPPER QUADRANT COMPARISON:  None. FINDINGS: Gallbladder: There are 2 echogenic, shadowing stones located dependently within the gallbladder lumen, the largest measuring 1.2 x 0.7 x 1.1 cm. No gallbladder wall thickening. No sonographic Murphy sign noted by sonographer. Common bile duct: Diameter: 3.5 mm Liver: No focal lesion identified. Within normal limits in parenchymal echogenicity. Portal vein is patent on color Doppler imaging with normal direction of blood flow towards the liver. Other: None. IMPRESSION: Cholelithiasis without sonographic evidence of acute cholecystitis. Electronically Signed   By: Duanne Guess M.D.   On: 06/08/2019 16:03   Dg Chest Portable 1 View  Result Date: 06/08/2019 CLINICAL DATA:  Chest pain, shortness of breath EXAM: PORTABLE CHEST 1 VIEW COMPARISON:  11/10/2018 FINDINGS: The heart size and mediastinal contours are within normal limits. Both lungs are clear. The visualized skeletal structures are unremarkable. IMPRESSION: No acute cardiopulmonary findings. Electronically Signed   By: Duanne Guess M.D.   On: 06/08/2019 14:31   Ct Angio Chest/abd/pel For Dissection W And/or Wo Contrast  Result Date: 06/08/2019 CLINICAL DATA:  Questionable history of prior thoracic aortic dissection, chest pain radiating to upper back and right arm began last evening. EXAM: CT ANGIOGRAPHY CHEST, ABDOMEN AND PELVIS TECHNIQUE: Multidetector CT imaging through the chest, abdomen and pelvis was performed using the standard protocol during bolus administration of intravenous contrast. Multiplanar reconstructed images and MIPs were obtained and reviewed  to evaluate the vascular  anatomy. CONTRAST:  100mL OMNIPAQUE IOHEXOL 350 MG/ML SOLN COMPARISON:  Chest radiograph June 08, 2019 abdominal ultrasound June 08, 2019 FINDINGS: CTA CHEST FINDINGS Cardiovascular: Preferential opacification of the thoracic aorta. Evaluation of the aortic root is limited due to extensive cardiac pulsation artifact. No evidence of ascending or descending thoracic aortic aneurysm or dissection. No periaortic inflammation stranding. Atherosclerotic plaque within the normal caliber aorta. Normal heart size. No pericardial effusion. While the exam is not tailored for evaluation of pulmonary arteries no large central pulmonary embolism identified. Detection of smaller segmental and subsegmental pulmonary emboli is limited T2 both technique and cardiac pulsation artifact. No CT findings of right heart strain. Mediastinum/Nodes: No enlarged mediastinal or axillary lymph nodes. Thyroid gland, trachea, and esophagus demonstrate no significant findings. Lungs/Pleura: No consolidation, features of edema, pneumothorax, or effusion. Basilar in dependent areas of atelectasis. Musculoskeletal: No chest wall mass or suspicious bone lesions identified. Review of the MIP images confirms the above findings. CTA ABDOMEN AND PELVIS FINDINGS VASCULAR Aorta: Normal caliber aorta without aneurysm, dissection, vasculitis or significant stenosis. Celiac: Patent without evidence of aneurysm, dissection, vasculitis or significant stenosis. SMA: Patent without evidence of aneurysm, dissection, vasculitis or significant stenosis. Renals: Both renal arteries are patent without evidence of aneurysm, dissection, vasculitis, fibromuscular dysplasia or significant stenosis. IMA: Atherosclerotic plaque at the ostium with minimal narrowing. Vessel appears patent without evidence of aneurysm, dissection, or vasculitis. Inflow: Crescentic thickening of the wall of the left common iliac artery may reflect eccentric soft plaque  versus a small intramural hematoma with several rounded, narrow necked pools of enhancement likely reflecting intramural blood pool. No critical stenosis is identified. The visualized portions of the left internal and external iliac as well as the right iliac system demonstrate atheromatous disease of the native vessels without features of critical stenosis, aneurysm or vasculitis. Veins: No obvious venous abnormality within the limitations of this arterial phase study. Review of the MIP images confirms the above findings. NON-VASCULAR Hepatobiliary: No focal liver abnormality is seen. No gallstones, gallbladder wall thickening, or biliary dilatation. Pancreas: Unremarkable. No pancreatic ductal dilatation or surrounding inflammatory changes. Spleen: Normal in size without focal abnormality. Adrenals/Urinary Tract: Adrenal glands are unremarkable. Kidneys are normal, without renal calculi, focal lesion, or hydronephrosis. Bladder is unremarkable. Stomach/Bowel: Distal esophagus, stomach and duodenal sweep are unremarkable. No bowel wall thickening or dilatation. No evidence of obstruction. A normal appendix is visualized. Lymphatic: No suspicious or enlarged lymph nodes in the included lymphatic chains. Reproductive: Uterus is deviated slightly rightward but is otherwise unremarkable in appearance. No concerning adnexal lesions. Other: Ventral abdominal diastasis is noted without evidence of mechanical bowel obstruction. No free fluid or free gas within the abdomen or pelvis. Musculoskeletal: Multilevel degenerative changes in the spine are most pronounced at L5-S1. No acute or concerning osseous features. Review of the MIP images confirms the above findings. IMPRESSION: 1. No evidence of acute aortic syndrome. No evidence of aortic dissection. 2. Crescentic thickening of the wall of the left common iliac artery may reflect eccentric soft plaque versus a small intramural hematoma with several foci of intramural  blood pool. No critical stenosis is identified. 3. No acute intrathoracic or intra-abdominal process. 4.  Aortic Atherosclerosis (ICD10-I70.0). These results were called by telephone at the time of interpretation on 06/08/2019 at 6:03 pm to Dr. Freida BusmanAllen, who verbally acknowledged these results. Electronically Signed   By: Kreg ShropshirePrice  DeHay M.D.   On: 06/08/2019 18:05    Assessment and Plan:   1. Atypical chest Pain Patient  with no previous cardiac history or signifigant cardiac procedures presents to the ED for chest pain. States pain is right sided and radiates to upper back and right arm. Worse with eating and walking. Associated nausea and general fatigue. Denies SOB. Also when she eats something she throws up. Patient was in ER yesterday for angioedema from Lisinopril and has not taken it since.  -HS troponin yesterday 221>>893 today -K+ 3.9, WBC 7.7 > 16.7, Hgb 12.0 -Creatinine 1.15, daily BMET -EKG Sinus tachycardia, 104 bpm, inferolateral ST depression -CT angio chest/abd negative for aortic dissection or process -on IV heparin -Currently chest pain free. Rates in the 70s -Continue Toprol 25mg  daily, Lipitor 40mg  daily, and ASA 81mg  -Trend Troponin -Admit to telemetry -Order Echo  2. Elevated Troponin -221 yesterday with patient came in with angioedema. 893 today -B/P was high on admission, 212/112 that decreased to 146/102 after nitro -Possibly elevation due to demand ischemia from previous events  3. HTN -Amlodipine 5mg  daily, Toprol 25mg  -Pressure slightly elevated -Continue to monitor  4. Hyperlipidemia -Continue statin -LDL 109 12/10/2018 -Goal <70  5. Nausea/Vomiting -occurs after eating -per IM -Zofran seemed to improve symptoms  For questions or updates, please contact [place] Please consult [email or web address] for contact info under     Signed, Eitan Doubleday Ninfa Meeker, PA-C  06/08/2019 6:36 PM

## 2019-06-09 ENCOUNTER — Inpatient Hospital Stay (HOSPITAL_COMMUNITY): Payer: 59

## 2019-06-09 ENCOUNTER — Inpatient Hospital Stay (HOSPITAL_COMMUNITY)
Admission: EM | Disposition: A | Discharge status: Home Health | Payer: Self-pay | Source: Home / Self Care | Attending: Cardiothoracic Surgery

## 2019-06-09 ENCOUNTER — Other Ambulatory Visit: Payer: Self-pay

## 2019-06-09 ENCOUNTER — Telehealth: Payer: Self-pay | Admitting: Family Medicine

## 2019-06-09 ENCOUNTER — Encounter (HOSPITAL_COMMUNITY): Payer: Self-pay | Admitting: Anesthesiology

## 2019-06-09 DIAGNOSIS — R778 Other specified abnormalities of plasma proteins: Secondary | ICD-10-CM

## 2019-06-09 DIAGNOSIS — I2510 Atherosclerotic heart disease of native coronary artery without angina pectoris: Secondary | ICD-10-CM

## 2019-06-09 DIAGNOSIS — I25110 Atherosclerotic heart disease of native coronary artery with unstable angina pectoris: Secondary | ICD-10-CM

## 2019-06-09 DIAGNOSIS — I340 Nonrheumatic mitral (valve) insufficiency: Secondary | ICD-10-CM | POA: Diagnosis not present

## 2019-06-09 DIAGNOSIS — I160 Hypertensive urgency: Secondary | ICD-10-CM

## 2019-06-09 DIAGNOSIS — Z01810 Encounter for preprocedural cardiovascular examination: Secondary | ICD-10-CM | POA: Diagnosis not present

## 2019-06-09 DIAGNOSIS — I214 Non-ST elevation (NSTEMI) myocardial infarction: Principal | ICD-10-CM

## 2019-06-09 DIAGNOSIS — R0789 Other chest pain: Secondary | ICD-10-CM

## 2019-06-09 DIAGNOSIS — I10 Essential (primary) hypertension: Secondary | ICD-10-CM

## 2019-06-09 HISTORY — PX: LEFT HEART CATH AND CORONARY ANGIOGRAPHY: CATH118249

## 2019-06-09 LAB — HEPARIN LEVEL (UNFRACTIONATED)
Heparin Unfractionated: 0.5 [IU]/mL (ref 0.30–0.70)
Heparin Unfractionated: 0.34 IU/mL (ref 0.30–0.70)

## 2019-06-09 LAB — TYPE AND SCREEN
ABO/RH(D): O POS
Antibody Screen: NEGATIVE

## 2019-06-09 LAB — BASIC METABOLIC PANEL
Anion gap: 11 (ref 5–15)
BUN: 11 mg/dL (ref 8–23)
CO2: 24 mmol/L (ref 22–32)
Calcium: 8.9 mg/dL (ref 8.9–10.3)
Chloride: 108 mmol/L (ref 98–111)
Creatinine, Ser: 0.87 mg/dL (ref 0.44–1.00)
GFR calc Af Amer: >60 mL/min (ref >60)
GFR calc non Af Amer: >60 mL/min (ref >60)
Glucose, Bld: 106 mg/dL — ABNORMAL HIGH (ref 70–99)
Potassium: 3.7 mmol/L (ref 3.5–5.1)
Sodium: 143 mmol/L (ref 135–145)

## 2019-06-09 LAB — GLUCOSE, CAPILLARY
Glucose-Capillary: 168 mg/dL — ABNORMAL HIGH (ref 70–99)
Glucose-Capillary: 71 mg/dL (ref 70–99)
Glucose-Capillary: 73 mg/dL (ref 70–99)
Glucose-Capillary: 76 mg/dL (ref 70–99)
Glucose-Capillary: 87 mg/dL (ref 70–99)
Glucose-Capillary: 98 mg/dL (ref 70–99)

## 2019-06-09 LAB — CBC
HCT: 34.7 % — ABNORMAL LOW (ref 36.0–46.0)
Hemoglobin: 11.1 g/dL — ABNORMAL LOW (ref 12.0–15.0)
MCH: 27.5 pg (ref 26.0–34.0)
MCHC: 32 g/dL (ref 30.0–36.0)
MCV: 85.9 fL (ref 80.0–100.0)
Platelets: 263 10*3/uL (ref 150–400)
RBC: 4.04 MIL/uL (ref 3.87–5.11)
RDW: 12.9 % (ref 11.5–15.5)
WBC: 15.6 10*3/uL — ABNORMAL HIGH (ref 4.0–10.5)
nRBC: 0 % (ref 0.0–0.2)

## 2019-06-09 LAB — ABO/RH: ABO/RH(D): O POS

## 2019-06-09 LAB — ECHOCARDIOGRAM COMPLETE
Height: 60 in
Weight: 3097.6 oz

## 2019-06-09 LAB — HEMOGLOBIN A1C
Hgb A1c MFr Bld: 5.9 % — ABNORMAL HIGH (ref 4.8–5.6)
Mean Plasma Glucose: 122.63 mg/dL

## 2019-06-09 LAB — HIV ANTIBODY (ROUTINE TESTING W REFLEX): HIV Screen 4th Generation wRfx: NONREACTIVE

## 2019-06-09 SURGERY — LEFT HEART CATH AND CORONARY ANGIOGRAPHY
Anesthesia: LOCAL

## 2019-06-09 MED ORDER — HEPARIN SODIUM (PORCINE) 5000 UNIT/ML IJ SOLN
5000.0000 [IU] | Freq: Three times a day (TID) | INTRAMUSCULAR | Status: DC
  Administered 2019-06-10: 5000 [IU] via SUBCUTANEOUS
  Filled 2019-06-09: qty 1

## 2019-06-09 MED ORDER — HEPARIN (PORCINE) IN NACL 1000-0.9 UT/500ML-% IV SOLN
INTRAVENOUS | Status: DC | PRN
  Administered 2019-06-09 (×2): 500 mL

## 2019-06-09 MED ORDER — VERAPAMIL HCL 2.5 MG/ML IV SOLN
INTRAVENOUS | Status: AC
  Filled 2019-06-09: qty 2

## 2019-06-09 MED ORDER — VANCOMYCIN HCL 10 G IV SOLR
1500.0000 mg | INTRAVENOUS | Status: AC
  Administered 2019-06-10: 1500 mg via INTRAVENOUS
  Filled 2019-06-09: qty 1500

## 2019-06-09 MED ORDER — VERAPAMIL HCL 2.5 MG/ML IV SOLN
INTRAVENOUS | Status: DC | PRN
  Administered 2019-06-09: 14:00:00 10 mL via INTRA_ARTERIAL

## 2019-06-09 MED ORDER — BISACODYL 5 MG PO TBEC: 5.0000 mg | DELAYED_RELEASE_TABLET | Freq: Once | ORAL | Status: DC

## 2019-06-09 MED ORDER — DEXMEDETOMIDINE HCL IN NACL 400 MCG/100ML IV SOLN
0.1000 ug/kg/h | INTRAVENOUS | Status: AC
  Administered 2019-06-10: 0.7 ug/kg/h via INTRAVENOUS
  Filled 2019-06-09: qty 100

## 2019-06-09 MED ORDER — INSULIN REGULAR(HUMAN) IN NACL 100-0.9 UT/100ML-% IV SOLN
INTRAVENOUS | Status: AC
  Administered 2019-06-10: .9 [IU]/h via INTRAVENOUS
  Filled 2019-06-09: qty 100

## 2019-06-09 MED ORDER — SODIUM CHLORIDE 0.9 % WEIGHT BASED INFUSION
3.0000 mL/kg/h | INTRAVENOUS | Status: AC
  Administered 2019-06-09: 3 mL/kg/h via INTRAVENOUS

## 2019-06-09 MED ORDER — LIDOCAINE HCL (PF) 1 % IJ SOLN
INTRAMUSCULAR | Status: AC
  Filled 2019-06-09: qty 30

## 2019-06-09 MED ORDER — CHLORHEXIDINE GLUCONATE 0.12 % MT SOLN
15.0000 mL | Freq: Once | OROMUCOSAL | Status: AC
  Administered 2019-06-10: 15 mL via OROMUCOSAL
  Filled 2019-06-09: qty 15

## 2019-06-09 MED ORDER — METOPROLOL TARTRATE 12.5 MG HALF TABLET: 12.5000 mg | ORAL_TABLET | Freq: Once | ORAL | Status: DC

## 2019-06-09 MED ORDER — ACETAMINOPHEN 325 MG PO TABS: 650.0000 mg | ORAL_TABLET | ORAL | Status: DC | PRN

## 2019-06-09 MED ORDER — FENTANYL CITRATE (PF) 100 MCG/2ML IJ SOLN
INTRAMUSCULAR | Status: DC | PRN
  Administered 2019-06-09: 25 ug via INTRAVENOUS

## 2019-06-09 MED ORDER — ISOSORBIDE MONONITRATE ER 30 MG PO TB24
30.0000 mg | ORAL_TABLET | Freq: Every day | ORAL | Status: DC
  Administered 2019-06-09: 30 mg via ORAL
  Filled 2019-06-09: qty 1

## 2019-06-09 MED ORDER — MAGNESIUM SULFATE 50 % IJ SOLN
40.0000 meq | INTRAMUSCULAR | Status: DC
  Filled 2019-06-09: qty 9.85

## 2019-06-09 MED ORDER — ATORVASTATIN CALCIUM 80 MG PO TABS: 80.0000 mg | ORAL_TABLET | Freq: Every day | ORAL | Status: DC

## 2019-06-09 MED ORDER — CHLORHEXIDINE GLUCONATE 4 % EX LIQD
60.0000 mL | Freq: Once | CUTANEOUS | Status: AC
  Administered 2019-06-09: 4 via TOPICAL
  Filled 2019-06-09: qty 60

## 2019-06-09 MED ORDER — NITROGLYCERIN IN D5W 200-5 MCG/ML-% IV SOLN
2.0000 ug/min | INTRAVENOUS | Status: AC
  Administered 2019-06-10: 3 ug/min via INTRAVENOUS
  Filled 2019-06-09: qty 250

## 2019-06-09 MED ORDER — TRANEXAMIC ACID (OHS) PUMP PRIME SOLUTION
2.0000 mg/kg | INTRAVENOUS | Status: DC
  Filled 2019-06-09: qty 1.76

## 2019-06-09 MED ORDER — SODIUM CHLORIDE 0.9% FLUSH: 3.0000 mL | INTRAVENOUS | Status: DC | PRN

## 2019-06-09 MED ORDER — FENTANYL CITRATE (PF) 100 MCG/2ML IJ SOLN
INTRAMUSCULAR | Status: AC
  Filled 2019-06-09: qty 2

## 2019-06-09 MED ORDER — MILRINONE LACTATE IN DEXTROSE 20-5 MG/100ML-% IV SOLN
0.3000 ug/kg/min | INTRAVENOUS | Status: DC
  Filled 2019-06-09: qty 100

## 2019-06-09 MED ORDER — SODIUM CHLORIDE 0.9 % IV SOLN
INTRAVENOUS | Status: DC
  Filled 2019-06-09: qty 30

## 2019-06-09 MED ORDER — SODIUM CHLORIDE 0.9 % IV SOLN: 250.0000 mL | INTRAVENOUS | Status: DC | PRN

## 2019-06-09 MED ORDER — LIDOCAINE HCL (PF) 1 % IJ SOLN
INTRAMUSCULAR | Status: DC | PRN
  Administered 2019-06-09: 2 mL

## 2019-06-09 MED ORDER — INSULIN ASPART 100 UNIT/ML ~~LOC~~ SOLN: 0.0000 [IU] | Freq: Three times a day (TID) | SUBCUTANEOUS | Status: DC

## 2019-06-09 MED ORDER — SODIUM CHLORIDE 0.9 % IV SOLN
1.5000 g | INTRAVENOUS | Status: AC
  Administered 2019-06-10: .75 g via INTRAVENOUS
  Administered 2019-06-10: 1.5 g via INTRAVENOUS
  Filled 2019-06-09: qty 1.5

## 2019-06-09 MED ORDER — HEPARIN SODIUM (PORCINE) 1000 UNIT/ML IJ SOLN
INTRAMUSCULAR | Status: DC | PRN
  Administered 2019-06-09: 4500 [IU] via INTRAVENOUS

## 2019-06-09 MED ORDER — DOPAMINE-DEXTROSE 3.2-5 MG/ML-% IV SOLN
0.0000 ug/kg/min | INTRAVENOUS | Status: DC
  Filled 2019-06-09: qty 250

## 2019-06-09 MED ORDER — TEMAZEPAM 15 MG PO CAPS: 15.0000 mg | ORAL_CAPSULE | Freq: Once | ORAL | Status: DC | PRN

## 2019-06-09 MED ORDER — DIAZEPAM 5 MG PO TABS: 5.0000 mg | ORAL_TABLET | Freq: Four times a day (QID) | ORAL | Status: DC | PRN

## 2019-06-09 MED ORDER — MIDAZOLAM HCL 2 MG/2ML IJ SOLN
INTRAMUSCULAR | Status: DC | PRN
  Administered 2019-06-09: 2 mg via INTRAVENOUS

## 2019-06-09 MED ORDER — HYDRALAZINE HCL 20 MG/ML IJ SOLN: 10.0000 mg | INTRAMUSCULAR | Status: AC | PRN

## 2019-06-09 MED ORDER — TRANEXAMIC ACID 1000 MG/10ML IV SOLN
1.5000 mg/kg/h | INTRAVENOUS | Status: AC
  Administered 2019-06-10: 1.5 mg/kg/h via INTRAVENOUS
  Filled 2019-06-09: qty 25

## 2019-06-09 MED ORDER — VANCOMYCIN HCL 1000 MG IV SOLR
INTRAVENOUS | Status: DC
  Filled 2019-06-09: qty 1000

## 2019-06-09 MED ORDER — HEPARIN (PORCINE) IN NACL 1000-0.9 UT/500ML-% IV SOLN
INTRAVENOUS | Status: AC
  Filled 2019-06-09: qty 1000

## 2019-06-09 MED ORDER — ATORVASTATIN CALCIUM 80 MG PO TABS
80.0000 mg | ORAL_TABLET | Freq: Every day | ORAL | Status: DC
  Administered 2019-06-09 – 2019-06-15 (×7): 80 mg via ORAL
  Filled 2019-06-09 (×7): qty 1

## 2019-06-09 MED ORDER — TRANEXAMIC ACID (OHS) BOLUS VIA INFUSION
15.0000 mg/kg | INTRAVENOUS | Status: AC
  Administered 2019-06-10: 1317 mg via INTRAVENOUS
  Filled 2019-06-09: qty 1317

## 2019-06-09 MED ORDER — ASPIRIN 81 MG PO CHEW: 81.0000 mg | CHEWABLE_TABLET | ORAL | Status: AC

## 2019-06-09 MED ORDER — MIDAZOLAM HCL 2 MG/2ML IJ SOLN
INTRAMUSCULAR | Status: AC
  Filled 2019-06-09: qty 2

## 2019-06-09 MED ORDER — LABETALOL HCL 5 MG/ML IV SOLN: 10.0000 mg | INTRAVENOUS | Status: AC | PRN

## 2019-06-09 MED ORDER — ASPIRIN 81 MG PO CHEW: 81.0000 mg | CHEWABLE_TABLET | Freq: Every day | ORAL | Status: DC

## 2019-06-09 MED ORDER — POTASSIUM CHLORIDE 2 MEQ/ML IV SOLN
80.0000 meq | INTRAVENOUS | Status: DC
  Filled 2019-06-09: qty 40

## 2019-06-09 MED ORDER — ONDANSETRON HCL 4 MG/2ML IJ SOLN: 4.0000 mg | Freq: Four times a day (QID) | INTRAMUSCULAR | Status: DC | PRN

## 2019-06-09 MED ORDER — PLASMA-LYTE 148 IV SOLN
INTRAVENOUS | Status: DC
  Filled 2019-06-09: qty 2.5

## 2019-06-09 MED ORDER — PHENYLEPHRINE HCL-NACL 20-0.9 MG/250ML-% IV SOLN
30.0000 ug/min | INTRAVENOUS | Status: DC
  Filled 2019-06-09: qty 250

## 2019-06-09 MED ORDER — CHLORHEXIDINE GLUCONATE 4 % EX LIQD
60.0000 mL | Freq: Once | CUTANEOUS | Status: AC
  Administered 2019-06-10: 4 via TOPICAL
  Filled 2019-06-09: qty 60

## 2019-06-09 MED ORDER — SODIUM CHLORIDE 0.9 % IV SOLN
INTRAVENOUS | Status: DC
  Administered 2019-06-09: 16:00:00 via INTRAVENOUS

## 2019-06-09 MED ORDER — SODIUM CHLORIDE 0.9 % WEIGHT BASED INFUSION
1.0000 mL/kg/h | INTRAVENOUS | Status: DC
  Administered 2019-06-09: 1 mL/kg/h via INTRAVENOUS

## 2019-06-09 MED ORDER — SODIUM CHLORIDE 0.9% FLUSH
3.0000 mL | Freq: Two times a day (BID) | INTRAVENOUS | Status: DC
  Administered 2019-06-09: 3 mL via INTRAVENOUS

## 2019-06-09 MED ORDER — SODIUM CHLORIDE 0.9 % IV SOLN
750.0000 mg | INTRAVENOUS | Status: DC
  Filled 2019-06-09: qty 750

## 2019-06-09 MED ORDER — IOHEXOL 350 MG/ML SOLN
INTRAVENOUS | Status: DC | PRN
  Administered 2019-06-09: 60 mL via INTRA_ARTERIAL

## 2019-06-09 MED ORDER — HEPARIN (PORCINE) IN NACL 1000-0.9 UT/500ML-% IV SOLN
INTRAVENOUS | Status: AC
  Filled 2019-06-09: qty 500

## 2019-06-09 MED ORDER — SODIUM CHLORIDE 0.9% FLUSH: 3.0000 mL | Freq: Two times a day (BID) | INTRAVENOUS | Status: DC

## 2019-06-09 MED ORDER — EPINEPHRINE PF 1 MG/ML IJ SOLN
0.0000 ug/min | INTRAVENOUS | Status: DC
  Filled 2019-06-09: qty 4

## 2019-06-09 SURGICAL SUPPLY — 10 items
CATH OPTITORQUE TIG 4.0 5F (CATHETERS) ×2 IMPLANT
DEVICE RAD COMP TR BAND LRG (VASCULAR PRODUCTS) ×2 IMPLANT
GLIDESHEATH SLEND SS 6F .021 (SHEATH) ×2 IMPLANT
GUIDEWIRE INQWIRE 1.5J.035X260 (WIRE) ×1 IMPLANT
INQWIRE 1.5J .035X260CM (WIRE) ×2
KIT HEART LEFT (KITS) ×2 IMPLANT
PACK CARDIAC CATHETERIZATION (CUSTOM PROCEDURE TRAY) ×2 IMPLANT
TRANSDUCER W/STOPCOCK (MISCELLANEOUS) ×2 IMPLANT
TUBING CIL FLEX 10 FLL-RA (TUBING) ×2 IMPLANT
WIRE HI TORQ VERSACORE-J 145CM (WIRE) ×2 IMPLANT

## 2019-06-09 NOTE — Progress Notes (Signed)
Progress Note  Patient Name: Kimberly Bryant Date of Encounter: 06/09/2019  Primary Cardiologist: Armanda Magicraci Turner, MD   Subjective   Interperter was used. HS troponin bumped overnight.  Patient denies recurrent chest pain, N/V. No SOB.  Patient is agreeable to cardiac cath today.  Inpatient Medications    Scheduled Meds:  amLODipine  10 mg Oral Daily   aspirin EC  81 mg Oral Daily   atorvastatin  40 mg Oral Daily   insulin aspart  0-9 Units Subcutaneous Q4H   metoprolol succinate  25 mg Oral Daily   pantoprazole  40 mg Oral BID   Continuous Infusions:  sodium chloride 75 mL/hr at 06/09/19 0222   heparin 900 Units/hr (06/08/19 1839)   PRN Meds: acetaminophen, nitroGLYCERIN, ondansetron (ZOFRAN) IV   Vital Signs    Vitals:   06/09/19 0004 06/09/19 0034 06/09/19 0437 06/09/19 0815  BP: (!) 156/73 (!) 148/76 126/81 132/82  Pulse: 66  70   Resp:   14   Temp: 97.9 F (36.6 C)  (!) 97.4 F (36.3 C)   TempSrc: Oral  Oral   SpO2: 99%  100%   Weight:   87.8 kg   Height:   5' (1.524 m)     Intake/Output Summary (Last 24 hours) at 06/09/2019 0848 Last data filed at 06/09/2019 0500 Gross per 24 hour  Intake 935 ml  Output 950 ml  Net -15 ml   Last 3 Weights 06/09/2019 06/08/2019 06/07/2019  Weight (lbs) 193 lb 9.6 oz 197 lb 1.5 oz 197 lb  Weight (kg) 87.816 kg 89.4 kg 89.359 kg      Telemetry    NSR, HR 60-70s, no arrhythmias noted - Personally Reviewed  ECG    NSR, 61 bpm, LVH - Personally Reviewed  Physical Exam   GEN: No acute distress.   Neck: No JVD Cardiac: RRR, no murmurs, rubs, or gallops.  Respiratory: Clear to auscultation bilaterally. GI: Soft, nontender, non-distended  MS: No edema; No deformity. Neuro:  Nonfocal  Psych: Normal affect   Labs    High Sensitivity Troponin:   Recent Labs  Lab 06/08/19 1343 06/08/19 1630 06/08/19 1930 06/08/19 2110  TROPONINIHS 221* 893* 6,097* 7,850*      Cardiac EnzymesNo results for input(s): TROPONINI  in the last 168 hours. No results for input(s): TROPIPOC in the last 168 hours.   Chemistry Recent Labs  Lab 06/07/19 1500 06/08/19 1343 06/09/19 0237  NA 139 140 143  K 3.8 3.9 3.7  CL 106 106 108  CO2 26 21* 24  GLUCOSE 174* 243* 106*  BUN 14 17 11   CREATININE 1.07* 1.15* 0.87  CALCIUM 9.2 9.4 8.9  PROT 7.0 7.7  --   ALBUMIN 3.7 4.0  --   AST 16 24  --   ALT 17 22  --   ALKPHOS 61 65  --   BILITOT 0.6 0.3  --   GFRNONAA 56* 51* >60  GFRAA >60 59* >60  ANIONGAP 7 13 11      Hematology Recent Labs  Lab 06/07/19 1500 06/08/19 1343 06/09/19 0237  WBC 7.7 16.7* 15.6*  RBC 4.56 4.35 4.04  HGB 12.7 12.0 11.1*  HCT 40.2 37.7 34.7*  MCV 88.2 86.7 85.9  MCH 27.9 27.6 27.5  MCHC 31.6 31.8 32.0  RDW 12.8 12.8 12.9  PLT 284 267 263    BNPNo results for input(s): BNP, PROBNP in the last 168 hours.   DDimer No results for input(s): DDIMER in the last  168 hours.   Radiology    US Abdomen Limited  Result Date: 06/08/2019 CLINICAL DATA:  Tenderness to palpation in the right upper quadrant EXAM: ULTRASOUND ABDOMEN LIMITED RIGHT UPPER QUADRANT COMPARISON:  None. FINDINGS: Gallbladder: There are 2 echogenic, shadowing stones located dependently within the gallbladder lumen, the largest measuring 1.2 x 0.7 x 1.1 cm. No gallbladder wall thickening. No sonographic Murphy sign noted by sonographer. Common bile duct: Diameter: 3.5 mm Liver: No focal lesion identified. Within normal limits in parenchymal echogenicity. Portal vein is patent on color Doppler imaging with normal direction of blood flow towards the liver. Other: None. IMPRESSION: Cholelithiasis without sonographic evidence of acute cholecystitis. Electronically Signed   By: Davina Poke M.D.   On: 06/08/2019 16:03   Dg Chest Portable 1 View  Result Date: 06/08/2019 CLINICAL DATA:  Chest pain, shortness of breath EXAM: PORTABLE CHEST 1 VIEW COMPARISON:  11/10/2018 FINDINGS: The heart size and mediastinal contours are  within normal limits. Both lungs are clear. The visualized skeletal structures are unremarkable. IMPRESSION: No acute cardiopulmonary findings. Electronically Signed   By: Davina Poke M.D.   On: 06/08/2019 14:31   Ct Angio Chest/abd/pel For Dissection W And/or Wo Contrast  Result Date: 06/08/2019 CLINICAL DATA:  Questionable history of prior thoracic aortic dissection, chest pain radiating to upper back and right arm began last evening. EXAM: CT ANGIOGRAPHY CHEST, ABDOMEN AND PELVIS TECHNIQUE: Multidetector CT imaging through the chest, abdomen and pelvis was performed using the standard protocol during bolus administration of intravenous contrast. Multiplanar reconstructed images and MIPs were obtained and reviewed to evaluate the vascular anatomy. CONTRAST:  143mL OMNIPAQUE IOHEXOL 350 MG/ML SOLN COMPARISON:  Chest radiograph June 08, 2019 abdominal ultrasound June 08, 2019 FINDINGS: CTA CHEST FINDINGS Cardiovascular: Preferential opacification of the thoracic aorta. Evaluation of the aortic root is limited due to extensive cardiac pulsation artifact. No evidence of ascending or descending thoracic aortic aneurysm or dissection. No periaortic inflammation stranding. Atherosclerotic plaque within the normal caliber aorta. Normal heart size. No pericardial effusion. While the exam is not tailored for evaluation of pulmonary arteries no large central pulmonary embolism identified. Detection of smaller segmental and subsegmental pulmonary emboli is limited T2 both technique and cardiac pulsation artifact. No CT findings of right heart strain. Mediastinum/Nodes: No enlarged mediastinal or axillary lymph nodes. Thyroid gland, trachea, and esophagus demonstrate no significant findings. Lungs/Pleura: No consolidation, features of edema, pneumothorax, or effusion. Basilar in dependent areas of atelectasis. Musculoskeletal: No chest wall mass or suspicious bone lesions identified. Review of the MIP images confirms  the above findings. CTA ABDOMEN AND PELVIS FINDINGS VASCULAR Aorta: Normal caliber aorta without aneurysm, dissection, vasculitis or significant stenosis. Celiac: Patent without evidence of aneurysm, dissection, vasculitis or significant stenosis. SMA: Patent without evidence of aneurysm, dissection, vasculitis or significant stenosis. Renals: Both renal arteries are patent without evidence of aneurysm, dissection, vasculitis, fibromuscular dysplasia or significant stenosis. IMA: Atherosclerotic plaque at the ostium with minimal narrowing. Vessel appears patent without evidence of aneurysm, dissection, or vasculitis. Inflow: Crescentic thickening of the wall of the left common iliac artery may reflect eccentric soft plaque versus a small intramural hematoma with several rounded, narrow necked pools of enhancement likely reflecting intramural blood pool. No critical stenosis is identified. The visualized portions of the left internal and external iliac as well as the right iliac system demonstrate atheromatous disease of the native vessels without features of critical stenosis, aneurysm or vasculitis. Veins: No obvious venous abnormality within the limitations of this arterial phase  study. Review of the MIP images confirms the above findings. NON-VASCULAR Hepatobiliary: No focal liver abnormality is seen. No gallstones, gallbladder wall thickening, or biliary dilatation. Pancreas: Unremarkable. No pancreatic ductal dilatation or surrounding inflammatory changes. Spleen: Normal in size without focal abnormality. Adrenals/Urinary Tract: Adrenal glands are unremarkable. Kidneys are normal, without renal calculi, focal lesion, or hydronephrosis. Bladder is unremarkable. Stomach/Bowel: Distal esophagus, stomach and duodenal sweep are unremarkable. No bowel wall thickening or dilatation. No evidence of obstruction. A normal appendix is visualized. Lymphatic: No suspicious or enlarged lymph nodes in the included lymphatic  chains. Reproductive: Uterus is deviated slightly rightward but is otherwise unremarkable in appearance. No concerning adnexal lesions. Other: Ventral abdominal diastasis is noted without evidence of mechanical bowel obstruction. No free fluid or free gas within the abdomen or pelvis. Musculoskeletal: Multilevel degenerative changes in the spine are most pronounced at L5-S1. No acute or concerning osseous features. Review of the MIP images confirms the above findings. IMPRESSION: 1. No evidence of acute aortic syndrome. No evidence of aortic dissection. 2. Crescentic thickening of the wall of the left common iliac artery may reflect eccentric soft plaque versus a small intramural hematoma with several foci of intramural blood pool. No critical stenosis is identified. 3. No acute intrathoracic or intra-abdominal process. 4.  Aortic Atherosclerosis (ICD10-I70.0). These results were called by telephone at the time of interpretation on 06/08/2019 at 6:03 pm to Dr. Freida BusmanAllen, who verbally acknowledged these results. Electronically Signed   By: Kreg ShropshirePrice  DeHay M.D.   On: 06/08/2019 18:05    Cardiac Studies   Echo Pending  Patient Profile     63 y.o. female  hypertension, hyperlipidema, and GERD who is being seen today for the evaluation of chest pain.  Assessment & Plan    1.NSTEMI -Patient presented to ER yesterday for atypical chest pain, worse with eating and walking. No SOB, associated GI symptoms.  -HS troponin 893>6,097>7,859. Concern for ACS this morning -Hgb 11.1, Creatinine 0.87 -Echo pending -On IV heparin -Continue BB, statin, ASA -Patient denies recurrent chest pain, nausea, or vomiting. -Plan for cath today. Patient's last meal was yesterday morning. Risks and benefits of cardiac catheterization have been discussed with the patient.  These include bleeding, infection, kidney damage, stroke, heart attack, death.  The patient understands these risks and is willing to proceed.  2. Elevated  Troponin -893>6,097>7,859 -Concern for ACS -Cath today  3. HTN -Pressures reasonable -Amlodipine 5mg , Toprol 25mg   4. Hyperlipidemia -Increase to 80mg  daily -LDL 109 12/2018 -Recommend check LFTs and Lipids in 6-8 weeks   For questions or updates, please contact [facility] Please consult [email or web address] for contact info under        Signed, Meiya Wisler David StallH Petula Rotolo, PA-C  06/09/2019, 8:48 AM

## 2019-06-09 NOTE — Telephone Encounter (Signed)
I spoke to patient's daughter to schedule an appointment with Lurena Joiner per Dr Chapman Fitch and her daughter said that Mrs Kimberly Bryant is at the hospital when she get out she will call back to schedule an appointment with luke   for recheck of blood pressure and to see if another medication is needed in place of Zestril

## 2019-06-09 NOTE — Consult Note (Signed)
[address]       [place] [address]             [phone]        Kimberly Bryant [facility] Medical Record #[id number] Date of Birth: 1956-03-20  Referring: No ref. provider found Primary Care: Antony Blackbird, MD Primary Cardiologist:Traci Radford Pax, MD  Chief Complaint:    Chief Complaint  Patient presents with   Chest Pain   Shoulder Pain    History of Present Illness:     63 yo lady from [place] with h/o MI, HTN and possible angioedema related to ACE-I presented with worse chest pain which corresponded to rise in troponin. Underwent LHC today demonstrating severe multivessel CAD, consult for CABG. Stable hemodynamically. Denies CP now.    Current Activity/ Functional Status: Patient is independent with mobility/ambulation, transfers, ADL's, IADL's.   Zubrod Score: At the time of surgery this patients most appropriate activity status/level should be described as: []     0    Normal activity, no symptoms []     1    Restricted in physical strenuous activity but ambulatory, able to do out light work []     2    Ambulatory and capable of self care, unable to do work activities, up and about                 more than 50%  Of the time                            []     3    Only limited self care, in bed greater than 50% of waking hours []     4    Completely disabled, no self care, confined to bed or chair []     5    Moribund  Past Medical History:  Diagnosis Date   CAD (coronary artery disease)    Hyperlipidemia    Hypertension     History reviewed. No pertinent surgical history.  Social History   Tobacco Use  Smoking Status Never Smoker  Smokeless Tobacco Never Used    Social History   Substance and Sexual Activity  Alcohol Use Never   Frequency: Never     Allergies  Allergen Reactions   Carafate [Sucralfate] Swelling and Other (See Comments)    Patient stated it made her face swell (angioedema)- no breathing issues, however    Lisinopril Swelling and Other (See Comments)    Patient stated it made her face swell (angioedema)- no breathing issues, however    Current Facility-Administered Medications  Medication Dose Route Frequency Provider Last Rate Last Dose   0.9 %  sodium chloride infusion   Intravenous Continuous Blount, Lolita Cram, NP 75 mL/hr at 06/09/19 0222     0.9 %  sodium chloride infusion   Intravenous Continuous Troy Sine, MD 125 mL/hr at 06/09/19 1548     0.9 %  sodium chloride infusion  250 mL Intravenous PRN Troy Sine, MD       acetaminophen (TYLENOL) tablet 650 mg  650 mg Oral Q4H PRN Etta Quill, DO       amLODipine (NORVASC) tablet 10 mg  10 mg Oral Daily Jennette Kettle M, DO   10 mg at 06/09/19 1019   aspirin EC tablet 81 mg  81 mg Oral Daily Jennette Kettle M, DO   81 mg at 06/09/19 1019   atorvastatin (LIPITOR) tablet  80 mg  80 mg Oral q1800 Furth, Cadence H, PA-C       bisacodyl (DULCOLAX) EC tablet 5 mg  5 mg Oral Once Linden Dolin, MD       [START ON 06/10/2019] cefUROXime (ZINACEF) 1.5 g in sodium chloride 0.9 % 100 mL IVPB  1.5 g Intravenous To OR Alphonsus Doyel, Merri Brunette, MD       [START ON 06/10/2019] cefUROXime (ZINACEF) 750 mg in sodium chloride 0.9 % 100 mL IVPB  750 mg Intravenous To OR Kain Milosevic, Merri Brunette, MD       chlorhexidine (HIBICLENS) 4 % liquid 4 application  60 mL Topical Once Linden Dolin, MD       And   [START ON 06/10/2019] chlorhexidine (HIBICLENS) 4 % liquid 4 application  60 mL Topical Once Linden Dolin, MD       [START ON 06/10/2019] chlorhexidine (PERIDEX) 0.12 % solution 15 mL  15 mL Mouth/Throat Once Siddhanth Denk, Merri Brunette, MD       [START ON 06/10/2019] dexmedetomidine (PRECEDEX) 400 MCG/100ML (4 mcg/mL) infusion  0.1-0.7 mcg/kg/hr Intravenous To OR Harman Ferrin, Merri Brunette, MD       diazepam (VALIUM) tablet 5 mg  5 mg Oral Q6H PRN Lennette Bihari, MD       [START ON 06/10/2019] DOPamine (INTROPIN) 800 mg in dextrose 5 % 250 mL (3.2 mg/mL) infusion  0-10  mcg/kg/min Intravenous To OR Celie Desrochers, Merri Brunette, MD       [START ON 06/10/2019] EPINEPHrine (ADRENALIN) 4 mg in dextrose 5 % 250 mL (0.016 mg/mL) infusion  0-10 mcg/min Intravenous To OR Monzerrat Wellen, Merri Brunette, MD       [START ON 06/10/2019] heparin 2,500 Units, papaverine 30 mg in electrolyte-148 (PLASMALYTE-148) 500 mL irrigation   Irrigation To OR Ebany Bowermaster, Merri Brunette, MD       [START ON 06/10/2019] heparin 30,000 units/NS 1000 mL solution for CELLSAVER   Other To OR Linden Dolin, MD       [START ON 06/10/2019] heparin injection 5,000 Units  5,000 Units Subcutaneous Q8H Lennette Bihari, MD       hydrALAZINE (APRESOLINE) injection 10 mg  10 mg Intravenous Q20 Min PRN Lennette Bihari, MD       insulin aspart (novoLOG) injection 0-9 Units  0-9 Units Subcutaneous TID AC & HS Matcha, Anupama, MD       [START ON 06/10/2019] insulin regular, human (MYXREDLIN) 100 units/ 100 mL infusion   Intravenous To OR Roniqua Kintz, Merri Brunette, MD       isosorbide mononitrate (IMDUR) 24 hr tablet 30 mg  30 mg Oral Daily Lennette Bihari, MD   30 mg at 06/09/19 1544   labetalol (NORMODYNE) injection 10 mg  10 mg Intravenous Q10 min PRN Lennette Bihari, MD       [START ON 06/10/2019] magnesium sulfate (IV Push/IM) injection 40 mEq  40 mEq Other To OR Deloras Reichard, Merri Brunette, MD       metoprolol succinate (TOPROL-XL) 24 hr tablet 25 mg  25 mg Oral Daily Julian Reil, Jared M, DO   25 mg at 06/09/19 1019   [START ON 06/10/2019] metoprolol tartrate (LOPRESSOR) tablet 12.5 mg  12.5 mg Oral Once Linden Dolin, MD       [START ON 06/10/2019] milrinone (PRIMACOR) 20 MG/100 ML (0.2 mg/mL) infusion  0.3 mcg/kg/min Intravenous To OR Debra Colon Z, MD       nitroGLYCERIN (NITROSTAT) SL tablet 0.4 mg  0.4 mg Sublingual Q5 Min  x 3 PRN Hillary Bow, DO       [START ON 06/10/2019] nitroGLYCERIN 50 mg in dextrose 5 % 250 mL (0.2 mg/mL) infusion  2-200 mcg/min Intravenous To OR Kemonie Cutillo, Merri Brunette, MD       ondansetron (ZOFRAN) injection 4 mg  4 mg  Intravenous Q6H PRN Hillary Bow, DO       pantoprazole (PROTONIX) EC tablet 40 mg  40 mg Oral BID Lyda Perone M, DO   40 mg at 06/09/19 1019   [START ON 06/10/2019] phenylephrine (NEOSYNEPHRINE) 20-0.9 MG/250ML-% infusion  30-200 mcg/min Intravenous To OR Linden Dolin, MD       Melene Muller ON 06/10/2019] potassium chloride injection 80 mEq  80 mEq Other To OR Sabrena Gavitt, Merri Brunette, MD       sodium chloride flush (NS) 0.9 % injection 3 mL  3 mL Intravenous Q12H Furth, Cadence H, PA-C       sodium chloride flush (NS) 0.9 % injection 3 mL  3 mL Intravenous Q12H Lennette Bihari, MD       sodium chloride flush (NS) 0.9 % injection 3 mL  3 mL Intravenous PRN Lennette Bihari, MD       temazepam (RESTORIL) capsule 15 mg  15 mg Oral Once PRN Linden Dolin, MD       [START ON 06/10/2019] tranexamic acid (CYKLOKAPRON) 2,500 mg in sodium chloride 0.9 % 250 mL (10 mg/mL) infusion  1.5 mg/kg/hr Intravenous To OR Jiraiya Mcewan, Merri Brunette, MD       [START ON 06/10/2019] tranexamic acid (CYKLOKAPRON) bolus via infusion - over 30 minutes 1,317 mg  15 mg/kg Intravenous To OR Shadia Larose, Merri Brunette, MD       [START ON 06/10/2019] tranexamic acid (CYKLOKAPRON) pump prime solution 176 mg  2 mg/kg Intracatheter To OR Jethro Radke, Merri Brunette, MD       [START ON 06/10/2019] vancomycin (VANCOCIN) 1,000 mg in sodium chloride 0.9 % 1,000 mL irrigation   Irrigation To OR Elajah Kunsman, Merri Brunette, MD       [START ON 06/10/2019] vancomycin (VANCOCIN) 1,500 mg in sodium chloride 0.9 % 250 mL IVPB  1,500 mg Intravenous To OR Marlet Korte, Merri Brunette, MD        Medications Prior to Admission  Medication Sig Dispense Refill Last Dose   amLODipine (NORVASC) 10 MG tablet Take 1 tablet (10 mg total) by mouth daily. 90 tablet 3 06/05/2019 at Unknown time   atorvastatin (LIPITOR) 40 MG tablet Take 1 tablet (40 mg total) by mouth daily. 30 tablet 2 06/05/2019 at Unknown time   lansoprazole (PREVACID) 30 MG capsule Take 1 capsule (30 mg total) by mouth 2 (two)  times daily. To reduce stomach acid 60 capsule 2 06/05/2019 at Unknown time   metoprolol succinate (TOPROL-XL) 25 MG 24 hr tablet Take 1 tablet (25 mg total) by mouth daily. 30 tablet 3 06/05/2019 at 1000    Family History  Problem Relation Age of Onset   Hypertension Mother      Review of Systems:   ROS Pertinent items noted in HPI and remainder of comprehensive ROS otherwise negative.     Cardiac Review of Systems: Y or  [    ]= no  Chest Pain [    ]  Resting SOB [   ] Exertional SOB  [  ]  Orthopnea [  ]   Pedal Edema [   ]    Palpitations [  ] Syncope  [  ]  Presyncope [   ]  General Review of Systems: [Y] = yes [  ]=no Constitional: recent weight change [  n]; anorexia [  ]; fatigue [  ]; nausea [  ]; night sweats [ n ]; fever [  ]; or chills [  ]                                                               Dental: Last Dentist visit:   Eye : blurred vision [  ]; diplopia [   ]; vision changes [  ];  Amaurosis fugax[  ]; Resp: cough [n];  wheezing[ n ];  hemoptysis[  ]; shortness of breath[  ]; paroxysmal nocturnal dyspnea[  ]; dyspnea on exertion[  ]; or orthopnea[  ];  GI:  gallstones[  ], vomiting[  ];  dysphagia[  ]; melena[  ];  hematochezia [  ]; heartburn[  ];   Hx of  Colonoscopy[  ]; GU: kidney stones [  ]; hematuria[  ];   dysuria [  ];  nocturia[  ];  history of     obstruction [  ]; urinary frequency [  ]             Skin: rash, swelling[  ];, hair loss[  ];  peripheral edema[  ];  or itching[  ]; Musculosketetal: myalgias[  ];  joint swelling[  ];  joint erythema[  ];  joint pain[  ];  back pain[y  ];  Heme/Lymph: bruising[  ];  bleeding[n  ];  anemia[  ];  Neuro: TIA[n  ];  headaches[  ];  stroke[n  ];  vertigo[  ];  seizures[  ];   paresthesias[  ];  difficulty walking[  ];  Psych:depression[  ]; anxiety[  ];  Endocrine: diabetes[n  ];  thyroid dysfunction[  ];        Physical Exam: BP 135/79    Pulse (!) 51    Temp 98.5 F (36.9 C) (Oral)    Resp 13    Ht  5' (1.524 m)    Wt 87.8 kg    SpO2 96%    BMI 37.81 kg/m    General appearance: alert, cooperative and no distress Neck: no adenopathy, no carotid bruit, no JVD, supple, symmetrical, trachea midline and thyroid not enlarged, symmetric, no tenderness/mass/nodules Lymph nodes: Cervical, supraclavicular, and axillary nodes normal. Resp: clear to auscultation bilaterally Cardio: regular rate and rhythm, S1, S2 normal, no murmur, click, rub or gallop Extremities: extremities normal, atraumatic, no cyanosis or edema Neurologic: Grossly normal  Diagnostic Studies & Laboratory data:     Recent Radiology Findings:   [place] Abdomen Limited  Result Date: 06/08/2019 CLINICAL DATA:  Tenderness to palpation in the right upper quadrant EXAM: ULTRASOUND ABDOMEN LIMITED RIGHT UPPER QUADRANT COMPARISON:  None. FINDINGS: Gallbladder: There are 2 echogenic, shadowing stones located dependently within the gallbladder lumen, the largest measuring 1.2 x 0.7 x 1.1 cm. No gallbladder wall thickening. No sonographic Murphy sign noted by sonographer. Common bile duct: Diameter: 3.5 mm Liver: No focal lesion identified. Within normal limits in parenchymal echogenicity. Portal vein is patent on color Doppler imaging with normal direction of blood flow towards the liver. Other: None. IMPRESSION: Cholelithiasis without sonographic evidence of acute cholecystitis. Electronically Signed  By: Duanne GuessNicholas  Plundo M.D.   On: 06/08/2019 16:03   Dg Chest Portable 1 View  Result Date: 06/08/2019 CLINICAL DATA:  Chest pain, shortness of breath EXAM: PORTABLE CHEST 1 VIEW COMPARISON:  11/10/2018 FINDINGS: The heart size and mediastinal contours are within normal limits. Both lungs are clear. The visualized skeletal structures are unremarkable. IMPRESSION: No acute cardiopulmonary findings. Electronically Signed   By: Duanne GuessNicholas  Plundo M.D.   On: 06/08/2019 14:31   Ct Angio Chest/abd/pel For Dissection W And/or Wo Contrast  Result Date:  06/08/2019 CLINICAL DATA:  Questionable history of prior thoracic aortic dissection, chest pain radiating to upper back and right arm began last evening. EXAM: CT ANGIOGRAPHY CHEST, ABDOMEN AND PELVIS TECHNIQUE: Multidetector CT imaging through the chest, abdomen and pelvis was performed using the standard protocol during bolus administration of intravenous contrast. Multiplanar reconstructed images and MIPs were obtained and reviewed to evaluate the vascular anatomy. CONTRAST:  100mL OMNIPAQUE IOHEXOL 350 MG/ML SOLN COMPARISON:  Chest radiograph June 08, 2019 abdominal ultrasound June 08, 2019 FINDINGS: CTA CHEST FINDINGS Cardiovascular: Preferential opacification of the thoracic aorta. Evaluation of the aortic root is limited due to extensive cardiac pulsation artifact. No evidence of ascending or descending thoracic aortic aneurysm or dissection. No periaortic inflammation stranding. Atherosclerotic plaque within the normal caliber aorta. Normal heart size. No pericardial effusion. While the exam is not tailored for evaluation of pulmonary arteries no large central pulmonary embolism identified. Detection of smaller segmental and subsegmental pulmonary emboli is limited T2 both technique and cardiac pulsation artifact. No CT findings of right heart strain. Mediastinum/Nodes: No enlarged mediastinal or axillary lymph nodes. Thyroid gland, trachea, and esophagus demonstrate no significant findings. Lungs/Pleura: No consolidation, features of edema, pneumothorax, or effusion. Basilar in dependent areas of atelectasis. Musculoskeletal: No chest wall mass or suspicious bone lesions identified. Review of the MIP images confirms the above findings. CTA ABDOMEN AND PELVIS FINDINGS VASCULAR Aorta: Normal caliber aorta without aneurysm, dissection, vasculitis or significant stenosis. Celiac: Patent without evidence of aneurysm, dissection, vasculitis or significant stenosis. SMA: Patent without evidence of aneurysm,  dissection, vasculitis or significant stenosis. Renals: Both renal arteries are patent without evidence of aneurysm, dissection, vasculitis, fibromuscular dysplasia or significant stenosis. IMA: Atherosclerotic plaque at the ostium with minimal narrowing. Vessel appears patent without evidence of aneurysm, dissection, or vasculitis. Inflow: Crescentic thickening of the wall of the left common iliac artery may reflect eccentric soft plaque versus a small intramural hematoma with several rounded, narrow necked pools of enhancement likely reflecting intramural blood pool. No critical stenosis is identified. The visualized portions of the left internal and external iliac as well as the right iliac system demonstrate atheromatous disease of the native vessels without features of critical stenosis, aneurysm or vasculitis. Veins: No obvious venous abnormality within the limitations of this arterial phase study. Review of the MIP images confirms the above findings. NON-VASCULAR Hepatobiliary: No focal liver abnormality is seen. No gallstones, gallbladder wall thickening, or biliary dilatation. Pancreas: Unremarkable. No pancreatic ductal dilatation or surrounding inflammatory changes. Spleen: Normal in size without focal abnormality. Adrenals/Urinary Tract: Adrenal glands are unremarkable. Kidneys are normal, without renal calculi, focal lesion, or hydronephrosis. Bladder is unremarkable. Stomach/Bowel: Distal esophagus, stomach and duodenal sweep are unremarkable. No bowel wall thickening or dilatation. No evidence of obstruction. A normal appendix is visualized. Lymphatic: No suspicious or enlarged lymph nodes in the included lymphatic chains. Reproductive: Uterus is deviated slightly rightward but is otherwise unremarkable in appearance. No concerning adnexal lesions. Other: Ventral abdominal diastasis  is noted without evidence of mechanical bowel obstruction. No free fluid or free gas within the abdomen or pelvis.  Musculoskeletal: Multilevel degenerative changes in the spine are most pronounced at L5-S1. No acute or concerning osseous features. Review of the MIP images confirms the above findings. IMPRESSION: 1. No evidence of acute aortic syndrome. No evidence of aortic dissection. 2. Crescentic thickening of the wall of the left common iliac artery may reflect eccentric soft plaque versus a small intramural hematoma with several foci of intramural blood pool. No critical stenosis is identified. 3. No acute intrathoracic or intra-abdominal process. 4.  Aortic Atherosclerosis (ICD10-I70.0). These results were called by telephone at the time of interpretation on 06/08/2019 at 6:03 pm to Dr. Freida BusmanAllen, who verbally acknowledged these results. Electronically Signed   By: Kreg ShropshirePrice  DeHay M.D.   On: 06/08/2019 18:05     I have independently reviewed the above radiologic studies and discussed with the patient   Recent Lab Findings: Lab Results  Component Value Date   WBC 15.6 (H) 06/09/2019   HGB 11.1 (L) 06/09/2019   HCT 34.7 (L) 06/09/2019   PLT 263 06/09/2019   GLUCOSE 106 (H) 06/09/2019   CHOL 177 12/10/2018   TRIG 68 12/10/2018   HDL 54 12/10/2018   LDLCALC 109 (H) 12/10/2018   ALT 22 06/08/2019   AST 24 06/08/2019   NA 143 06/09/2019   K 3.7 06/09/2019   CL 108 06/09/2019   CREATININE 0.87 06/09/2019   BUN 11 06/09/2019   CO2 24 06/09/2019   HGBA1C 5.9 (H) 06/09/2019      Assessment / Plan:      Pleasant 63 yo lady is interviewed via interpreter due to language barrier. The extent of her severe CAD is thoroughly explained as is the rationale for surgery as best therapy. She expresses understanding and wishes to proceed, looking forward to returning to work. CABG in AM    I  spent 30 minutes counseling the patient face to face.   Brantley FlingB. Zane Annisha Baar, MD 06/09/2019 5:28 PM

## 2019-06-09 NOTE — Interval H&P Note (Signed)
Cath Lab Visit (complete for each Cath Lab visit)  Clinical Evaluation Leading to the Procedure:   ACS: Yes.    Non-ACS:    Anginal Classification: CCS III  Anti-ischemic medical therapy: Maximal Therapy (2 or more classes of medications)  Non-Invasive Test Results: No non-invasive testing performed  Prior CABG: No previous CABG      History and Physical Interval Note:  06/09/2019 1:58 PM  Kimberly Bryant  has presented today for surgery, with the diagnosis of chest pain - n stemi.  The various methods of treatment have been discussed with the patient and family. After consideration of risks, benefits and other options for treatment, the patient has consented to  Procedure(s): LEFT HEART CATH AND CORONARY ANGIOGRAPHY (N/A) as a surgical intervention.  The patient's history has been reviewed, patient examined, no change in status, stable for surgery.  I have reviewed the patient's chart and labs.  Questions were answered to the patient's satisfaction.     Shelva Majestic

## 2019-06-09 NOTE — Progress Notes (Signed)
PROGRESS NOTE    Kimberly Bryant  ZOX:[id number] DOB: 07/22/1956 DOA: 06/08/2019 PCP: Cain SaupeFulp, Cammie, MD    Brief Narrative:  Kimberly Bryant is a 63 y.o. female with medical history significant of reported prior MI back in [place], HTN, HLD, GERD. Patient seen in ED on 8/2 with angioedema.  Believed to be secondary to ACEi use.  ACEi stopped and patient discharged home. That evening patient developed CP.  Pain with radiation to upper back and R arm.  Associated Nausea and vomiting when she eats anything.  EMS called.  Initial BP reported at 212/112 that decreased to 146/102 after NTG.  ED Course: CP persistent in the ED despite NTG and BP improvement, associated vomiting while in ED. Initial trop 221 repeat 893. EKG no ST elevation. CT aortogram without aortic dissection, also no large central PE nor CT evidence of R heart strain. Facial swelling and itching remains resolved.  06/09/2019: Patient underwent heart cath today.  Chest pain better per patient.  Per report she has severe three-vessel coronary obstructive disease.  Cardiology recommended surgical consultation for CABG.   Assessment & Plan:   Principal Problem:   ACS (acute coronary syndrome) (HCC) Active Problems:   HTN (hypertension)   Hyperlipidemia   Atypical chest pain   Elevated troponin   Hypertensive urgency   NSTEMI (non-ST elevated myocardial infarction) (HCC)  1. CP : Secondary to NSTEMI.  She had elevated troponins.  Status post cardiac cath today.  Per report severe three-vessel coronary obstructive disease.  Cardiology recommended surgical consultation for CABG.  On aspirin, statins per cardiology. 2. HTN - - ACEi believed to cause angioedema this weekend. -Currently on metoprolol, Imdur, Norvasc.  Continue to monitor blood pressure closely and adjust medications as needed.  3. HLD - continue statin 4. Hyperglycemia - BGL 243 -Hemoglobin A1c 5.9. -Initial blood glucose was high but now stable.  Continue Accu-Cheks,  insulin sliding scale.  DVT prophylaxis: Heparin  Code Status: Full Family Communication:  Patient does not speak [place].  Interpreter machine was used to communicate with family and they are aware of the plan. Disposition Plan: TBD Consults called: Cardiology   Antimicrobials:   None    Subjective: Patient does not speak [place].  No chest pain at this time.  Objective: Vitals:   06/09/19 1506 06/09/19 1521 06/09/19 1536 06/09/19 1551  BP: 128/77 130/77 129/77 135/79  Pulse: (!) 57 (!) 53 (!) 51 (!) 51  Resp:      Temp:      TempSrc:      SpO2: 95% 97% 96% 96%  Weight:      Height:        Intake/Output Summary (Last 24 hours) at 06/09/2019 1726 Last data filed at 06/09/2019 1500 Gross per 24 hour  Intake 965 ml  Output 2550 ml  Net -1585 ml   Filed Weights   06/08/19 1700 06/09/19 0437  Weight: 89.4 kg 87.8 kg    Examination:  General exam: Appears calm and comfortable  Respiratory system: Clear to auscultation. Respiratory effort normal. Cardiovascular system: S1 & S2 heard Gastrointestinal system: Abdomen is obese, soft and nontender. Normal bowel sounds heard. Central nervous system: Alert and oriented. No focal neurological deficits. Extremities: Symmetric 5 x 5 power. Psychiatry: Normal affect     Data Reviewed: I have personally reviewed following labs and imaging studies  CBC: Recent Labs  Lab 06/07/19 1500 06/08/19 1343 06/09/19 0237  WBC 7.7 16.7* 15.6*  NEUTROABS 5.4  --   --  HGB 12.7 12.0 11.1*  HCT 40.2 37.7 34.7*  MCV 88.2 86.7 85.9  PLT 284 267 993   Basic Metabolic Panel: Recent Labs  Lab 06/07/19 1500 06/08/19 1343 06/08/19 1930 06/09/19 0237  NA 139 140  --  143  K 3.8 3.9  --  3.7  CL 106 106  --  108  CO2 26 21*  --  24  GLUCOSE 174* 243*  --  106*  BUN 14 17  --  11  CREATININE 1.07* 1.15*  --  0.87  CALCIUM 9.2 9.4  --  8.9  MG  --   --  2.3  --    GFR: Estimated Creatinine Clearance: 66 mL/min (by C-G  formula based on SCr of 0.87 mg/dL). Liver Function Tests: Recent Labs  Lab 06/07/19 1500 06/08/19 1343  AST 16 24  ALT 17 22  ALKPHOS 61 65  BILITOT 0.6 0.3  PROT 7.0 7.7  ALBUMIN 3.7 4.0   Recent Labs  Lab 06/07/19 1500 06/08/19 1343  LIPASE 36 24   No results for input(s): AMMONIA in the last 168 hours. Coagulation Profile: No results for input(s): INR, PROTIME in the last 168 hours. Cardiac Enzymes: No results for input(s): CKTOTAL, CKMB, CKMBINDEX, TROPONINI in the last 168 hours. BNP (last 3 results) No results for input(s): PROBNP in the last 8760 hours. HbA1C: Recent Labs    06/09/19 0237  HGBA1C 5.9*   CBG: Recent Labs  Lab 06/09/19 0006 06/09/19 0449 06/09/19 0746 06/09/19 1227 06/09/19 1621  GLUCAP 168* 76 87 73 71   Lipid Profile: No results for input(s): CHOL, HDL, LDLCALC, TRIG, CHOLHDL, LDLDIRECT in the last 72 hours. Thyroid Function Tests: No results for input(s): TSH, T4TOTAL, FREET4, T3FREE, THYROIDAB in the last 72 hours. Anemia Panel: No results for input(s): VITAMINB12, FOLATE, FERRITIN, TIBC, IRON, RETICCTPCT in the last 72 hours. Sepsis Labs: No results for input(s): PROCALCITON, LATICACIDVEN in the last 168 hours.  Recent Results (from the past 240 hour(s))  SARS Coronavirus 2 [facility] order, Performed in [facility] hospital lab) Nasopharyngeal Nasopharyngeal Swab     Status: None   Collection Time: 06/08/19  6:32 PM   Specimen: Nasopharyngeal Swab  Result Value Ref Range Status   SARS Coronavirus 2 NEGATIVE NEGATIVE Final    Comment: (NOTE) If result is NEGATIVE SARS-CoV-2 target nucleic acids are NOT DETECTED. The SARS-CoV-2 RNA is generally detectable in upper and lower  respiratory specimens during the acute phase of infection. The lowest  concentration of SARS-CoV-2 viral copies this assay can detect is 250  copies / mL. A negative result does not preclude SARS-CoV-2 infection  and should not be used as the sole basis for  treatment or other  patient management decisions.  A negative result may occur with  improper specimen collection / handling, submission of specimen other  than nasopharyngeal swab, presence of viral mutation(s) within the  areas targeted by this assay, and inadequate number of viral copies  (<250 copies / mL). A negative result must be combined with clinical  observations, patient history, and epidemiological information. If result is POSITIVE SARS-CoV-2 target nucleic acids are DETECTED. The SARS-CoV-2 RNA is generally detectable in upper and lower  respiratory specimens dur ing the acute phase of infection.  Positive  results are indicative of active infection with SARS-CoV-2.  Clinical  correlation with patient history and other diagnostic information is  necessary to determine patient infection status.  Positive results do  not rule out bacterial infection or  co-infection with other viruses. If result is PRESUMPTIVE POSTIVE SARS-CoV-2 nucleic acids MAY BE PRESENT.   A presumptive positive result was obtained on the submitted specimen  and confirmed on repeat testing.  While 2019 novel coronavirus  (SARS-CoV-2) nucleic acids may be present in the submitted sample  additional confirmatory testing may be necessary for epidemiological  and / or clinical management purposes  to differentiate between  SARS-CoV-2 and other Sarbecovirus currently known to infect humans.  If clinically indicated additional testing with an alternate test  methodology [id number]) is advised. The SARS-CoV-2 RNA is generally  detectable in upper and lower respiratory sp ecimens during the acute  phase of infection. The expected result is Negative. Fact Sheet for Patients:  BoilerBrush.com.cyhttps://www.fda.gov/media/136312/download Fact Sheet for Healthcare Providers: https://pope.com/https://www.fda.gov/media/136313/download This test is not yet approved or cleared by the [place] FDA and has been authorized for detection and/or  diagnosis of SARS-CoV-2 by FDA under an Emergency Use Authorization (EUA).  This EUA will remain in effect (meaning this test can be used) for the duration of the COVID-19 declaration under Section 564(b)(1) of the Act, 21 U.S.C. section 360bbb-3(b)(1), unless the authorization is terminated or revoked sooner. Performed at [facility], [address] [place], [place] [address]          Radiology Studies: Kore[place] Abdomen Limited  Result Date: 06/08/2019 CLINICAL DATA:  Tenderness to palpation in the right upper quadrant EXAM: ULTRASOUND ABDOMEN LIMITED RIGHT UPPER QUADRANT COMPARISON:  None. FINDINGS: Gallbladder: There are 2 echogenic, shadowing stones located dependently within the gallbladder lumen, the largest measuring 1.2 x 0.7 x 1.1 cm. No gallbladder wall thickening. No sonographic Murphy sign noted by sonographer. Common bile duct: Diameter: 3.5 mm Liver: No focal lesion identified. Within normal limits in parenchymal echogenicity. Portal vein is patent on color Doppler imaging with normal direction of blood flow towards the liver. Other: None. IMPRESSION: Cholelithiasis without sonographic evidence of acute cholecystitis. Electronically Signed   By: Duanne GuessNicholas  Plundo M.D.   On: 06/08/2019 16:03   Dg Chest Portable 1 View  Result Date: 06/08/2019 CLINICAL DATA:  Chest pain, shortness of breath EXAM: PORTABLE CHEST 1 VIEW COMPARISON:  11/10/2018 FINDINGS: The heart size and mediastinal contours are within normal limits. Both lungs are clear. The visualized skeletal structures are unremarkable. IMPRESSION: No acute cardiopulmonary findings. Electronically Signed   By: Duanne GuessNicholas  Plundo M.D.   On: 06/08/2019 14:31   Ct Angio Chest/abd/pel For Dissection W And/or Wo Contrast  Result Date: 06/08/2019 CLINICAL DATA:  Questionable history of prior thoracic aortic dissection, chest pain radiating to upper back and right arm began last evening. EXAM: CT ANGIOGRAPHY CHEST, ABDOMEN AND PELVIS  TECHNIQUE: Multidetector CT imaging through the chest, abdomen and pelvis was performed using the standard protocol during bolus administration of intravenous contrast. Multiplanar reconstructed images and MIPs were obtained and reviewed to evaluate the vascular anatomy. CONTRAST:  100mL OMNIPAQUE IOHEXOL 350 MG/ML SOLN COMPARISON:  Chest radiograph June 08, 2019 abdominal ultrasound June 08, 2019 FINDINGS: CTA CHEST FINDINGS Cardiovascular: Preferential opacification of the thoracic aorta. Evaluation of the aortic root is limited due to extensive cardiac pulsation artifact. No evidence of ascending or descending thoracic aortic aneurysm or dissection. No periaortic inflammation stranding. Atherosclerotic plaque within the normal caliber aorta. Normal heart size. No pericardial effusion. While the exam is not tailored for evaluation of pulmonary arteries no large central pulmonary embolism identified. Detection of smaller segmental and subsegmental pulmonary emboli is limited T2 both technique and cardiac pulsation artifact.  No CT findings of right heart strain. Mediastinum/Nodes: No enlarged mediastinal or axillary lymph nodes. Thyroid gland, trachea, and esophagus demonstrate no significant findings. Lungs/Pleura: No consolidation, features of edema, pneumothorax, or effusion. Basilar in dependent areas of atelectasis. Musculoskeletal: No chest wall mass or suspicious bone lesions identified. Review of the MIP images confirms the above findings. CTA ABDOMEN AND PELVIS FINDINGS VASCULAR Aorta: Normal caliber aorta without aneurysm, dissection, vasculitis or significant stenosis. Celiac: Patent without evidence of aneurysm, dissection, vasculitis or significant stenosis. SMA: Patent without evidence of aneurysm, dissection, vasculitis or significant stenosis. Renals: Both renal arteries are patent without evidence of aneurysm, dissection, vasculitis, fibromuscular dysplasia or significant stenosis. IMA:  Atherosclerotic plaque at the ostium with minimal narrowing. Vessel appears patent without evidence of aneurysm, dissection, or vasculitis. Inflow: Crescentic thickening of the wall of the left common iliac artery may reflect eccentric soft plaque versus a small intramural hematoma with several rounded, narrow necked pools of enhancement likely reflecting intramural blood pool. No critical stenosis is identified. The visualized portions of the left internal and external iliac as well as the right iliac system demonstrate atheromatous disease of the native vessels without features of critical stenosis, aneurysm or vasculitis. Veins: No obvious venous abnormality within the limitations of this arterial phase study. Review of the MIP images confirms the above findings. NON-VASCULAR Hepatobiliary: No focal liver abnormality is seen. No gallstones, gallbladder wall thickening, or biliary dilatation. Pancreas: Unremarkable. No pancreatic ductal dilatation or surrounding inflammatory changes. Spleen: Normal in size without focal abnormality. Adrenals/Urinary Tract: Adrenal glands are unremarkable. Kidneys are normal, without renal calculi, focal lesion, or hydronephrosis. Bladder is unremarkable. Stomach/Bowel: Distal esophagus, stomach and duodenal sweep are unremarkable. No bowel wall thickening or dilatation. No evidence of obstruction. A normal appendix is visualized. Lymphatic: No suspicious or enlarged lymph nodes in the included lymphatic chains. Reproductive: Uterus is deviated slightly rightward but is otherwise unremarkable in appearance. No concerning adnexal lesions. Other: Ventral abdominal diastasis is noted without evidence of mechanical bowel obstruction. No free fluid or free gas within the abdomen or pelvis. Musculoskeletal: Multilevel degenerative changes in the spine are most pronounced at L5-S1. No acute or concerning osseous features. Review of the MIP images confirms the above findings. IMPRESSION:  1. No evidence of acute aortic syndrome. No evidence of aortic dissection. 2. Crescentic thickening of the wall of the left common iliac artery may reflect eccentric soft plaque versus a small intramural hematoma with several foci of intramural blood pool. No critical stenosis is identified. 3. No acute intrathoracic or intra-abdominal process. 4.  Aortic Atherosclerosis (ICD10-I70.0). These results were called by telephone at the time of interpretation on 06/08/2019 at 6:03 pm to Dr. Freida Busman, who verbally acknowledged these results. Electronically Signed   By: Kreg Shropshire M.D.   On: 06/08/2019 18:05    Scheduled Meds: . amLODipine  10 mg Oral Daily  . aspirin EC  81 mg Oral Daily  . atorvastatin  80 mg Oral q1800  . bisacodyl  5 mg Oral Once  . chlorhexidine  60 mL Topical Once   And  . [START ON 06/10/2019] chlorhexidine  60 mL Topical Once  . [START ON 06/10/2019] chlorhexidine  15 mL Mouth/Throat Once  . [START ON 06/10/2019] heparin-papaverine-plasmalyte irrigation   Irrigation To OR  . [START ON 06/10/2019] heparin  5,000 Units Subcutaneous Q8H  . insulin aspart  0-9 Units Subcutaneous TID AC & HS  . [START ON 06/10/2019] insulin   Intravenous To OR  . isosorbide mononitrate  30 mg Oral Daily  . [START ON 06/10/2019] magnesium sulfate  40 mEq Other To OR  . metoprolol succinate  25 mg Oral Daily  . [START ON 06/10/2019] metoprolol tartrate  12.5 mg Oral Once  . pantoprazole  40 mg Oral BID  . [START ON 06/10/2019] phenylephrine  30-200 mcg/min Intravenous To OR  . [START ON 06/10/2019] potassium chloride  80 mEq Other To OR  . sodium chloride flush  3 mL Intravenous Q12H  . sodium chloride flush  3 mL Intravenous Q12H  . [START ON 06/10/2019] tranexamic acid  15 mg/kg Intravenous To OR  . [START ON 06/10/2019] tranexamic acid  2 mg/kg Intracatheter To OR  . [START ON 06/10/2019] vancomycin 1000 mg in NS (1000 ml) irrigation for Dr. Cornelius Moraswen case   Irrigation To OR   Continuous Infusions: . sodium chloride 75  mL/hr at 06/09/19 0222  . sodium chloride 125 mL/hr at 06/09/19 1548  . sodium chloride    . [START ON 06/10/2019] cefUROXime (ZINACEF)  IV    . [START ON 06/10/2019] cefUROXime (ZINACEF)  IV    . [START ON 06/10/2019] dexmedetomidine    . [START ON 06/10/2019] DOPamine    . [START ON 06/10/2019] epinephrine    . [START ON 06/10/2019] heparin 30,000 units/NS 1000 mL solution for CELLSAVER    . [START ON 06/10/2019] milrinone    . [START ON 06/10/2019] nitroGLYCERIN    . [START ON 06/10/2019] tranexamic acid (CYKLOKAPRON) infusion ([facility])    . [START ON 06/10/2019] vancomycin       LOS: 1 day    Time spent: 35 min    Vonzella NippleAnupama Micaela Stith, MD [facility] Pager on amion  If 7PM-7AM, please contact night-coverage [email or web address] Password [email or web address] 06/09/2019, 5:26 PM

## 2019-06-09 NOTE — Progress Notes (Signed)
  Echocardiogram 2D Echocardiogram has been performed.  Kimberly Bryant 06/09/2019, 11:34 AM

## 2019-06-09 NOTE — Progress Notes (Signed)
Pre-CABG Dopplers completed. Refer to "CV Proc" under chart review to view preliminary results.  06/09/2019 6:41 PM Maudry Mayhew, MHA, RVT, RDCS, RDMS

## 2019-06-09 NOTE — Anesthesia Preprocedure Evaluation (Addendum)
Anesthesia Evaluation  Patient identified by MRN, date of birth, ID band Patient awake    Reviewed: Allergy & Precautions, NPO status , Patient's Chart, lab work & pertinent test results  Airway Mallampati: II  TM Distance: <3 FB Neck ROM: Full    Dental  (+) Teeth Intact, Dental Advisory Given   Pulmonary neg pulmonary ROS,    Pulmonary exam normal        Cardiovascular hypertension, + CAD and + Past MI   Rhythm:Regular Rate:Bradycardia     Neuro/Psych negative neurological ROS  negative psych ROS   GI/Hepatic Neg liver ROS, GERD  ,  Endo/Other  negative endocrine ROS  Renal/GU negative Renal ROS     Musculoskeletal   Abdominal (+) + obese,   Peds  Hematology negative hematology ROS (+)   Anesthesia Other Findings   Reproductive/Obstetrics                            Echo:  1. The left ventricle has hyperdynamic systolic function, with an ejection fraction of >65%. The cavity size was normal. There is severely increased left ventricular wall thickness. Left ventricular diastolic Doppler parameters are consistent with  pseudonormalization. Elevated mean left atrial pressure No evidence of left ventricular regional wall motion abnormalities.  2. The right ventricle has normal systolic function. The cavity was normal. There is no increase in right ventricular wall thickness. Right ventricular systolic pressure is mildly elevated with an estimated pressure of 32.1 mmHg.  3. Left atrial size was moderately dilated.  4. Right atrial size was mildly dilated.  5. Mild thickening of the mitral valve leaflet. Mitral valve regurgitation is moderate by color flow Doppler.  6. The aorta is normal in size and structure.  7. The aortic root is normal in size and structure.  8. The inferior vena cava was dilated in size with <50% respiratory variability.  Anesthesia Physical Anesthesia Plan  ASA:  IV  Anesthesia Plan: General   Post-op Pain Management:    Induction: Intravenous  PONV Risk Score and Plan: 3 and Ondansetron and Treatment may vary due to age or medical condition  Airway Management Planned: Oral ETT  Additional Equipment: PA Cath, CVP, Arterial line, TEE and Ultrasound Guidance Line Placement  Intra-op Plan:   Post-operative Plan: Post-operative intubation/ventilation  Informed Consent:   Plan Discussed with: CRNA  Anesthesia Plan Comments:         Anesthesia Quick Evaluation

## 2019-06-09 NOTE — Progress Notes (Signed)
[place] for Heparin  Indication: chest pain/ACS  Allergies  Allergen Reactions  . Carafate [Sucralfate] Swelling and Other (See Comments)    Patient stated it made her face swell (angioedema)- no breathing issues, however  . Lisinopril Swelling and Other (See Comments)    Patient stated it made her face swell (angioedema)- no breathing issues, however    Patient Measurements: Height: 5\' 4"  (162.6 cm)(From ED visit 06/07/19) Weight: 197 lb 1.5 oz (89.4 kg)(From ED visit 06/07/19) IBW/kg (Calculated) : 54.7 Heparin Dosing Weight: 74.7 kg  Vital Signs: Temp: 97.9 F (36.6 C) (08/04 0004) Temp Source: Oral (08/04 0004) BP: 148/76 (08/04 0034) Pulse Rate: 66 (08/04 0004)  Labs: Recent Labs    06/07/19 1500  06/08/19 1343 06/08/19 1630 06/08/19 1930 06/08/19 2110 06/09/19 0237  HGB 12.7  --  12.0  --   --   --  11.1*  HCT 40.2  --  37.7  --   --   --  34.7*  PLT 284  --  267  --   --   --  263  HEPARINUNFRC  --   --   --   --   --   --  0.50  CREATININE 1.07*  --  1.15*  --   --   --  0.87  TROPONINIHS  --    < > 221* 893* 6,097* 7,850*  --    < > = values in this interval not displayed.    Estimated Creatinine Clearance: 72.6 mL/min (by C-G formula based on SCr of 0.87 mg/dL).   Medical History: Past Medical History:  Diagnosis Date  . CAD (coronary artery disease)   . Hyperlipidemia   . Hypertension     Assessment: 63 year old female with history of CAD and HTN who presents to ED with chest pain, abdominal pain, and nausea. Of note, she was seen in ED yesterday with angioedema and instructed to stop lisinopril. Patient was not on anticoagulants prior to admission.   H/H/Plts are all within normal limits.  Trop High Sensitivity has increased to [place] has been consulted to start IV Heparin for ACS.   8/4 AM update: initial heparin level therapeutic, high sensitivity trop up to 7850  Goal of Therapy:  Heparin level  0.3-0.7 units/ml Monitor platelets by anticoagulation protocol: Yes   Plan:  Cont heparin at 900 units/hr Confirmatory heparin level at 1100 Daily Heparin level and CBC while on therapy.   Narda Bonds, PharmD, BCPS Clinical Pharmacist Phone: [phone]

## 2019-06-09 NOTE — Progress Notes (Signed)
ANTICOAGULATION CONSULT NOTE - Follow Up Consult  Pharmacy Consult for Heparin Indication: NSTEMI  Allergies  Allergen Reactions  . Carafate [Sucralfate] Swelling and Other (See Comments)    Patient stated it made her face swell (angioedema)- no breathing issues, however  . Lisinopril Swelling and Other (See Comments)    Patient stated it made her face swell (angioedema)- no breathing issues, however    Patient Measurements: Height: 5' (152.4 cm) Weight: 193 lb 9.6 oz (87.8 kg) IBW/kg (Calculated) : 45.5 Heparin Dosing Weight:    Vital Signs: Temp: 98.5 F (36.9 C) (08/04 1100) Temp Source: Oral (08/04 1100) BP: 134/79 (08/04 1100) Pulse Rate: 56 (08/04 1100)  Labs: Recent Labs    06/07/19 1500  06/08/19 1343 06/08/19 1630 06/08/19 1930 06/08/19 2110 06/09/19 0237 06/09/19 1204  HGB 12.7  --  12.0  --   --   --  11.1*  --   HCT 40.2  --  37.7  --   --   --  34.7*  --   PLT 284  --  267  --   --   --  263  --   HEPARINUNFRC  --   --   --   --   --   --  0.50 0.34  CREATININE 1.07*  --  1.15*  --   --   --  0.87  --   TROPONINIHS  --    < > 221* 893* 6,097* 7,850*  --   --    < > = values in this interval not displayed.    Estimated Creatinine Clearance: 66 mL/min (by C-G formula based on SCr of 0.87 mg/dL).  Assessment:  Anticoag: Heparin for NSTEMI.  No AC PTA. HL 0.34 in goal. Hgb 12.7>11.1. Plts WNL.  Goal of Therapy:  Heparin level 0.3-0.7 units/ml Monitor platelets by anticoagulation protocol: Yes   Plan:  Heparin drip at 900 units/hr. Daily Heparin level and CBC while on therapy.  F/u Echo result Cath today  Dezaria Methot S. Alford Highland, PharmD, [place] Clinical Staff Pharmacist Eilene Ghazi Stillinger 06/09/2019,1:01 PM

## 2019-06-10 ENCOUNTER — Encounter (HOSPITAL_COMMUNITY): Payer: Self-pay | Admitting: Cardiovascular Disease

## 2019-06-10 ENCOUNTER — Other Ambulatory Visit: Payer: Self-pay

## 2019-06-10 ENCOUNTER — Inpatient Hospital Stay (HOSPITAL_COMMUNITY): Payer: 59 | Admitting: Certified Registered Nurse Anesthetist

## 2019-06-10 ENCOUNTER — Inpatient Hospital Stay (HOSPITAL_COMMUNITY): Payer: 59

## 2019-06-10 ENCOUNTER — Inpatient Hospital Stay (HOSPITAL_COMMUNITY)
Admission: EM | Disposition: A | Discharge status: Home Health | Payer: Self-pay | Source: Home / Self Care | Attending: Cardiothoracic Surgery

## 2019-06-10 DIAGNOSIS — Z951 Presence of aortocoronary bypass graft: Secondary | ICD-10-CM

## 2019-06-10 DIAGNOSIS — I249 Acute ischemic heart disease, unspecified: Secondary | ICD-10-CM

## 2019-06-10 DIAGNOSIS — I25110 Atherosclerotic heart disease of native coronary artery with unstable angina pectoris: Secondary | ICD-10-CM

## 2019-06-10 HISTORY — PX: TEE WITHOUT CARDIOVERSION: SHX5443

## 2019-06-10 HISTORY — PX: CORONARY ARTERY BYPASS GRAFT: SHX141

## 2019-06-10 LAB — GLUCOSE, CAPILLARY
Glucose-Capillary: 124 mg/dL — ABNORMAL HIGH (ref 70–99)
Glucose-Capillary: 68 mg/dL — ABNORMAL LOW (ref 70–99)

## 2019-06-10 LAB — POCT I-STAT 7, (LYTES, BLD GAS, ICA,H+H)
Acid-base deficit: 2 mmol/L (ref 0.0–2.0)
Acid-base deficit: 3 mmol/L — ABNORMAL HIGH (ref 0.0–2.0)
Acid-base deficit: 3 mmol/L — ABNORMAL HIGH (ref 0.0–2.0)
Bicarbonate: 25.3 mmol/L (ref 20.0–28.0)
Bicarbonate: 22.9 mmol/L (ref 20.0–28.0)
Bicarbonate: 23 mmol/L (ref 20.0–28.0)
Bicarbonate: 23.1 mmol/L (ref 20.0–28.0)
Calcium, Ion: 1.04 mmol/L — ABNORMAL LOW (ref 1.15–1.40)
Calcium, Ion: 1.11 mmol/L — ABNORMAL LOW (ref 1.15–1.40)
Calcium, Ion: 1.16 mmol/L (ref 1.15–1.40)
Calcium, Ion: 1.18 mmol/L (ref 1.15–1.40)
HCT: 24 % — ABNORMAL LOW (ref 36.0–46.0)
HCT: 26 % — ABNORMAL LOW (ref 36.0–46.0)
HCT: 24 % — ABNORMAL LOW (ref 36.0–46.0)
HCT: 25 % — ABNORMAL LOW (ref 36.0–46.0)
Hemoglobin: 8.2 g/dL — ABNORMAL LOW (ref 12.0–15.0)
Hemoglobin: 8.8 g/dL — ABNORMAL LOW (ref 12.0–15.0)
Hemoglobin: 8.2 g/dL — ABNORMAL LOW (ref 12.0–15.0)
Hemoglobin: 8.5 g/dL — ABNORMAL LOW (ref 12.0–15.0)
O2 Saturation: 100 %
O2 Saturation: 99 %
O2 Saturation: 97 %
O2 Saturation: 97 %
Patient temperature: 35.7
Patient temperature: 37.4
Patient temperature: 37.4
Potassium: 3.4 mmol/L — ABNORMAL LOW (ref 3.5–5.1)
Potassium: 3.8 mmol/L (ref 3.5–5.1)
Potassium: 4.7 mmol/L (ref 3.5–5.1)
Potassium: 4.6 mmol/L (ref 3.5–5.1)
Sodium: 143 mmol/L (ref 135–145)
Sodium: 143 mmol/L (ref 135–145)
Sodium: 142 mmol/L (ref 135–145)
Sodium: 142 mmol/L (ref 135–145)
TCO2: 27 mmol/L (ref 22–32)
TCO2: 24 mmol/L (ref 22–32)
TCO2: 24 mmol/L (ref 22–32)
TCO2: 24 mmol/L (ref 22–32)
pCO2 arterial: 41.3 mmHg (ref 32.0–48.0)
pCO2 arterial: 37 mmHg (ref 32.0–48.0)
pCO2 arterial: 46.1 mmHg (ref 32.0–48.0)
pCO2 arterial: 45.9 mmHg (ref 32.0–48.0)
pH, Arterial: 7.396 (ref 7.350–7.450)
pH, Arterial: 7.394 (ref 7.350–7.450)
pH, Arterial: 7.307 — ABNORMAL LOW (ref 7.350–7.450)
pH, Arterial: 7.312 — ABNORMAL LOW (ref 7.350–7.450)
pO2, Arterial: 316 mmHg — ABNORMAL HIGH (ref 83.0–108.0)
pO2, Arterial: 116 mmHg — ABNORMAL HIGH (ref 83.0–108.0)
pO2, Arterial: 103 mmHg (ref 83.0–108.0)
pO2, Arterial: 107 mmHg (ref 83.0–108.0)

## 2019-06-10 LAB — CBC
HCT: 33.7 % — ABNORMAL LOW (ref 36.0–46.0)
HCT: 27.6 % — ABNORMAL LOW (ref 36.0–46.0)
HCT: 27 % — ABNORMAL LOW (ref 36.0–46.0)
Hemoglobin: 10.9 g/dL — ABNORMAL LOW (ref 12.0–15.0)
Hemoglobin: 8.9 g/dL — ABNORMAL LOW (ref 12.0–15.0)
Hemoglobin: 8.5 g/dL — ABNORMAL LOW (ref 12.0–15.0)
MCH: 28.2 pg (ref 26.0–34.0)
MCH: 28.3 pg (ref 26.0–34.0)
MCH: 28 pg (ref 26.0–34.0)
MCHC: 32.3 g/dL (ref 30.0–36.0)
MCHC: 32.2 g/dL (ref 30.0–36.0)
MCHC: 31.5 g/dL (ref 30.0–36.0)
MCV: 87.1 fL (ref 80.0–100.0)
MCV: 87.6 fL (ref 80.0–100.0)
MCV: 88.8 fL (ref 80.0–100.0)
Platelets: 245 10*3/uL (ref 150–400)
Platelets: 129 10*3/uL — ABNORMAL LOW (ref 150–400)
Platelets: 150 10*3/uL (ref 150–400)
RBC: 3.87 MIL/uL (ref 3.87–5.11)
RBC: 3.15 MIL/uL — ABNORMAL LOW (ref 3.87–5.11)
RBC: 3.04 MIL/uL — ABNORMAL LOW (ref 3.87–5.11)
RDW: 12.8 % (ref 11.5–15.5)
RDW: 12.7 % (ref 11.5–15.5)
RDW: 12.8 % (ref 11.5–15.5)
WBC: 9.6 10*3/uL (ref 4.0–10.5)
WBC: 8.2 10*3/uL (ref 4.0–10.5)
WBC: 8.1 10*3/uL (ref 4.0–10.5)
nRBC: 0 % (ref 0.0–0.2)
nRBC: 0 % (ref 0.0–0.2)
nRBC: 0 % (ref 0.0–0.2)

## 2019-06-10 LAB — BASIC METABOLIC PANEL
Anion gap: 9 (ref 5–15)
BUN: 14 mg/dL (ref 8–23)
CO2: 24 mmol/L (ref 22–32)
Calcium: 8.6 mg/dL — ABNORMAL LOW (ref 8.9–10.3)
Chloride: 107 mmol/L (ref 98–111)
Creatinine, Ser: 1 mg/dL (ref 0.44–1.00)
GFR calc Af Amer: >60 mL/min (ref >60)
GFR calc non Af Amer: >60 mL/min (ref >60)
Glucose, Bld: 91 mg/dL (ref 70–99)
Potassium: 3.8 mmol/L (ref 3.5–5.1)
Sodium: 140 mmol/L (ref 135–145)

## 2019-06-10 LAB — POCT I-STAT 4, (NA,K, GLUC, HGB,HCT)
Glucose, Bld: 107 mg/dL — ABNORMAL HIGH (ref 70–99)
Glucose, Bld: 105 mg/dL — ABNORMAL HIGH (ref 70–99)
Glucose, Bld: 117 mg/dL — ABNORMAL HIGH (ref 70–99)
Glucose, Bld: 127 mg/dL — ABNORMAL HIGH (ref 70–99)
Glucose, Bld: 116 mg/dL — ABNORMAL HIGH (ref 70–99)
HCT: 32 % — ABNORMAL LOW (ref 36.0–46.0)
HCT: 28 % — ABNORMAL LOW (ref 36.0–46.0)
HCT: 23 % — ABNORMAL LOW (ref 36.0–46.0)
HCT: 24 % — ABNORMAL LOW (ref 36.0–46.0)
HCT: 25 % — ABNORMAL LOW (ref 36.0–46.0)
Hemoglobin: 10.9 g/dL — ABNORMAL LOW (ref 12.0–15.0)
Hemoglobin: 9.5 g/dL — ABNORMAL LOW (ref 12.0–15.0)
Hemoglobin: 7.8 g/dL — ABNORMAL LOW (ref 12.0–15.0)
Hemoglobin: 8.2 g/dL — ABNORMAL LOW (ref 12.0–15.0)
Hemoglobin: 8.5 g/dL — ABNORMAL LOW (ref 12.0–15.0)
Potassium: 3.4 mmol/L — ABNORMAL LOW (ref 3.5–5.1)
Potassium: 3.4 mmol/L — ABNORMAL LOW (ref 3.5–5.1)
Potassium: 4.1 mmol/L (ref 3.5–5.1)
Potassium: 3.7 mmol/L (ref 3.5–5.1)
Potassium: 3.8 mmol/L (ref 3.5–5.1)
Sodium: 140 mmol/L (ref 135–145)
Sodium: 139 mmol/L (ref 135–145)
Sodium: 138 mmol/L (ref 135–145)
Sodium: 140 mmol/L (ref 135–145)
Sodium: 141 mmol/L (ref 135–145)

## 2019-06-10 LAB — CREATININE, SERUM
Creatinine, Ser: 0.9 mg/dL (ref 0.44–1.00)
GFR calc Af Amer: >60 mL/min (ref >60)
GFR calc non Af Amer: >60 mL/min (ref >60)

## 2019-06-10 LAB — HEMOGLOBIN AND HEMATOCRIT, BLOOD
HCT: 24.6 % — ABNORMAL LOW (ref 36.0–46.0)
Hemoglobin: 7.8 g/dL — ABNORMAL LOW (ref 12.0–15.0)

## 2019-06-10 LAB — MRSA PCR SCREENING: MRSA by PCR: NEGATIVE

## 2019-06-10 LAB — POCT I-STAT, CHEM 8
BUN: 9 mg/dL (ref 8–23)
Calcium, Ion: 1.18 mmol/L (ref 1.15–1.40)
Chloride: 106 mmol/L (ref 98–111)
Creatinine, Ser: 0.8 mg/dL (ref 0.44–1.00)
Glucose, Bld: 104 mg/dL — ABNORMAL HIGH (ref 70–99)
HCT: 26 % — ABNORMAL LOW (ref 36.0–46.0)
Hemoglobin: 8.8 g/dL — ABNORMAL LOW (ref 12.0–15.0)
Potassium: 4.5 mmol/L (ref 3.5–5.1)
Sodium: 141 mmol/L (ref 135–145)
TCO2: 22 mmol/L (ref 22–32)

## 2019-06-10 LAB — PROTIME-INR
INR: 1.5 — ABNORMAL HIGH (ref 0.8–1.2)
Prothrombin Time: 17.6 seconds — ABNORMAL HIGH (ref 11.4–15.2)

## 2019-06-10 LAB — ECHO INTRAOPERATIVE TEE
Height: 60 in
Weight: 3132.8 oz

## 2019-06-10 LAB — PLATELET COUNT: Platelets: 169 10*3/uL (ref 150–400)

## 2019-06-10 LAB — MAGNESIUM: Magnesium: 3.2 mg/dL — ABNORMAL HIGH (ref 1.7–2.4)

## 2019-06-10 LAB — APTT: aPTT: 41 seconds — ABNORMAL HIGH (ref 24–36)

## 2019-06-10 SURGERY — CORONARY ARTERY BYPASS GRAFTING (CABG)
Anesthesia: General | Site: Chest

## 2019-06-10 MED ORDER — ALBUMIN HUMAN 5 % IV SOLN
INTRAVENOUS | Status: DC | PRN
  Administered 2019-06-10: 13:00:00 via INTRAVENOUS

## 2019-06-10 MED ORDER — ACETAMINOPHEN 500 MG PO TABS
1000.0000 mg | ORAL_TABLET | Freq: Four times a day (QID) | ORAL | Status: AC
  Administered 2019-06-10 – 2019-06-15 (×18): 1000 mg via ORAL
  Filled 2019-06-10 (×17): qty 2

## 2019-06-10 MED ORDER — LIDOCAINE 2% (20 MG/ML) 5 ML SYRINGE
INTRAMUSCULAR | Status: AC
  Filled 2019-06-10: qty 5

## 2019-06-10 MED ORDER — FENTANYL CITRATE (PF) 250 MCG/5ML IJ SOLN
INTRAMUSCULAR | Status: AC
  Filled 2019-06-10: qty 5

## 2019-06-10 MED ORDER — EPHEDRINE SULFATE-NACL 50-0.9 MG/10ML-% IV SOSY
PREFILLED_SYRINGE | INTRAVENOUS | Status: DC | PRN
  Administered 2019-06-10 (×4): 5 mg via INTRAVENOUS

## 2019-06-10 MED ORDER — ARTIFICIAL TEARS OPHTHALMIC OINT
TOPICAL_OINTMENT | OPHTHALMIC | Status: DC | PRN
  Administered 2019-06-10: 1 via OPHTHALMIC

## 2019-06-10 MED ORDER — PHENYLEPHRINE HCL-NACL 20-0.9 MG/250ML-% IV SOLN
0.0000 ug/min | INTRAVENOUS | Status: DC
  Administered 2019-06-10: 10 ug/min via INTRAVENOUS

## 2019-06-10 MED ORDER — METOPROLOL TARTRATE 12.5 MG HALF TABLET
12.5000 mg | ORAL_TABLET | Freq: Two times a day (BID) | ORAL | Status: DC
  Administered 2019-06-11 – 2019-06-12 (×3): 12.5 mg via ORAL
  Filled 2019-06-10 (×3): qty 1

## 2019-06-10 MED ORDER — NITROGLYCERIN IN D5W 200-5 MCG/ML-% IV SOLN
0.0000 ug/min | INTRAVENOUS | Status: DC
  Administered 2019-06-11: 5 ug/min via INTRAVENOUS

## 2019-06-10 MED ORDER — LACTATED RINGERS IV SOLN
INTRAVENOUS | Status: DC
  Administered 2019-06-10 – 2019-06-12 (×2): via INTRAVENOUS

## 2019-06-10 MED ORDER — INSULIN REGULAR BOLUS VIA INFUSION
0.0000 [IU] | Freq: Three times a day (TID) | INTRAVENOUS | Status: DC
  Filled 2019-06-10: qty 10

## 2019-06-10 MED ORDER — OXYCODONE HCL 5 MG PO TABS
5.0000 mg | ORAL_TABLET | ORAL | Status: DC | PRN
  Administered 2019-06-11: 5 mg via ORAL
  Administered 2019-06-11: 10 mg via ORAL
  Administered 2019-06-11 – 2019-06-12 (×2): 5 mg via ORAL
  Filled 2019-06-10 (×3): qty 1
  Filled 2019-06-10: qty 2

## 2019-06-10 MED ORDER — PHENYLEPHRINE 40 MCG/ML (10ML) SYRINGE FOR IV PUSH (FOR BLOOD PRESSURE SUPPORT)
PREFILLED_SYRINGE | INTRAVENOUS | Status: AC
  Filled 2019-06-10: qty 10

## 2019-06-10 MED ORDER — ROCURONIUM BROMIDE 10 MG/ML (PF) SYRINGE
PREFILLED_SYRINGE | INTRAVENOUS | Status: AC
  Filled 2019-06-10: qty 10

## 2019-06-10 MED ORDER — MORPHINE SULFATE (PF) 2 MG/ML IV SOLN
1.0000 mg | INTRAVENOUS | Status: DC | PRN
  Administered 2019-06-10 – 2019-06-11 (×3): 2 mg via INTRAVENOUS
  Filled 2019-06-10 (×3): qty 1

## 2019-06-10 MED ORDER — SODIUM CHLORIDE 0.9% FLUSH: 3.0000 mL | INTRAVENOUS | Status: DC | PRN

## 2019-06-10 MED ORDER — MIDAZOLAM HCL 5 MG/5ML IJ SOLN
INTRAMUSCULAR | Status: DC | PRN
  Administered 2019-06-10: 3 mg via INTRAVENOUS

## 2019-06-10 MED ORDER — HEMOSTATIC AGENTS (NO CHARGE) OPTIME
TOPICAL | Status: DC | PRN
  Administered 2019-06-10: 1 via TOPICAL

## 2019-06-10 MED ORDER — ROCURONIUM BROMIDE 10 MG/ML (PF) SYRINGE
PREFILLED_SYRINGE | INTRAVENOUS | Status: DC | PRN
  Administered 2019-06-10 (×4): 50 mg via INTRAVENOUS

## 2019-06-10 MED ORDER — PHENYLEPHRINE 40 MCG/ML (10ML) SYRINGE FOR IV PUSH (FOR BLOOD PRESSURE SUPPORT)
PREFILLED_SYRINGE | INTRAVENOUS | Status: DC | PRN
  Administered 2019-06-10: 80 ug via INTRAVENOUS

## 2019-06-10 MED ORDER — LACTATED RINGERS IV SOLN
INTRAVENOUS | Status: DC | PRN
  Administered 2019-06-10: 08:00:00 via INTRAVENOUS

## 2019-06-10 MED ORDER — SODIUM CHLORIDE 0.9 % IV SOLN
INTRAVENOUS | Status: DC
  Administered 2019-06-10: 15:00:00 via INTRAVENOUS

## 2019-06-10 MED ORDER — ACETAMINOPHEN 650 MG RE SUPP
650.0000 mg | Freq: Once | RECTAL | Status: AC
  Administered 2019-06-10: 650 mg via RECTAL

## 2019-06-10 MED ORDER — INDOCYANINE GREEN 25 MG IV SOLR
INTRAVENOUS | Status: DC | PRN
  Administered 2019-06-10: .625 mg via INTRAVENOUS

## 2019-06-10 MED ORDER — 0.9 % SODIUM CHLORIDE (POUR BTL) OPTIME
TOPICAL | Status: DC | PRN
  Administered 2019-06-10: 5000 mL

## 2019-06-10 MED ORDER — PROPOFOL 10 MG/ML IV BOLUS
INTRAVENOUS | Status: DC | PRN
  Administered 2019-06-10: 140 mg via INTRAVENOUS
  Administered 2019-06-10: 30 mg via INTRAVENOUS

## 2019-06-10 MED ORDER — ALBUMIN HUMAN 5 % IV SOLN
250.0000 mL | INTRAVENOUS | Status: AC | PRN
  Administered 2019-06-10 (×3): 12.5 g via INTRAVENOUS
  Filled 2019-06-10: qty 500

## 2019-06-10 MED ORDER — VANCOMYCIN HCL 1000 MG IV SOLR
INTRAVENOUS | Status: DC | PRN
  Administered 2019-06-10: 1000 mL

## 2019-06-10 MED ORDER — ASPIRIN 81 MG PO CHEW: 324.0000 mg | CHEWABLE_TABLET | Freq: Every day | ORAL | Status: DC

## 2019-06-10 MED ORDER — BISACODYL 10 MG RE SUPP
10.0000 mg | Freq: Every day | RECTAL | Status: DC
  Filled 2019-06-10: qty 1

## 2019-06-10 MED ORDER — SODIUM CHLORIDE 0.9% FLUSH
3.0000 mL | Freq: Two times a day (BID) | INTRAVENOUS | Status: DC
  Administered 2019-06-11 – 2019-06-16 (×10): 3 mL via INTRAVENOUS

## 2019-06-10 MED ORDER — FAMOTIDINE IN NACL 20-0.9 MG/50ML-% IV SOLN
20.0000 mg | Freq: Two times a day (BID) | INTRAVENOUS | Status: AC
  Administered 2019-06-10 – 2019-06-11 (×2): 20 mg via INTRAVENOUS
  Filled 2019-06-10: qty 50

## 2019-06-10 MED ORDER — ONDANSETRON HCL 4 MG/2ML IJ SOLN
4.0000 mg | Freq: Four times a day (QID) | INTRAMUSCULAR | Status: DC | PRN
  Administered 2019-06-12: 4 mg via INTRAVENOUS
  Filled 2019-06-10: qty 2

## 2019-06-10 MED ORDER — ASPIRIN EC 325 MG PO TBEC
325.0000 mg | DELAYED_RELEASE_TABLET | Freq: Every day | ORAL | Status: DC
  Administered 2019-06-11 – 2019-06-12 (×2): 325 mg via ORAL
  Filled 2019-06-10 (×2): qty 1

## 2019-06-10 MED ORDER — HEPARIN SODIUM (PORCINE) 1000 UNIT/ML IJ SOLN
INTRAMUSCULAR | Status: DC | PRN
  Administered 2019-06-10: 28000 [IU] via INTRAVENOUS

## 2019-06-10 MED ORDER — SODIUM CHLORIDE 0.9 % IV SOLN: 250.0000 mL | INTRAVENOUS | Status: DC

## 2019-06-10 MED ORDER — METOPROLOL TARTRATE 5 MG/5ML IV SOLN
2.5000 mg | INTRAVENOUS | Status: DC | PRN
  Filled 2019-06-10 (×2): qty 5

## 2019-06-10 MED ORDER — LACTATED RINGERS IV SOLN
INTRAVENOUS | Status: DC | PRN
  Administered 2019-06-10 (×2): via INTRAVENOUS

## 2019-06-10 MED ORDER — FENTANYL CITRATE (PF) 250 MCG/5ML IJ SOLN
INTRAMUSCULAR | Status: AC
  Filled 2019-06-10: qty 25

## 2019-06-10 MED ORDER — PROPOFOL 10 MG/ML IV BOLUS
INTRAVENOUS | Status: AC
  Filled 2019-06-10: qty 20

## 2019-06-10 MED ORDER — SODIUM CHLORIDE (PF) 0.9 % IJ SOLN
INTRAMUSCULAR | Status: AC
  Filled 2019-06-10: qty 20

## 2019-06-10 MED ORDER — ONDANSETRON HCL 4 MG/2ML IJ SOLN
INTRAMUSCULAR | Status: DC | PRN
  Administered 2019-06-10: 4 mg via INTRAVENOUS

## 2019-06-10 MED ORDER — LACTATED RINGERS IV SOLN
INTRAVENOUS | Status: DC
  Administered 2019-06-10: 14:00:00 via INTRAVENOUS

## 2019-06-10 MED ORDER — ONDANSETRON HCL 4 MG/2ML IJ SOLN
INTRAMUSCULAR | Status: AC
  Filled 2019-06-10: qty 2

## 2019-06-10 MED ORDER — POTASSIUM CHLORIDE 10 MEQ/50ML IV SOLN
10.0000 meq | INTRAVENOUS | Status: AC
  Administered 2019-06-10 (×3): 10 meq via INTRAVENOUS

## 2019-06-10 MED ORDER — STERILE WATER FOR INJECTION IJ SOLN
INTRAMUSCULAR | Status: AC
  Filled 2019-06-10: qty 10

## 2019-06-10 MED ORDER — ACETAMINOPHEN 160 MG/5ML PO SOLN: 1000.0000 mg | Freq: Four times a day (QID) | ORAL | Status: AC

## 2019-06-10 MED ORDER — EPHEDRINE 5 MG/ML INJ
INTRAVENOUS | Status: AC
  Filled 2019-06-10: qty 10

## 2019-06-10 MED ORDER — SODIUM CHLORIDE (PF) 0.9 % IJ SOLN
OROMUCOSAL | Status: DC | PRN
  Administered 2019-06-10 (×3): 4 mL via TOPICAL

## 2019-06-10 MED ORDER — VANCOMYCIN HCL IN DEXTROSE 1-5 GM/200ML-% IV SOLN
1000.0000 mg | Freq: Once | INTRAVENOUS | Status: AC
  Administered 2019-06-10: 21:00:00 1000 mg via INTRAVENOUS
  Filled 2019-06-10: qty 200

## 2019-06-10 MED ORDER — TRAMADOL HCL 50 MG PO TABS
50.0000 mg | ORAL_TABLET | ORAL | Status: DC | PRN
  Administered 2019-06-11 – 2019-06-16 (×5): 100 mg via ORAL
  Filled 2019-06-10 (×6): qty 2

## 2019-06-10 MED ORDER — LACTATED RINGERS IV SOLN: 500.0000 mL | Freq: Once | INTRAVENOUS | Status: DC | PRN

## 2019-06-10 MED ORDER — INSULIN REGULAR(HUMAN) IN NACL 100-0.9 UT/100ML-% IV SOLN: INTRAVENOUS | Status: DC

## 2019-06-10 MED ORDER — HEPARIN SODIUM (PORCINE) 1000 UNIT/ML IJ SOLN
INTRAMUSCULAR | Status: AC
  Filled 2019-06-10: qty 1

## 2019-06-10 MED ORDER — MIDAZOLAM HCL (PF) 10 MG/2ML IJ SOLN
INTRAMUSCULAR | Status: AC
  Filled 2019-06-10: qty 2

## 2019-06-10 MED ORDER — DEXAMETHASONE SODIUM PHOSPHATE 10 MG/ML IJ SOLN
INTRAMUSCULAR | Status: AC
  Filled 2019-06-10: qty 1

## 2019-06-10 MED ORDER — MIDAZOLAM HCL 2 MG/2ML IJ SOLN: 2.0000 mg | INTRAMUSCULAR | Status: DC | PRN

## 2019-06-10 MED ORDER — METOPROLOL TARTRATE 25 MG/10 ML ORAL SUSPENSION: 12.5000 mg | Freq: Two times a day (BID) | ORAL | Status: DC

## 2019-06-10 MED ORDER — PROTAMINE SULFATE 10 MG/ML IV SOLN
INTRAVENOUS | Status: DC | PRN
  Administered 2019-06-10: 60 mg via INTRAVENOUS
  Administered 2019-06-10: 30 mg via INTRAVENOUS
  Administered 2019-06-10 (×2): 50 mg via INTRAVENOUS
  Administered 2019-06-10: 40 mg via INTRAVENOUS
  Administered 2019-06-10: 50 mg via INTRAVENOUS

## 2019-06-10 MED ORDER — PLASMA-LYTE 148 IV SOLN
INTRAVENOUS | Status: DC | PRN
  Administered 2019-06-10: 500 mL via INTRAVASCULAR

## 2019-06-10 MED ORDER — DEXMEDETOMIDINE HCL IN NACL 200 MCG/50ML IV SOLN: 0.0000 ug/kg/h | INTRAVENOUS | Status: DC

## 2019-06-10 MED ORDER — CALCIUM CHLORIDE 10 % IV SOLN
INTRAVENOUS | Status: AC
  Filled 2019-06-10: qty 10

## 2019-06-10 MED ORDER — FENTANYL CITRATE (PF) 250 MCG/5ML IJ SOLN
INTRAMUSCULAR | Status: DC | PRN
  Administered 2019-06-10: 200 ug via INTRAVENOUS
  Administered 2019-06-10: 100 ug via INTRAVENOUS
  Administered 2019-06-10: 50 ug via INTRAVENOUS
  Administered 2019-06-10 (×2): 100 ug via INTRAVENOUS
  Administered 2019-06-10: 250 ug via INTRAVENOUS
  Administered 2019-06-10: 150 ug via INTRAVENOUS
  Administered 2019-06-10 (×3): 100 ug via INTRAVENOUS
  Administered 2019-06-10: 250 ug via INTRAVENOUS

## 2019-06-10 MED ORDER — SODIUM CHLORIDE 0.45 % IV SOLN
INTRAVENOUS | Status: DC | PRN
  Administered 2019-06-10: 14:00:00 via INTRAVENOUS

## 2019-06-10 MED ORDER — PANTOPRAZOLE SODIUM 40 MG PO TBEC
40.0000 mg | DELAYED_RELEASE_TABLET | Freq: Every day | ORAL | Status: DC
  Administered 2019-06-12 – 2019-06-16 (×5): 40 mg via ORAL
  Filled 2019-06-10 (×5): qty 1

## 2019-06-10 MED ORDER — BISACODYL 5 MG PO TBEC
10.0000 mg | DELAYED_RELEASE_TABLET | Freq: Every day | ORAL | Status: DC
  Administered 2019-06-11 – 2019-06-15 (×5): 10 mg via ORAL
  Filled 2019-06-10 (×6): qty 2

## 2019-06-10 MED ORDER — ACETAMINOPHEN 160 MG/5ML PO SOLN: 650.0000 mg | Freq: Once | ORAL | Status: AC

## 2019-06-10 MED ORDER — MAGNESIUM SULFATE 4 GM/100ML IV SOLN
4.0000 g | Freq: Once | INTRAVENOUS | Status: AC
  Administered 2019-06-10: 4 g via INTRAVENOUS
  Filled 2019-06-10: qty 100

## 2019-06-10 MED ORDER — SODIUM CHLORIDE 0.9 % IV SOLN
1.5000 g | Freq: Two times a day (BID) | INTRAVENOUS | Status: AC
  Administered 2019-06-10 – 2019-06-12 (×4): 1.5 g via INTRAVENOUS
  Filled 2019-06-10 (×4): qty 1.5

## 2019-06-10 MED ORDER — DOCUSATE SODIUM 100 MG PO CAPS
200.0000 mg | ORAL_CAPSULE | Freq: Every day | ORAL | Status: DC
  Administered 2019-06-11 – 2019-06-16 (×6): 200 mg via ORAL
  Filled 2019-06-10 (×6): qty 2

## 2019-06-10 MED ORDER — CHLORHEXIDINE GLUCONATE 0.12 % MT SOLN
15.0000 mL | OROMUCOSAL | Status: AC
  Administered 2019-06-10: 15 mL via OROMUCOSAL

## 2019-06-10 SURGICAL SUPPLY — 87 items
ADAPTER CARDIO PERF ANTE/RETRO (ADAPTER) ×6 IMPLANT
BAG DECANTER FOR FLEXI CONT (MISCELLANEOUS) ×3 IMPLANT
BANDAGE ELASTIC 4 VELCRO ST LF (GAUZE/BANDAGES/DRESSINGS) ×3 IMPLANT
BANDAGE ELASTIC 6 VELCRO ST LF (GAUZE/BANDAGES/DRESSINGS) ×3 IMPLANT
BASKET HEART (ORDER IN 25'S) (MISCELLANEOUS) ×1
BASKET HEART (ORDER IN 25S) (MISCELLANEOUS) ×2 IMPLANT
BLADE CLIPPER SURG (BLADE) IMPLANT
BLADE STERNUM SYSTEM 6 (BLADE) ×3 IMPLANT
BNDG ELASTIC 4X5.8 VLCR STR LF (GAUZE/BANDAGES/DRESSINGS) ×3 IMPLANT
BNDG ELASTIC 6X5.8 VLCR STR LF (GAUZE/BANDAGES/DRESSINGS) ×3 IMPLANT
BNDG GAUZE ELAST 4 BULKY (GAUZE/BANDAGES/DRESSINGS) ×3 IMPLANT
CANISTER SUCT 3000ML PPV (MISCELLANEOUS) ×3 IMPLANT
CATH CPB KIT HENDRICKSON (MISCELLANEOUS) ×3 IMPLANT
CATH RETROPLEGIA CORONARY 14FR (CATHETERS) ×3 IMPLANT
CATH ROBINSON RED A/P 18FR (CATHETERS) ×9 IMPLANT
CLIP RETRACTION 3.0MM CORONARY (MISCELLANEOUS) ×3 IMPLANT
CONN ST 1/4X3/8  BEN (MISCELLANEOUS) ×3
CONN ST 1/4X3/8 BEN (MISCELLANEOUS) ×6 IMPLANT
COVER WAND RF STERILE (DRAPES) ×3 IMPLANT
DERMABOND ADVANCED (GAUZE/BANDAGES/DRESSINGS) ×1
DERMABOND ADVANCED .7 DNX12 (GAUZE/BANDAGES/DRESSINGS) ×2 IMPLANT
DRAIN CHANNEL 28F RND 3/8 FF (WOUND CARE) ×9 IMPLANT
DRAPE CARDIOVASCULAR INCISE (DRAPES) ×1
DRAPE SLUSH/WARMER DISC (DRAPES) ×3 IMPLANT
DRAPE SRG 135X102X78XABS (DRAPES) ×2 IMPLANT
DRSG AQUACEL AG ADV 3.5X14 (GAUZE/BANDAGES/DRESSINGS) ×3 IMPLANT
ELECT BLADE 6.5 EXT (BLADE) ×3 IMPLANT
ELECT CAUTERY BLADE 6.4 (BLADE) ×3 IMPLANT
ELECT REM PT RETURN 9FT ADLT (ELECTROSURGICAL) ×6
ELECTRODE REM PT RTRN 9FT ADLT (ELECTROSURGICAL) ×4 IMPLANT
FELT TEFLON 1X6 (MISCELLANEOUS) ×6 IMPLANT
GAUZE SPONGE 4X4 12PLY STRL (GAUZE/BANDAGES/DRESSINGS) ×3 IMPLANT
GAUZE SPONGE 4X4 12PLY STRL LF (GAUZE/BANDAGES/DRESSINGS) ×3 IMPLANT
GLOVE BIO SURGEON STRL SZ 6.5 (GLOVE) ×12 IMPLANT
GLOVE BIO SURGEON STRL SZ7.5 (GLOVE) ×6 IMPLANT
GLOVE BIOGEL PI IND STRL 6 (GLOVE) ×2 IMPLANT
GLOVE BIOGEL PI INDICATOR 6 (GLOVE) ×1
GLOVE NEODERM STRL 7.5 LF PF (GLOVE) ×6 IMPLANT
GLOVE SURG NEODERM 7.5  LF PF (GLOVE) ×3
GOWN STRL REUS W/ TWL LRG LVL3 (GOWN DISPOSABLE) ×20 IMPLANT
GOWN STRL REUS W/TWL LRG LVL3 (GOWN DISPOSABLE) ×10
HEMOSTAT POWDER SURGIFOAM 1G (HEMOSTASIS) ×9 IMPLANT
HEMOSTAT SURGICEL 2X14 (HEMOSTASIS) ×3 IMPLANT
KIT BASIN OR (CUSTOM PROCEDURE TRAY) ×3 IMPLANT
KIT SUCTION CATH 14FR (SUCTIONS) ×3 IMPLANT
KIT TURNOVER KIT B (KITS) ×3 IMPLANT
KIT VASOVIEW HEMOPRO 2 VH 4000 (KITS) ×3 IMPLANT
LEAD PACING MYOCARDI (MISCELLANEOUS) ×3 IMPLANT
MARKER GRAFT CORONARY BYPASS (MISCELLANEOUS) ×9 IMPLANT
NS IRRIG 1000ML POUR BTL (IV SOLUTION) ×15 IMPLANT
PACK E OPEN HEART (SUTURE) ×3 IMPLANT
PACK OPEN HEART (CUSTOM PROCEDURE TRAY) ×3 IMPLANT
PAD ARMBOARD 7.5X6 YLW CONV (MISCELLANEOUS) ×6 IMPLANT
PAD ELECT DEFIB RADIOL ZOLL (MISCELLANEOUS) ×3 IMPLANT
PENCIL BUTTON HOLSTER BLD 10FT (ELECTRODE) ×3 IMPLANT
POSITIONER HEAD DONUT 9IN (MISCELLANEOUS) ×3 IMPLANT
PUNCH AORTIC ROT 4.0MM RCL 40 (MISCELLANEOUS) ×3 IMPLANT
PUNCH AORTIC ROTATE  4.5MM 8IN (MISCELLANEOUS) ×3 IMPLANT
SEALANT SURG COSEAL 8ML (VASCULAR PRODUCTS) ×3 IMPLANT
SET CARDIOPLEGIA MPS 5001102 (MISCELLANEOUS) ×3 IMPLANT
SUT BONE WAX W31G (SUTURE) ×3 IMPLANT
SUT MNCRL AB 3-0 PS2 18 (SUTURE) ×6 IMPLANT
SUT PDS AB 1 CTX 36 (SUTURE) ×6 IMPLANT
SUT PROLENE 3 0 SH DA (SUTURE) ×3 IMPLANT
SUT PROLENE 4 0 SH DA (SUTURE) ×3 IMPLANT
SUT PROLENE 5 0 C 1 36 (SUTURE) IMPLANT
SUT PROLENE 6 0 C 1 30 (SUTURE) ×9 IMPLANT
SUT PROLENE 7 0 BV 1 (SUTURE) ×3 IMPLANT
SUT PROLENE 8 0 BV175 6 (SUTURE) IMPLANT
SUT PROLENE BLUE 7 0 (SUTURE) ×3 IMPLANT
SUT SILK 2 0 SH CR/8 (SUTURE) IMPLANT
SUT SILK 3 0 SH CR/8 (SUTURE) IMPLANT
SUT STEEL 6MS V (SUTURE) ×3 IMPLANT
SUT STEEL SZ 6 DBL 3X14 BALL (SUTURE) ×3 IMPLANT
SUT VIC AB 2-0 CT1 27 (SUTURE) ×1
SUT VIC AB 2-0 CT1 TAPERPNT 27 (SUTURE) ×2 IMPLANT
SUT VIC AB 2-0 CTX 27 (SUTURE) IMPLANT
SUT VIC AB 3-0 X1 27 (SUTURE) ×3 IMPLANT
SYSTEM SAHARA CHEST DRAIN ATS (WOUND CARE) ×3 IMPLANT
TAPE CLOTH SURG 4X10 WHT LF (GAUZE/BANDAGES/DRESSINGS) ×3 IMPLANT
TAPE PAPER 2X10 WHT MICROPORE (GAUZE/BANDAGES/DRESSINGS) ×3 IMPLANT
TOWEL GREEN STERILE (TOWEL DISPOSABLE) ×3 IMPLANT
TOWEL GREEN STERILE FF (TOWEL DISPOSABLE) ×3 IMPLANT
TRAY FOLEY SLVR 16FR TEMP STAT (SET/KITS/TRAYS/PACK) ×3 IMPLANT
TUBING LAP HI FLOW INSUFFLATIO (TUBING) ×3 IMPLANT
UNDERPAD 30X30 (UNDERPADS AND DIAPERS) ×3 IMPLANT
WATER STERILE IRR 1000ML POUR (IV SOLUTION) ×6 IMPLANT

## 2019-06-10 NOTE — Anesthesia Procedure Notes (Signed)
Central Venous Catheter Insertion Performed by: Effie Berkshire, MD, anesthesiologist Start/End8/03/2019 8:00 AM, 06/10/2019 8:10 AM Patient location: Pre-op. Preanesthetic checklist: patient identified, IV checked, site marked, risks and benefits discussed, surgical consent, monitors and equipment checked, pre-op evaluation, timeout performed and anesthesia consent Position: Trendelenburg Lidocaine 1% used for infiltration and patient sedated Hand hygiene performed , maximum sterile barriers used  and Seldinger technique used Catheter size: 8.5 Fr Total catheter length 10. Central line was placed.Sheath introducer Swan type:thermodilution PA Cath depth:50 Procedure performed using ultrasound guided technique. Ultrasound Notes:anatomy identified, needle tip was noted to be adjacent to the nerve/plexus identified, no ultrasound evidence of intravascular and/or intraneural injection and image(s) printed for medical record Attempts: 1 Following insertion, line sutured and dressing applied. Post procedure assessment: blood return through all ports, free fluid flow and no air  Patient tolerated the procedure well with no immediate complications.

## 2019-06-10 NOTE — Anesthesia Procedure Notes (Signed)
Central Venous Catheter Insertion Performed by: Effie Berkshire, MD, anesthesiologist Start/End8/03/2019 8:10 AM, 06/10/2019 8:15 AM Patient location: Pre-op. Preanesthetic checklist: patient identified, IV checked, site marked, risks and benefits discussed, surgical consent, monitors and equipment checked, pre-op evaluation, timeout performed and anesthesia consent Hand hygiene performed  and maximum sterile barriers used  PA cath was placed.Swan type:thermodilution Procedure performed without using ultrasound guided technique. Attempts: 1 Patient tolerated the procedure well with no immediate complications.

## 2019-06-10 NOTE — Progress Notes (Signed)
Chart reviewed. Patient underwent CABG today and is transferred to TCTS services. No continued medical management needs observed. Will sign off. Please consult [facility] as needed.  Cordelia Poche, MD [facility] 06/10/2019, 2:31 PM

## 2019-06-10 NOTE — Progress Notes (Signed)
The radiologist called to inform RT of patient's ETT being in the R mainstem bronchus and that it needed to be pulled back 4 cm. The patient's ETT is currently at 21cm and was retracted 4 cm from 25 cm. Will continue to monitor the patient.

## 2019-06-10 NOTE — Transfer of Care (Signed)
Immediate Anesthesia Transfer of Care Note  Patient: Kimberly Bryant  Procedure(s) Performed: CORONARY ARTERY BYPASS GRAFTING (CABG) x Four, using left internal mammary artery and right leg greater saphenous vein harvested endoscopically (N/A Chest) TRANSESOPHAGEAL ECHOCARDIOGRAM (TEE) (N/A )  Patient Location: ICU  Anesthesia Type:General  Level of Consciousness: Patient remains intubated per anesthesia plan  Airway & Oxygen Therapy: Patient remains intubated per anesthesia plan and Patient placed on Ventilator (see vital sign flow sheet for setting)  Post-op Assessment: Report given to RN and Post -op Vital signs reviewed and stable  Post vital signs: Reviewed and stable  Last Vitals:  Vitals Value Taken Time  BP 111/66 06/10/19 1351  Temp 35.6 C 06/10/19 1356  Pulse 78 06/10/19 1356  Resp 12 06/10/19 1356  SpO2 100 % 06/10/19 1356  Vitals shown include unvalidated device data.  Last Pain:  Vitals:   06/10/19 0548  TempSrc: Oral  PainSc:      Report to [facility] RN in [facility] ICU. Patient transported in stable condition, Atkins MD present for transport. Patient remained intubated, applied to ventilator via RT at bedside, SIMV mode, 50% FiO2, 400Vt, RR 12, 5 PEEP, bilateral breath sounds present; confirmed ventilation. Patient currently on precedex and insulin infusions. All questions answered. Pacer currently on OOO mode noting patient's underlying intrinsic rhythm sufficient for CO.      Complications: No apparent anesthesia complications

## 2019-06-10 NOTE — Progress Notes (Signed)
  Echocardiogram Echocardiogram Transesophageal has been performed.  Kimberly Bryant M 06/10/2019, 9:23 AM

## 2019-06-10 NOTE — Anesthesia Procedure Notes (Signed)
Arterial Line Insertion Start/End8/03/2019 8:10 AM, 06/10/2019 8:15 AM Performed by: Jearld Pies, CRNA, CRNA  Patient location: Pre-op. Preanesthetic checklist: patient identified, IV checked, site marked, risks and benefits discussed, surgical consent, monitors and equipment checked, pre-op evaluation, timeout performed and anesthesia consent Patient sedated Left, radial was placed Catheter size: 20 G Hand hygiene performed , maximum sterile barriers used  and Seldinger technique used Allen's test indicative of satisfactory collateral circulation Attempts: 2 Procedure performed using ultrasound guided technique. Ultrasound Notes:anatomy identified, needle tip was noted to be adjacent to the nerve/plexus identified and no ultrasound evidence of intravascular and/or intraneural injection Following insertion, dressing applied and Biopatch. Post procedure assessment: normal  Patient tolerated the procedure well with no immediate complications.

## 2019-06-10 NOTE — Procedures (Signed)
Extubation Procedure Note  Patient Details:   Name: Kimberly Bryant DOB: [birth year] MRN: [id number]   Airway Documentation:    Vent end date: 06/10/19 Vent end time: 1835   Evaluation  O2 sats: stable throughout Complications: No apparent complications Patient did tolerate procedure well. Bilateral Breath Sounds: Clear   Patient extubated to 2L [place] with the use of video interpreter. Patient met all weaning parameters prior to extubation. Patient had positive cuff leak prior to ext & able to cough & speak post ext  [name] 06/10/2019, 6:41 PM

## 2019-06-10 NOTE — Brief Op Note (Signed)
06/08/2019 - 06/10/2019  1:32 PM  PATIENT:  Kimberly Bryant  63 y.o. female  PRE-OPERATIVE DIAGNOSIS:  Coronary ARtery Disease  POST-OPERATIVE DIAGNOSIS:  Coronary ARtery Disease  PROCEDURE:  Procedure(s): CORONARY ARTERY BYPASS GRAFTING (CABG) x Four, using left internal mammary artery and right leg greater saphenous vein harvested endoscopically (N/A) TRANSESOPHAGEAL ECHOCARDIOGRAM (TEE) (N/A); completion graft interrogation with indocyanine green fluorescence imaging (SPY)  SURGEON:  Surgeon(s) and Role:    * Wonda Olds, MD - Primary  PHYSICIAN ASSISTANT: Jadene Pierini PA-C  ASSISTANTS: staff   ANESTHESIA:   general  EBL:  1000 mL   BLOOD ADMINISTERED:450 CC CELLSAVER  DRAINS: 3 Chest Tube(s) in the mediastinum and left pleural space   LOCAL MEDICATIONS USED:  NONE  SPECIMEN:  No Specimen  DISPOSITION OF SPECIMEN:  N/A  COUNTS:  YES  TOURNIQUET:  * No tourniquets in log *  DICTATION: .Note written in EPIC  PLAN OF CARE: Admit to inpatient   PATIENT DISPOSITION:  ICU - intubated and hemodynamically stable.   Delay start of Pharmacological VTE agent (>24hrs) due to surgical blood loss or risk of bleeding: yes

## 2019-06-10 NOTE — Anesthesia Procedure Notes (Signed)
Procedure Name: Intubation Date/Time: 06/10/2019 8:53 AM Performed by: Jearld Pies, CRNA Pre-anesthesia Checklist: Patient identified, Emergency Drugs available, Suction available and Patient being monitored Patient Re-evaluated:Patient Re-evaluated prior to induction Oxygen Delivery Method: Circle System Utilized Preoxygenation: Pre-oxygenation with 100% oxygen Induction Type: IV induction Ventilation: Mask ventilation without difficulty and Oral airway inserted - appropriate to patient size Laryngoscope Size: Mac and 3 Grade View: Grade I Tube type: Oral Tube size: 7.0 mm Number of attempts: 1 Airway Equipment and Method: Stylet and Oral airway Placement Confirmation: ETT inserted through vocal cords under direct vision,  positive ETCO2 and breath sounds checked- equal and bilateral Secured at: 22 cm Tube secured with: Tape Dental Injury: Teeth and Oropharynx as per pre-operative assessment

## 2019-06-10 NOTE — Brief Op Note (Signed)
06/08/2019 - 06/10/2019  4:37 PM  PATIENT:  Kimberly Bryant  63 y.o. female  PRE-OPERATIVE DIAGNOSIS:  Coronary ARtery Disease  POST-OPERATIVE DIAGNOSIS:  Coronary ARtery Disease  PROCEDURE:  Procedure(s): CORONARY ARTERY BYPASS GRAFTING (CABG) x Four, using left internal mammary artery and right leg greater saphenous vein harvested endoscopically (N/A) TRANSESOPHAGEAL ECHOCARDIOGRAM (TEE) (N/A) LIMA-LAD SVG-DISTAL LAD SVG-OM SVG-DIAG  SURGEON:  Surgeon(s) and Role:    * Atkins, Glenice Bow, MD - Primary  PHYSICIAN ASSISTANT: WAYNE GOLD PA-C   ANESTHESIA:   general  EBL:  1000 mL   BLOOD ADMINISTERED:none  DRAINS: PLEURAL AND PERICARDIAL CHEST TUBES   LOCAL MEDICATIONS USED:  NONE  SPECIMEN:  No Specimen  DISPOSITION OF SPECIMEN:  N/A  COUNTS:  YES  TOURNIQUET:  * No tourniquets in log *  DICTATION: .Dragon Dictation  PLAN OF CARE: Admit to inpatient   PATIENT DISPOSITION:  ICU - intubated and hemodynamically stable.   Delay start of Pharmacological VTE agent (>24hrs) due to surgical blood loss or risk of bleeding: yes  COMPLICATIONS: NO KNOWN

## 2019-06-10 NOTE — Op Note (Signed)
CARDIOTHORACIC SURGERY OPERATIVE NOTE  Date of Procedure: 06/10/2019  Preoperative Diagnosis: Severe 3-vessel Coronary Artery Disease, s/p NSTEMI  Postoperative Diagnosis: Same  Procedure:    Coronary Artery Bypass Grafting x 4   Left Internal Mammary Artery to Distal Left Anterior Descending Coronary Artery; Saphenous Vein Graft to  Obtuse Marginal Branch of Left Circumflex Coronary Artery, Sapheonous Vein Graft to  Diagonal Branch Coronary Artery; Saphenous vein graft to apical portion of the LAD; Endoscopic Vein Harvest from right Thigh and Lower Leg; completion interrogation of coronary grafts using indocyanine green fluorescence imaging (SPY)  Surgeon: Wonda Olds, MD  Assistant: Jadene Pierini PA-C  Anesthesia: get  Operative Findings:  good left ventricular systolic function  good quality left internal mammary artery conduit  good quality saphenous vein conduit  goodquality target vessels for grafting (PDA was deemed to small and diffusely diseased for grafting)    BRIEF CLINICAL NOTE AND INDICATIONS FOR SURGERY 63 yo lady with PMHx for HTN presented with sudden onset chest pain 2 days ago. Workup revealed NSTEMI. LHC showed multivessel CAD which was severe in all 3 coronaries and several important branches. She is taken to the OR for CABG surgery.   DETAILS OF THE OPERATIVE PROCEDURE  Preparation:  The patient is brought to the operating room on the above mentioned date and central monitoring was established by the anesthesia team including placement of Swan-Ganz catheter and radial arterial line. The patient is placed in the supine position on the operating table.  Intravenous antibiotics are administered. General endotracheal anesthesia is induced uneventfully. A Foley catheter is placed.  Baseline transesophageal echocardiogram was performed.  Findings were notable for thickened LV and mild-moderate MR.  The patient's chest, abdomen, both groins, and both lower  extremities are prepared and draped in a sterile manner. A time out procedure is performed.   Surgical Approach and Conduit Harvest:  A median sternotomy incision was performed and the left internal mammary artery is dissected from the chest wall and prepared for bypass grafting. The left internal mammary artery is notably good quality conduit. Simultaneously, the greater saphenous vein is obtained from the patient's right thigh using endoscopic vein harvest technique. The saphenous vein is notably good quality conduit. After removal of the saphenous vein, the small surgical incisions in the lower extremity are closed with absorbable suture. Following systemic heparinization, the left internal mammary artery was transected distally noted to have excellent flow.   Extracorporeal Cardiopulmonary Bypass and Myocardial Protection:  The pericardium is opened. The ascending aorta is nondiseased in appearance. The ascending aorta and the right atrium are cannulated for cardiopulmonary bypass.  Adequate heparinization is verified.   A retrograde cardioplegia cannula is placed through the right atrium into the coronary sinus.  The entire pre-bypass portion of the operation was notable for stable hemodynamics.  Cardiopulmonary bypass was begun and the surface of the heart is inspected. Distal target vessels are selected for coronary artery bypass grafting. A cardioplegia cannula is placed in the ascending aorta.    The patient is allowed to cool passively to 33 C systemic temperature.  The aortic cross clamp is applied and cold blood cardioplegia is delivered initially in an antegrade fashion through the aortic root.   Supplemental cardioplegia is given retrograde through the coronary sinus catheter.  Iced saline slush is applied for topical hypothermia.  The initial cardioplegic arrest is rapid with early diastolic arrest.  Repeat doses of cardioplegia are administered intermittently throughout the entire  cross clamp portion of the  operation through the aortic root,  through the coronary sinus catheter, and through subsequently placed vein grafts in order to maintain completely flat electrocardiogram.  Myocardial protection was felt to be  excellent.   Coronary Artery Bypass Grafting:    The  diagonal branch of the left anterior descending coronary artery was grafted using a reversed saphenous vein graft in an end-to-side fashion.  At the site of distal anastomosis the target vessel was fair quality due to circumferential and diffuse plaque; it measured approximately 2 mm in diameter. Anastomotic patency was confirmed with indocyanine green fluorescence imaging (SPY).  The obtuse marginal branch of the left circumflex coronary artery was grafted using a reversed saphenous vein graft in an end-to-side fashion.  At the site of distal anastomosis the target vessel was fair quality and measured approximately 2 mm in diameter.Anastomotic patency was confirmed with indocyanine green fluorescence imaging (SPY).  The apical portion of the LAD  coronary artery was grafted in and end-to-side fashion with saphenous vein graft.  At the site of distal anastomosis the apical LAD was fair quality and measured approximately 1.5 mm in diameter. Anastomotic patency was confirmed with indocyanine green fluorescence imaging (SPY). This graft was done based on preoperative imaging showing multiple lesions at the most distal aspect of the LAD that the LIMA would be unlikely to perfuse  The distal left anterior coronary artery was grafted with the left internal mammary artery in an end-to-side fashion.  At the site of distal anastomosis the target vessel was fair quality and measured approximately 2 mm in diameter. Anastomotic patency was confirmed with indocyanine green fluorescence imaging (SPY).  All proximal vein graft anastomoses were placed directly to the ascending aorta prior to removal of the aortic cross clamp.    The aortic cross clamp was removed after a total cross clamp time of 93 minutes.   Procedure Completion:  All proximal and distal coronary anastomoses were inspected for hemostasis and appropriate graft orientation. Epicardial pacing wires are fixed to the right ventricular outflow tract and to the right atrial appendage. The patient is rewarmed to 37C temperature. The patient is weaned and disconnected from cardiopulmonary bypass.  The patient's rhythm at separation from bypass was NSR.  The patient was weaned from cardiopulmonary bypass  without any inotropic support.   Followup transesophageal echocardiogram performed after separation from bypass revealed  no changes from the preoperative exam.  The aortic and venous cannula were removed uneventfully. Protamine was administered to reverse the anticoagulation. The mediastinum and pleural space were inspected for hemostasis and irrigated with saline solution. The mediastinum and left pleural space were drained using 28 Fr fluted chest tubes placed through separate stab incisions inferiorly.  The soft tissues anterior to the aorta were reapproximated loosely. The sternum is closed with double strength sternal wire. The soft tissues anterior to the sternum were closed in multiple layers and the skin is closed with a running subcuticular skin closure.  The post-bypass portion of the operation was notable for stable rhythm and hemodynamics.  No blood products were administered during the operation.   Disposition:  The patient tolerated the procedure well and is transported to the surgical intensive care in stable condition. There are no intraoperative complications. All sponge instrument and needle counts are verified correct at completion of the operation.    Brantley FlingB. Zane Mark Benecke,  MD 06/10/2019 5:49 PM

## 2019-06-10 NOTE — OR Nursing (Signed)
11:30am - To assess distal Myocardial Perfusion and patency of grafts - 25mg  of Spy Agent Green was reconstituted w/ 20 mls of sterile water.  0.38mls of Spy Agent Nyoka Cowden was injected into 1,000 ml bag of saline. Then 30 mls of this mixture was drawn up and delivered to sterile field - which is mixed w/ 17mls of patient's heparinized blood

## 2019-06-11 ENCOUNTER — Inpatient Hospital Stay (HOSPITAL_COMMUNITY): Payer: 59

## 2019-06-11 ENCOUNTER — Encounter (HOSPITAL_COMMUNITY): Payer: Self-pay | Admitting: Cardiothoracic Surgery

## 2019-06-11 LAB — BASIC METABOLIC PANEL
Anion gap: 9 (ref 5–15)
Anion gap: 8 (ref 5–15)
BUN: 8 mg/dL (ref 8–23)
BUN: 10 mg/dL (ref 8–23)
CO2: 21 mmol/L — ABNORMAL LOW (ref 22–32)
CO2: 25 mmol/L (ref 22–32)
Calcium: 8.2 mg/dL — ABNORMAL LOW (ref 8.9–10.3)
Calcium: 8.4 mg/dL — ABNORMAL LOW (ref 8.9–10.3)
Chloride: 107 mmol/L (ref 98–111)
Chloride: 103 mmol/L (ref 98–111)
Creatinine, Ser: 0.91 mg/dL (ref 0.44–1.00)
Creatinine, Ser: 1.08 mg/dL — ABNORMAL HIGH (ref 0.44–1.00)
GFR calc Af Amer: >60 mL/min (ref >60)
GFR calc Af Amer: >60 mL/min (ref >60)
GFR calc non Af Amer: >60 mL/min (ref >60)
GFR calc non Af Amer: 55 mL/min — ABNORMAL LOW (ref >60)
Glucose, Bld: 120 mg/dL — ABNORMAL HIGH (ref 70–99)
Glucose, Bld: 130 mg/dL — ABNORMAL HIGH (ref 70–99)
Potassium: 4.1 mmol/L (ref 3.5–5.1)
Potassium: 4 mmol/L (ref 3.5–5.1)
Sodium: 137 mmol/L (ref 135–145)
Sodium: 136 mmol/L (ref 135–145)

## 2019-06-11 LAB — CBC
HCT: 28.2 % — ABNORMAL LOW (ref 36.0–46.0)
HCT: 29.4 % — ABNORMAL LOW (ref 36.0–46.0)
Hemoglobin: 8.9 g/dL — ABNORMAL LOW (ref 12.0–15.0)
Hemoglobin: 9.2 g/dL — ABNORMAL LOW (ref 12.0–15.0)
MCH: 27.7 pg (ref 26.0–34.0)
MCH: 27.1 pg (ref 26.0–34.0)
MCHC: 31.6 g/dL (ref 30.0–36.0)
MCHC: 31.3 g/dL (ref 30.0–36.0)
MCV: 87.9 fL (ref 80.0–100.0)
MCV: 86.5 fL (ref 80.0–100.0)
Platelets: 171 10*3/uL (ref 150–400)
Platelets: 172 10*3/uL (ref 150–400)
RBC: 3.21 MIL/uL — ABNORMAL LOW (ref 3.87–5.11)
RBC: 3.4 MIL/uL — ABNORMAL LOW (ref 3.87–5.11)
RDW: 12.8 % (ref 11.5–15.5)
RDW: 12.9 % (ref 11.5–15.5)
WBC: 10.2 10*3/uL (ref 4.0–10.5)
WBC: 12 10*3/uL — ABNORMAL HIGH (ref 4.0–10.5)
nRBC: 0 % (ref 0.0–0.2)
nRBC: 0 % (ref 0.0–0.2)

## 2019-06-11 LAB — GLUCOSE, CAPILLARY
Glucose-Capillary: 103 mg/dL — ABNORMAL HIGH (ref 70–99)
Glucose-Capillary: 104 mg/dL — ABNORMAL HIGH (ref 70–99)
Glucose-Capillary: 105 mg/dL — ABNORMAL HIGH (ref 70–99)
Glucose-Capillary: 110 mg/dL — ABNORMAL HIGH (ref 70–99)
Glucose-Capillary: 114 mg/dL — ABNORMAL HIGH (ref 70–99)
Glucose-Capillary: 114 mg/dL — ABNORMAL HIGH (ref 70–99)
Glucose-Capillary: 115 mg/dL — ABNORMAL HIGH (ref 70–99)
Glucose-Capillary: 116 mg/dL — ABNORMAL HIGH (ref 70–99)
Glucose-Capillary: 139 mg/dL — ABNORMAL HIGH (ref 70–99)
Glucose-Capillary: 152 mg/dL — ABNORMAL HIGH (ref 70–99)
Glucose-Capillary: 172 mg/dL — ABNORMAL HIGH (ref 70–99)
Glucose-Capillary: 96 mg/dL (ref 70–99)

## 2019-06-11 LAB — MAGNESIUM
Magnesium: 2.6 mg/dL — ABNORMAL HIGH (ref 1.7–2.4)
Magnesium: 2.5 mg/dL — ABNORMAL HIGH (ref 1.7–2.4)

## 2019-06-11 MED ORDER — ENOXAPARIN SODIUM 40 MG/0.4ML ~~LOC~~ SOLN
40.0000 mg | Freq: Every day | SUBCUTANEOUS | Status: DC
  Administered 2019-06-11 – 2019-06-15 (×5): 40 mg via SUBCUTANEOUS
  Filled 2019-06-11 (×5): qty 0.4

## 2019-06-11 MED ORDER — CHLORHEXIDINE GLUCONATE CLOTH 2 % EX PADS: 6.0000 | MEDICATED_PAD | Freq: Every day | CUTANEOUS | Status: DC

## 2019-06-11 MED ORDER — SODIUM CHLORIDE 0.9% FLUSH
10.0000 mL | Freq: Two times a day (BID) | INTRAVENOUS | Status: DC
  Administered 2019-06-11 – 2019-06-13 (×3): 10 mL

## 2019-06-11 MED ORDER — INSULIN ASPART 100 UNIT/ML ~~LOC~~ SOLN
0.0000 [IU] | SUBCUTANEOUS | Status: DC
  Administered 2019-06-11: 4 [IU] via SUBCUTANEOUS

## 2019-06-11 MED ORDER — SODIUM CHLORIDE 0.9% FLUSH: 10.0000 mL | INTRAVENOUS | Status: DC | PRN

## 2019-06-11 MED ORDER — FUROSEMIDE 10 MG/ML IJ SOLN
40.0000 mg | Freq: Once | INTRAMUSCULAR | Status: AC
  Administered 2019-06-11: 40 mg via INTRAVENOUS
  Filled 2019-06-11: qty 4

## 2019-06-11 MED ORDER — INSULIN ASPART 100 UNIT/ML ~~LOC~~ SOLN
0.0000 [IU] | SUBCUTANEOUS | Status: DC
  Administered 2019-06-11 – 2019-06-14 (×7): 2 [IU] via SUBCUTANEOUS

## 2019-06-11 MED ORDER — CHLORHEXIDINE GLUCONATE CLOTH 2 % EX PADS
6.0000 | MEDICATED_PAD | Freq: Every day | CUTANEOUS | Status: DC
  Administered 2019-06-11 – 2019-06-13 (×3): 6 via TOPICAL

## 2019-06-11 MED FILL — Mannitol IV Soln 20%: INTRAVENOUS | Qty: 500 | Status: AC

## 2019-06-11 MED FILL — Heparin Sodium (Porcine) Inj 1000 Unit/ML: INTRAMUSCULAR | Qty: 10 | Status: AC

## 2019-06-11 MED FILL — Magnesium Sulfate Inj 50%: INTRAMUSCULAR | Qty: 10 | Status: AC

## 2019-06-11 MED FILL — Heparin Sod (Porcine)-NaCl IV Soln 1000 Unit/500ML-0.9%: INTRAVENOUS | Qty: 500 | Status: AC

## 2019-06-11 MED FILL — Sodium Bicarbonate IV Soln 8.4%: INTRAVENOUS | Qty: 50 | Status: AC

## 2019-06-11 MED FILL — Lidocaine HCl Local Soln Prefilled Syringe 100 MG/5ML (2%): INTRAMUSCULAR | Qty: 5 | Status: AC

## 2019-06-11 MED FILL — Potassium Chloride Inj 2 mEq/ML: INTRAVENOUS | Qty: 40 | Status: AC

## 2019-06-11 MED FILL — Electrolyte-R (PH 7.4) Solution: INTRAVENOUS | Qty: 4000 | Status: AC

## 2019-06-11 MED FILL — Heparin Sodium (Porcine) Inj 1000 Unit/ML: INTRAMUSCULAR | Qty: 30 | Status: AC

## 2019-06-11 NOTE — Progress Notes (Signed)
TCTS DAILY ICU PROGRESS NOTE                   [address]            [place] [id number]          [phone]   1 Day Post-Op Procedure(s) (LRB): CORONARY ARTERY BYPASS GRAFTING (CABG) x Four, using left internal mammary artery and right leg greater saphenous vein harvested endoscopically (N/A) TRANSESOPHAGEAL ECHOCARDIOGRAM (TEE) (N/A)  Total Length of Stay:  LOS: 3 days   Subjective:  Patient having some pain.  Used a large amount of Morphine overnight.  Objective: Vital signs in last 24 hours: Temp:  [96.1 F (35.6 C)-100.2 F (37.9 C)] 99.7 F (37.6 C) (08/06 0600) Pulse Rate:  [77-102] 100 (08/06 0600) Cardiac Rhythm: Normal sinus rhythm (08/06 0400) Resp:  [0-24] 13 (08/06 0600) BP: (92-138)/(62-87) 137/80 (08/06 0600) SpO2:  [100 %] 100 % (08/06 0600) Arterial Line BP: (96-164)/(53-70) 139/61 (08/06 0600) FiO2 (%):  [40 %-50 %] 40 % (08/05 1810) Weight:  [96.3 kg] 96.3 kg (08/06 0500)  Filed Weights   06/09/19 0437 06/10/19 0548 06/11/19 0500  Weight: 87.8 kg 88.8 kg 96.3 kg    Weight change: 7.486 kg   Hemodynamic parameters for last 24 hours: PAP: (20-29)/(9-19) 29/15 CO:  [2.4 L/min-4.7 L/min] 4.7 L/min CI:  [1.3 L/min/m2-2.5 L/min/m2] 2.5 L/min/m2  Intake/Output from previous day: 08/05 0701 - 08/06 0700 In: 4682.6 [I.V.:3778.5; IV Piggyback:904] Out: 3975 [Urine:2555; Blood:1000; Chest Tube:420]  Current Meds: Scheduled Meds: . acetaminophen  1,000 mg Oral Q6H   Or  . acetaminophen (TYLENOL) oral liquid 160 mg/5 mL  1,000 mg Per Tube Q6H  . aspirin EC  325 mg Oral Daily   Or  . aspirin  324 mg Per Tube Daily  . atorvastatin  80 mg Oral q1800  . bisacodyl  10 mg Oral Daily   Or  . bisacodyl  10 mg Rectal Daily  . Chlorhexidine Gluconate Cloth  6 each Topical Daily  . docusate sodium  200 mg Oral Daily  . enoxaparin (LOVENOX) injection  40 mg Subcutaneous QHS  . insulin aspart  0-24 Units Subcutaneous Q4H  . insulin aspart   0-24 Units Subcutaneous Q4H  . metoprolol tartrate  12.5 mg Oral BID   Or  . metoprolol tartrate  12.5 mg Per Tube BID  . [START ON 06/12/2019] pantoprazole  40 mg Oral Daily  . sodium chloride flush  10-40 mL Intracatheter Q12H  . sodium chloride flush  3 mL Intravenous Q12H   Continuous Infusions: . sodium chloride 10 mL/hr at 06/11/19 0600  . sodium chloride    . sodium chloride 20 mL/hr at 06/10/19 1439  . albumin human 12.5 g (06/10/19 1633)  . cefUROXime (ZINACEF)  IV Stopped (06/11/19 0553)  . dexmedetomidine (PRECEDEX) IV infusion Stopped (06/10/19 1653)  . famotidine (PEPCID) IV 20 mg (06/10/19 1400)  . lactated ringers    . lactated ringers 10 mL/hr at 06/11/19 0600  . lactated ringers Stopped (06/10/19 1548)  . nitroGLYCERIN 18 mcg/min (06/11/19 0600)  . phenylephrine (NEO-SYNEPHRINE) Adult infusion Stopped (06/10/19 1857)   PRN Meds:.sodium chloride, albumin human, lactated ringers, metoprolol tartrate, midazolam, morphine injection, ondansetron (ZOFRAN) IV, oxyCODONE, sodium chloride flush, sodium chloride flush, traMADol  General appearance: alert, cooperative and no distress Heart: regular rate and rhythm Lungs: clear to auscultation bilaterally Abdomen: soft, non-tender; bowel sounds normal; no masses,  no organomegaly Extremities: edema trace Wound: clean and  dry EVH sites, aquacel in place  Lab Results: CBC: Recent Labs    06/10/19 1941  06/10/19 2014 06/11/19 0401  WBC 8.1  --   --  10.2  HGB 8.5*   < > 8.8* 8.9*  HCT 27.0*   < > 26.0* 28.2*  PLT 150  --   --  171   < > = values in this interval not displayed.   BMET:  Recent Labs    06/10/19 0501  06/10/19 2014 06/11/19 0401  NA 140   < > 141 137  K 3.8   < > 4.5 4.1  CL 107  --  106 107  CO2 24  --   --  21*  GLUCOSE 91   < > 104* 120*  BUN 14  --  9 8  CREATININE 1.00   < > 0.80 0.91  CALCIUM 8.6*  --   --  8.2*   < > = values in this interval not displayed.    CMET: Lab Results   Component Value Date   WBC 10.2 06/11/2019   HGB 8.9 (L) 06/11/2019   HCT 28.2 (L) 06/11/2019   PLT 171 06/11/2019   GLUCOSE 120 (H) 06/11/2019   CHOL 177 12/10/2018   TRIG 68 12/10/2018   HDL 54 12/10/2018   LDLCALC 109 (H) 12/10/2018   ALT 22 06/08/2019   AST 24 06/08/2019   NA 137 06/11/2019   K 4.1 06/11/2019   CL 107 06/11/2019   CREATININE 0.91 06/11/2019   BUN 8 06/11/2019   CO2 21 (L) 06/11/2019   INR 1.5 (H) 06/10/2019   HGBA1C 5.9 (H) 06/09/2019      PT/INR:  Recent Labs    06/10/19 1357  LABPROT 17.6*  INR 1.5*   Radiology: Dg Chest Port 1 View  Result Date: 06/11/2019 CLINICAL DATA:  CABG follow-up EXAM: PORTABLE CHEST 1 VIEW COMPARISON:  06/10/2019 FINDINGS: Endotracheal tube removed. NG tube removed. Swan-Ganz catheter main pulmonary artery. Mediastinal drain remains in place. Left chest tube remains in place.  No pneumothorax. Progression of vascular congestion. Progression of bilateral pleural effusions left greater than right and bibasilar atelectasis. IMPRESSION: Endotracheal tube removed. Significant worsening in aeration lungs. Increased vascular congestion and bilateral effusions. Progression of bibasilar atelectasis. Electronically Signed   By: Franchot Gallo M.D.   On: 06/11/2019 08:21   Dg Chest Port 1 View  Result Date: 06/10/2019 CLINICAL DATA:  Patient status post CABG today. EXAM: PORTABLE CHEST 1 VIEW COMPARISON:  Single-view of the chest 06/08/2019. FINDINGS: Endotracheal tube tip is in the right mainstem bronchus. The tube should be withdrawn approximately 4.5 cm. Right IJ approach Swan-Ganz catheter tip is in the distal pulmonary outflow tract. Left chest tube and mediastinal drain are noted. NG tube is in the stomach. There is mild left basilar atelectasis. No pneumothorax. Heart size is normal. IMPRESSION: Endotracheal tube tip is in the right mainstem bronchus and should be withdrawn approximately 4.5 cm. Mild left basilar atelectasis. These  results were called by telephone at the time of interpretation on 06/10/2019 at 2:35 pm to the patient's nurse Mervin Kung, who verbally acknowledged these results. Electronically Signed   By: Inge Rise M.D.   On: 06/10/2019 14:36     Assessment/Plan: S/P Procedure(s) (LRB): CORONARY ARTERY BYPASS GRAFTING (CABG) x Four, using left internal mammary artery and right leg greater saphenous vein harvested endoscopically (N/A) TRANSESOPHAGEAL ECHOCARDIOGRAM (TEE) (N/A)  1. CV- NSR, + HTN- on NTG drip, will start Lopressor today, if  needed can start low dose ACE if creatinine is stable 2. Pulm- bilateral pleural effusions/atelectasis, CT output 420 cc leave in place 3. Renal- creatinine stable, weight up 15 lbs, will give IV lasix today 4. Expected post operative blood loss anemia, Hgb 8.9 monitor 5. CBGs controlled- start SSIP 6. Dispo- patient stable, + HTN, wean NTG drip as able, she is volume overloaded, will give IV Lasix today, leave chest tubes today, POD #1 progression orders     Lowella Dandyrin Kito Cuffe 06/11/2019 8:48 AM

## 2019-06-11 NOTE — Progress Notes (Signed)
      [address]       [place] [address]             [phone]      POD # 1   Up in chair  BP (!) 144/88   Pulse 94   Temp 97.8 F (36.6 C) (Oral)   Resp 16   Ht 5' (1.524 m)   Wt 96.3 kg   SpO2 92%   BMI 41.46 kg/m   Intake/Output Summary (Last 24 hours) at 06/11/2019 1751 Last data filed at 06/11/2019 1700 Gross per 24 hour  Intake 1915.87 ml  Output 2885 ml  Net -969.13 ml   Creatinine 1.08, K= 4.0 Hct= 29  Doing well POD # 1  Latishia Suitt C. Roxan Hockey, MD [facility] [phone]

## 2019-06-11 NOTE — Anesthesia Postprocedure Evaluation (Signed)
Anesthesia Post Note  Patient: Kimberly Bryant  Procedure(s) Performed: CORONARY ARTERY BYPASS GRAFTING (CABG) x Four, using left internal mammary artery and right leg greater saphenous vein harvested endoscopically (N/A Chest) TRANSESOPHAGEAL ECHOCARDIOGRAM (TEE) (N/A )     Patient location during evaluation: SICU Anesthesia Type: General Level of consciousness: awake and alert Pain management: pain level controlled Vital Signs Assessment: post-procedure vital signs reviewed and stable Respiratory status: spontaneous breathing Cardiovascular status: stable Postop Assessment: no apparent nausea or vomiting Anesthetic complications: no Comments: Mild pain.     Last Vitals:  Vitals:   06/11/19 0500 06/11/19 0600  BP: 124/83 137/80  Pulse: 99 100  Resp: 18 13  Temp: 37.7 C 37.6 C  SpO2: 100% 100%    Last Pain:  Vitals:   06/11/19 0000  TempSrc: Core  PainSc:                  Effie Berkshire

## 2019-06-12 ENCOUNTER — Inpatient Hospital Stay (HOSPITAL_COMMUNITY): Payer: 59

## 2019-06-12 ENCOUNTER — Telehealth: Payer: Self-pay | Admitting: Physician Assistant

## 2019-06-12 LAB — GLUCOSE, CAPILLARY
Glucose-Capillary: 103 mg/dL — ABNORMAL HIGH (ref 70–99)
Glucose-Capillary: 107 mg/dL — ABNORMAL HIGH (ref 70–99)
Glucose-Capillary: 118 mg/dL — ABNORMAL HIGH (ref 70–99)
Glucose-Capillary: 119 mg/dL — ABNORMAL HIGH (ref 70–99)
Glucose-Capillary: 144 mg/dL — ABNORMAL HIGH (ref 70–99)
Glucose-Capillary: 90 mg/dL (ref 70–99)

## 2019-06-12 LAB — CBC
HCT: 29.7 % — ABNORMAL LOW (ref 36.0–46.0)
Hemoglobin: 9.3 g/dL — ABNORMAL LOW (ref 12.0–15.0)
MCH: 28.1 pg (ref 26.0–34.0)
MCHC: 31.3 g/dL (ref 30.0–36.0)
MCV: 89.7 fL (ref 80.0–100.0)
Platelets: 191 10*3/uL (ref 150–400)
RBC: 3.31 MIL/uL — ABNORMAL LOW (ref 3.87–5.11)
RDW: 13.1 % (ref 11.5–15.5)
WBC: 12.5 10*3/uL — ABNORMAL HIGH (ref 4.0–10.5)
nRBC: 0 % (ref 0.0–0.2)

## 2019-06-12 LAB — BASIC METABOLIC PANEL
Anion gap: 8 (ref 5–15)
Anion gap: 7 (ref 5–15)
BUN: 18 mg/dL (ref 8–23)
BUN: 21 mg/dL (ref 8–23)
CO2: 25 mmol/L (ref 22–32)
CO2: 24 mmol/L (ref 22–32)
Calcium: 8.7 mg/dL — ABNORMAL LOW (ref 8.9–10.3)
Calcium: 8.2 mg/dL — ABNORMAL LOW (ref 8.9–10.3)
Chloride: 101 mmol/L (ref 98–111)
Chloride: 101 mmol/L (ref 98–111)
Creatinine, Ser: 1.8 mg/dL — ABNORMAL HIGH (ref 0.44–1.00)
Creatinine, Ser: 1.27 mg/dL — ABNORMAL HIGH (ref 0.44–1.00)
GFR calc Af Amer: 34 mL/min — ABNORMAL LOW (ref >60)
GFR calc Af Amer: 52 mL/min — ABNORMAL LOW (ref >60)
GFR calc non Af Amer: 30 mL/min — ABNORMAL LOW (ref >60)
GFR calc non Af Amer: 45 mL/min — ABNORMAL LOW (ref >60)
Glucose, Bld: 119 mg/dL — ABNORMAL HIGH (ref 70–99)
Glucose, Bld: 120 mg/dL — ABNORMAL HIGH (ref 70–99)
Potassium: 4.4 mmol/L (ref 3.5–5.1)
Potassium: 3.8 mmol/L (ref 3.5–5.1)
Sodium: 134 mmol/L — ABNORMAL LOW (ref 135–145)
Sodium: 132 mmol/L — ABNORMAL LOW (ref 135–145)

## 2019-06-12 MED ORDER — METOPROLOL TARTRATE 25 MG PO TABS
25.0000 mg | ORAL_TABLET | Freq: Two times a day (BID) | ORAL | Status: DC
  Administered 2019-06-12: 25 mg via ORAL
  Filled 2019-06-12: qty 1

## 2019-06-12 MED ORDER — PHENYLEPHRINE HCL-NACL 10-0.9 MG/250ML-% IV SOLN
INTRAVENOUS | Status: AC
  Filled 2019-06-12: qty 500

## 2019-06-12 MED ORDER — CLOPIDOGREL BISULFATE 75 MG PO TABS
75.0000 mg | ORAL_TABLET | Freq: Every day | ORAL | Status: DC
  Administered 2019-06-12 – 2019-06-16 (×5): 75 mg via ORAL
  Filled 2019-06-12 (×5): qty 1

## 2019-06-12 MED ORDER — ASPIRIN EC 81 MG PO TBEC
81.0000 mg | DELAYED_RELEASE_TABLET | Freq: Every day | ORAL | Status: DC
  Administered 2019-06-13 – 2019-06-16 (×4): 81 mg via ORAL
  Filled 2019-06-12 (×4): qty 1

## 2019-06-12 MED ORDER — METOPROLOL TARTRATE 25 MG/10 ML ORAL SUSPENSION: 12.5000 mg | Freq: Two times a day (BID) | ORAL | Status: DC

## 2019-06-12 NOTE — Discharge Instructions (Signed)
Coronary Artery Bypass Grafting, Care After °This sheet gives you information about how to care for yourself after your procedure. Your doctor may also give you more specific instructions. If you have problems or questions, call your doctor. °What can I expect after the procedure? °After the procedure, it is common to: °· Feel sick to your stomach (nauseous). °· Not want to eat as much as normal (lack of appetite). °· Have trouble pooping (constipation). °· Have weakness and tiredness (fatigue). °· Feel sad (depressed) or grouchy (irritable). °· Have pain or discomfort around the cuts from surgery (incisions). °Follow these instructions at home: °Medicines °· Take over-the-counter and prescription medicines only as told by your doctor. Do not stop taking medicines or start any new medicines unless your doctor says it is okay. °· If you were prescribed an antibiotic medicine, take it as told by your doctor. Do not stop taking the antibiotic even if you start to feel better. °Incision care ° °· Follow instructions from your doctor about how to take care of your cuts from surgery. Make sure you: °? Wash your hands with soap and water before and after you change your bandage (dressing). If you cannot use soap and water, use hand sanitizer. °? Change your bandage as told by your doctor. °? Leave stitches (sutures), skin glue, or skin tape (adhesive) strips in place. They may need to stay in place for 2 weeks or longer. If tape strips get loose and curl up, you may trim the loose edges. Do not remove tape strips completely unless your doctor says it is okay. °· Make sure the surgery cuts are clean, dry, and protected. °· Check your cut areas every day for signs of infection. Check for: °? More redness, swelling, or pain. °? More fluid or blood. °? Warmth. °? Pus or a bad smell. °· If cuts were made in your legs: °? Avoid crossing your legs. °? Avoid sitting for long periods of time. Change positions every 30  minutes. °? Raise (elevate) your legs when you are sitting. °Bathing °· Do not take baths, swim, or use a hot tub until your doctor says it is okay. °· You may shower. Pat the surgery cuts dry. Do not rub the cuts to dry. ° °Eating and drinking ° °· Eat foods that are high in fiber, such as beans, nuts, whole grains, and raw fruits and vegetables. Any meats you eat should be lean cut. Avoid canned, processed, and fried foods. This can help prevent trouble pooping. This is also a part of a heart-healthy diet. °· Drink enough fluid to keep your pee (urine) pale yellow. °· Do not drink alcohol until you are fully recovered. Ask your doctor when it is safe to drink alcohol. °Activity °· Rest and limit your activity as told by your doctor. You may be told to: °? Stop any activity right away if you have chest pain, shortness of breath, irregular heartbeats, or dizziness. Get help right away if you have any of these symptoms. °? Move around often for short periods or take short walks as told by your doctor. Slowly increase your activities. °? Avoid lifting, pushing, or pulling anything that is heavier than 10 lb (4.5 kg) for at least 6 weeks or as told by your doctor. °· Do physical therapy or a cardiac rehab (cardiac rehabilitation) program as told by your doctor. °? Physical therapy involves doing exercises to maintain movement and build strength and endurance. °? A cardiac rehab program includes: °§ Exercise   training. °§ Education. °§ Counseling. °· Do not drive until your doctor says it is okay. °· Ask your doctor when you can go back to work. °· Ask your doctor when you can be sexually active. °General instructions °· Do not drive or use heavy machinery while taking prescription pain medicine. °· Do not use any products that contain nicotine or tobacco. These include cigarettes, e-cigarettes, and chewing tobacco. If you need help quitting, ask your doctor. °· Take 2-3 deep breaths every few hours during the day while  you get better. This helps expand your lungs and prevent problems. °· If you were given a device called an incentive spirometer, use it several times a day to practice deep breathing. Support your chest with a pillow or your arms when you take deep breaths or cough. °· Wear compression stockings as told by your doctor. °· Weigh yourself every day. This helps to see if your body is holding (retaining) fluid that may make your heart and lungs work harder. °· Keep all follow-up visits as told by your doctor. This is important. °Contact a doctor if: °· You have more redness, swelling, or pain around any cut. °· You have more fluid or blood coming from any cut. °· Any cut feels warm to the touch. °· You have pus or a bad smell coming from any cut. °· You have a fever. °· You have swelling in your ankles or legs. °· You have pain in your legs. °· You gain 2 lb (0.9 kg) or more a day. °· You feel sick to your stomach or you throw up (vomit). °· You have watery poop (diarrhea). °Get help right away if: °· You have chest pain that goes to your jaw or arms. °· You are short of breath. °· You have a fast or irregular heartbeat. °· You notice a "clicking" in your breastbone (sternum) when you move. °· You have any signs of a stroke. "BE FAST" is an easy way to remember the main warning signs: °? B - Balance. Signs are dizziness, sudden trouble walking, or loss of balance. °? E - Eyes. Signs are trouble seeing or a change in how you see. °? F - Face. Signs are sudden weakness or loss of feeling of the face, or the face or eyelid drooping on one side. °? A - Arms. Signs are weakness or loss of feeling in an arm. This happens suddenly and usually on one side of the body. °? S - Speech. Signs are sudden trouble speaking, slurred speech, or trouble understanding what people say. °? T - Time. Time to call emergency services. Write down what time symptoms started. °· You have other signs of a stroke, such as: °? A sudden, very bad  headache with no known cause. °? Feeling sick to your stomach. °? Throwing up. °? Jerky movements you cannot control (seizure). °These symptoms may be an emergency. Do not wait to see if the symptoms will go away. Get medical help right away. Call your local emergency services (911 in the [place] Do not drive yourself to the hospital. °Summary °· After the procedure, it is common to have pain or discomfort in the cuts from surgery (incisions). °· Do not take baths, swim, or use a hot tub until your doctor says it is okay. °· Slowly increase your activities. You may need physical therapy or cardiac rehab. °· Weigh yourself every day. This helps to see if your body is holding fluid. °This information is not intended   to replace advice given to you by your health care provider. Make sure you discuss any questions you have with your health care provider. °Document Released: 10/27/2013 Document Revised: 07/01/2018 Document Reviewed: 07/01/2018 °Elsevier Patient Education © 2020 [employer] ° °

## 2019-06-12 NOTE — Progress Notes (Signed)
2 Days Post-Op Procedure(s) (LRB): CORONARY ARTERY BYPASS GRAFTING (CABG) x Four, using left internal mammary artery and right leg greater saphenous vein harvested endoscopically (N/A) TRANSESOPHAGEAL ECHOCARDIOGRAM (TEE) (N/A) Subjective: Without complaints  Objective: Vital signs in last 24 hours: Temp:  [97.8 F (36.6 C)-98.2 F (36.8 C)] 98.2 F (36.8 C) (08/07 0400) Pulse Rate:  [76-103] 81 (08/07 0700) Cardiac Rhythm: Normal sinus rhythm (08/07 0400) Resp:  [7-19] 7 (08/07 0700) BP: (120-144)/(75-100) 122/75 (08/07 0700) SpO2:  [91 %-100 %] 99 % (08/07 0700) Arterial Line BP: (142)/(65) 142/65 (08/06 0930) Weight:  [88.7 kg] 88.7 kg (08/07 0600)  Hemodynamic parameters for last 24 hours:    Intake/Output from previous day: 08/06 0701 - 08/07 0700 In: 991 [P.O.:480; I.V.:260.9; IV Piggyback:250.1] Out: 1900 [Urine:1380; Chest Tube:520] Intake/Output this shift: No intake/output data recorded.  General appearance: alert and no distress Neurologic: intact Heart: regular rate and rhythm, S1, S2 normal, no murmur, click, rub or gallop Lungs: diminished breath sounds bibasilar Wound: dressed  Lab Results: Recent Labs    06/11/19 1652 06/12/19 0335  WBC 12.0* 12.5*  HGB 9.2* 9.3*  HCT 29.4* 29.7*  PLT 172 191   BMET:  Recent Labs    06/11/19 1652 06/12/19 0335  NA 136 134*  K 4.0 4.4  CL 103 101  CO2 25 25  GLUCOSE 130* 119*  BUN 10 18  CREATININE 1.08* 1.80*  CALCIUM 8.4* 8.7*    PT/INR:  Recent Labs    06/10/19 1357  LABPROT 17.6*  INR 1.5*   ABG    Component Value Date/Time   PHART 7.312 (L) 06/10/2019 1947   HCO3 23.1 06/10/2019 1947   TCO2 22 06/10/2019 2014   ACIDBASEDEF 3.0 (H) 06/10/2019 1947   O2SAT 97.0 06/10/2019 1947   CBG (last 3)  Recent Labs    06/11/19 2008 06/11/19 2336 06/12/19 0326  GLUCAP 110* 118* 107*    Assessment/Plan: S/P Procedure(s) (LRB): CORONARY ARTERY BYPASS GRAFTING (CABG) x Four, using left internal  mammary artery and right leg greater saphenous vein harvested endoscopically (N/A) TRANSESOPHAGEAL ECHOCARDIOGRAM (TEE) (N/A) Mobilize d/c tubes/lines creatinine slightly elevated; gentle hydration; hold diuresis; needs aggressive pulmonary toilet   LOS: 4 days    Kimberly Bryant 06/12/2019

## 2019-06-12 NOTE — Discharge Summary (Signed)
Physician Discharge Summary  Patient ID: Kimberly Bryant MRN: [id number] DOB/AGE: 04-16-1956 63 y.o.  Admit date: 06/08/2019 Discharge date: 06/16/2019  Admission Diagnoses: Acute coronary syndrome  Discharge Diagnoses:  Principal Problem:   ACS (acute coronary syndrome) ([place]) Active Problems:   HTN (hypertension)   Hyperlipidemia   Atypical chest pain   Elevated troponin   Hypertensive urgency   NSTEMI (non-ST elevated myocardial infarction) ([place])   S/P CABG x 4  Patient Active Problem List   Diagnosis Date Noted  . 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 Atypical chest pain   . Elevated troponin   . Hypertensive urgency   . NSTEMI (non-ST elevated myocardial infarction) ([place])   . ACS (acute coronary syndrome) ([place]) 06/08/2019  . CAD (coronary artery disease) 09/15/2018  . HTN (hypertension) 09/15/2018  . GERD (gastroesophageal reflux disease) 09/15/2018  . Chest pain, atypical 09/15/2018  . Hyperlipidemia 09/15/2018   History of present illness:  The patient is a 63 year old female with history of multiple cardiac comorbidities including hypertension, and hyperlipidemia with previous history of myocardial infarction who presented with a non-STEMI to the emergency department.  She was also noted to have an angioedema reaction to ACE inhibitor at presentation.  Her peak sensitive troponin was 7859.  She was seen in cardiology consultation and placed on intravenous heparin, admitted to the hospital and full diagnostic testing was undertaken.  Discharged Condition: good  Hospital Course: The patient was admitted through the emergency department for further evaluation and treatment.  This included echocardiogram and cardiac catheterization.  Cardiac catheterization revealed severe multivessel coronary artery disease and due to these findings cardiothoracic surgical consultation was obtained with Dr. Murvin Natal who evaluated the patient and his studies and recommended proceeding with CABG.  On  06/10/2019 she was taken the operating room where she underwent the below described procedure.  She tolerated well was taken to the surgical intensive care unit in stable condition.  Postoperative hospital course:  Patient has maintained stable hemodynamics.  All routine lines, monitors and drainage devices have been discontinued in the standard fashion.  The patient does have an expected acute blood loss anemia which is stable.  Most recent hemoglobin and hematocrit dated 06/14/2019 are 8.3 and 26.2 respectively.  Renal function is within normal limits.  She is tolerating gradually increasing activities using standard cardiac rehab modalities.  Incisions are noted to be healing well without evidence of infection.  She is tolerating room air oxygenation with good saturations.  At the time of discharge the patient is felt to be quite stable.HH arangements hva been made for PT/RN   Consults: cardiology  Significant Diagnostic Studies: angiography: cardiac cath  Treatments: surgery:   CARDIOTHORACIC SURGERY OPERATIVE NOTE  Date of Procedure:    06/10/2019  Preoperative Diagnosis:      Severe 3-vessel Coronary Artery Disease, s/p NSTEMI  Postoperative Diagnosis:    Same  Procedure:        Coronary Artery Bypass Grafting x 4              Left Internal Mammary Artery to Distal Left Anterior Descending Coronary Artery; Saphenous Vein Graft to  Obtuse Marginal Branch of Left Circumflex Coronary Artery, Sapheonous Vein Graft to  Diagonal Branch Coronary Artery; Saphenous vein graft to apical portion of the LAD; Endoscopic Vein Harvest from right Thigh and Lower Leg; completion interrogation of coronary grafts using indocyanine green fluorescence imaging (SPY)  Surgeon:        Wonda Olds, MD  Assistant:  Gershon CraneGold, Wayne PA-C  Anesthesia:    get  Operative Findings: ? good left ventricular systolic function ? good quality left internal mammary artery conduit ? good quality  saphenous vein conduit ? goodquality target vessels for grafting (PDA was deemed to small and diffusely diseased for grafting)    Discharge Exam: Blood pressure (!) 155/90, pulse 86, temperature 99.3 F (37.4 C), temperature source Oral, resp. rate 19, height 5' (1.524 m), weight 91.9 kg, SpO2 99 %.  General appearance: alert, cooperative and no distress Heart: regular rate and rhythm Lungs: mildly dim in bases Abdomen: benign Extremities: minor edema Wound: incis healing well  Disposition: Discharge disposition: 01-Home or Self Care       Discharge Instructions    Discharge patient   Complete by: As directed    When all home health needs have been arranged   Discharge disposition: 01-Home or Self Care   Discharge patient date: 06/16/2019     Allergies as of 06/16/2019      Reactions   Carafate [sucralfate] Swelling, Other (See Comments)   Patient stated it made her face swell (angioedema)- no breathing issues, however   Lisinopril Swelling, Other (See Comments)   Patient stated it made her face swell (angioedema)- no breathing issues, however      Medication List    STOP taking these medications   metoprolol succinate 25 MG 24 hr tablet Commonly known as: TOPROL-XL     TAKE these medications   amLODipine 10 MG tablet Commonly known as: NORVASC Take 1 tablet (10 mg total) by mouth daily.   aspirin 81 MG EC tablet Take 1 tablet (81 mg total) by mouth daily.   atorvastatin 80 MG tablet Commonly known as: LIPITOR Take 1 tablet (80 mg total) by mouth daily at 6 PM. What changed:   medication strength  how much to take  when to take this   clopidogrel 75 MG tablet Commonly known as: PLAVIX Take 1 tablet (75 mg total) by mouth daily.   lansoprazole 30 MG capsule Commonly known as: Prevacid Take 1 capsule (30 mg total) by mouth 2 (two) times daily. To reduce stomach acid   metoprolol tartrate 50 MG tablet Commonly known as: LOPRESSOR Take 1 tablet  (50 mg total) by mouth 2 (two) times daily.   traMADol 50 MG tablet Commonly known as: ULTRAM Take 1 tablet (50 mg total) by mouth every 6 (six) hours as needed for up to 7 days for moderate pain.            Durable Medical Equipment  (From admission, onward)         Start     Ordered   06/16/19 0735  For home use only DME 3 n 1  Once     06/16/19 0735   06/16/19 0735  For home use only DME Walker rolling  Once    Question:  Patient needs a walker to treat with the following condition  Answer:  Physical deconditioning   06/16/19 0735         Follow-up Information    Linden DolinAtkins, Broadus Z, MD Follow up.   Specialty: Cardiothoracic Surgery Why: Please see discharge paperwork for a follow-up appointment with your surgeon.  [facility] 1/2-hour prior to this appointment.  It is in the same office complex. Contact information: [address] [place] [address] [phone]        Manson PasseyBhagat, Bhavinkumar, PA Follow up on 06/25/2019.  Specialty: Cardiology Why: at 1:45pm for your follow up appt.  Contact information: [address] [place] [address] [phone]          The patient has been discharged on:   1.Beta Blocker:  Yes [ y  ]                              No   [   ]                              If No, reason:  2.Ace Inhibitor/ARB: Yes [   ]                                     No  [  n  ]                                     If No, reason:allergy  3.Statin:   Yes [ y  ]                  No  [   ]                  If No, reason:  4.Ecasa:  Yes  [ y  ]                  No   [   ]                  If No, reason:  Signed: Rowe ClackWayne E Gold PA-C 06/16/2019, 7:57 AM

## 2019-06-12 NOTE — Plan of Care (Signed)
  Problem: Clinical Measurements: Goal: Respiratory complications will improve Outcome: Progressing   Problem: Clinical Measurements: Goal: Cardiovascular complication will be avoided Outcome: Progressing   

## 2019-06-12 NOTE — Progress Notes (Signed)
EVENING ROUNDS NOTE :     [address]       [place] [address]             [phone]                 2 Days Post-Op Procedure(s) (LRB): CORONARY ARTERY BYPASS GRAFTING (CABG) x Four, using left internal mammary artery and right leg greater saphenous vein harvested endoscopically (N/A) TRANSESOPHAGEAL ECHOCARDIOGRAM (TEE) (N/A)   Total Length of Stay:  LOS: 4 days  Events:  Slow to progress.  Creat trending up     BP (!) 148/83   Pulse 90   Temp 98 F (36.7 C) (Oral)   Resp 12   Ht 5' (1.524 m)   Wt 88.7 kg   SpO2 92%   BMI 38.19 kg/m         . sodium chloride Stopped (06/11/19 0835)  . sodium chloride    . sodium chloride 20 mL/hr at 06/10/19 1439  . lactated ringers    . lactated ringers 10 mL/hr at 06/12/19 0600  . lactated ringers 50 mL/hr at 06/12/19 0746    I/O last 3 completed shifts: In: 1590.5 [P.O.:480; I.V.:548.8; IV Piggyback:561.7] Out: 3220 [Urine:2480; Chest Tube:740]   CBC Latest Ref Rng & Units 06/12/2019 06/11/2019 06/11/2019  WBC 4.0 - 10.5 K/uL 12.5(H) 12.0(H) 10.2  Hemoglobin 12.0 - 15.0 g/dL 9.3(L) 9.2(L) 8.9(L)  Hematocrit 36.0 - 46.0 % 29.7(L) 29.4(L) 28.2(L)  Platelets 150 - 400 K/uL 191 172 171    BMP Latest Ref Rng & Units 06/12/2019 06/11/2019 06/11/2019  Glucose 70 - 99 mg/dL 119(H) 130(H) 120(H)  BUN 8 - 23 mg/dL 18 10 8   Creatinine 0.44 - 1.00 mg/dL 1.80(H) 1.08(H) 0.91  BUN/Creat Ratio 12 - 28 - - -  Sodium 135 - 145 mmol/L 134(L) 136 137  Potassium 3.5 - 5.1 mmol/L 4.4 4.0 4.1  Chloride 98 - 111 mmol/L 101 103 107  CO2 22 - 32 mmol/L 25 25 21(L)  Calcium 8.9 - 10.3 mg/dL 8.7(L) 8.4(L) 8.2(L)    ABG    Component Value Date/Time   PHART 7.312 (L) 06/10/2019 1947   PCO2ART 45.9 06/10/2019 1947   PO2ART 107.0 06/10/2019 1947   HCO3 23.1 06/10/2019 1947   TCO2 22 06/10/2019 2014   ACIDBASEDEF 3.0 (H) 06/10/2019 1947   O2SAT 97.0 06/10/2019 1947       Melodie Bouillon, MD 06/12/2019 5:44 PM

## 2019-06-12 NOTE — Telephone Encounter (Signed)
New Message     Pt has TOC appt 08/20 @ 1:45pm with Vin Bhagat

## 2019-06-13 ENCOUNTER — Inpatient Hospital Stay (HOSPITAL_COMMUNITY): Payer: 59

## 2019-06-13 LAB — CBC WITH DIFFERENTIAL/PLATELET
Abs Immature Granulocytes: 0.04 10*3/uL (ref 0.00–0.07)
Basophils Absolute: 0 10*3/uL (ref 0.0–0.1)
Basophils Relative: 0 %
Eosinophils Absolute: 0.2 10*3/uL (ref 0.0–0.5)
Eosinophils Relative: 2 %
HCT: 26.9 % — ABNORMAL LOW (ref 36.0–46.0)
Hemoglobin: 8.6 g/dL — ABNORMAL LOW (ref 12.0–15.0)
Immature Granulocytes: 0 %
Lymphocytes Relative: 22 %
Lymphs Abs: 2.4 10*3/uL (ref 0.7–4.0)
MCH: 27.8 pg (ref 26.0–34.0)
MCHC: 32 g/dL (ref 30.0–36.0)
MCV: 87.1 fL (ref 80.0–100.0)
Monocytes Absolute: 0.7 10*3/uL (ref 0.1–1.0)
Monocytes Relative: 6 %
Neutro Abs: 7.9 10*3/uL — ABNORMAL HIGH (ref 1.7–7.7)
Neutrophils Relative %: 70 %
Platelets: 189 10*3/uL (ref 150–400)
RBC: 3.09 MIL/uL — ABNORMAL LOW (ref 3.87–5.11)
RDW: 13.2 % (ref 11.5–15.5)
WBC: 11.2 10*3/uL — ABNORMAL HIGH (ref 4.0–10.5)
nRBC: 0.2 % (ref 0.0–0.2)

## 2019-06-13 LAB — GLUCOSE, CAPILLARY
Glucose-Capillary: 101 mg/dL — ABNORMAL HIGH (ref 70–99)
Glucose-Capillary: 116 mg/dL — ABNORMAL HIGH (ref 70–99)
Glucose-Capillary: 131 mg/dL — ABNORMAL HIGH (ref 70–99)
Glucose-Capillary: 137 mg/dL — ABNORMAL HIGH (ref 70–99)
Glucose-Capillary: 138 mg/dL — ABNORMAL HIGH (ref 70–99)
Glucose-Capillary: 96 mg/dL (ref 70–99)

## 2019-06-13 LAB — BASIC METABOLIC PANEL
Anion gap: 10 (ref 5–15)
BUN: 20 mg/dL (ref 8–23)
CO2: 24 mmol/L (ref 22–32)
Calcium: 8.3 mg/dL — ABNORMAL LOW (ref 8.9–10.3)
Chloride: 100 mmol/L (ref 98–111)
Creatinine, Ser: 1.12 mg/dL — ABNORMAL HIGH (ref 0.44–1.00)
GFR calc Af Amer: >60 mL/min (ref >60)
GFR calc non Af Amer: 53 mL/min — ABNORMAL LOW (ref >60)
Glucose, Bld: 142 mg/dL — ABNORMAL HIGH (ref 70–99)
Potassium: 3.7 mmol/L (ref 3.5–5.1)
Sodium: 134 mmol/L — ABNORMAL LOW (ref 135–145)

## 2019-06-13 MED ORDER — DRONABINOL 2.5 MG PO CAPS
5.0000 mg | ORAL_CAPSULE | Freq: Two times a day (BID) | ORAL | Status: DC
  Administered 2019-06-13 – 2019-06-16 (×7): 5 mg via ORAL
  Filled 2019-06-13 (×7): qty 2

## 2019-06-13 MED ORDER — METOPROLOL TARTRATE 25 MG PO TABS
37.5000 mg | ORAL_TABLET | Freq: Two times a day (BID) | ORAL | Status: DC
  Administered 2019-06-13 – 2019-06-15 (×5): 37.5 mg via ORAL
  Filled 2019-06-13 (×5): qty 1

## 2019-06-13 MED ORDER — POTASSIUM CHLORIDE CRYS ER 20 MEQ PO TBCR
20.0000 meq | EXTENDED_RELEASE_TABLET | ORAL | Status: AC
  Administered 2019-06-13 (×3): 20 meq via ORAL
  Filled 2019-06-13 (×3): qty 1

## 2019-06-13 MED ORDER — METOPROLOL TARTRATE 25 MG/10 ML ORAL SUSPENSION: 12.5000 mg | Freq: Two times a day (BID) | ORAL | Status: DC

## 2019-06-13 NOTE — Progress Notes (Signed)
      [address]       [place] [address]             [phone]                 3 Days Post-Op Procedure(s) (LRB): CORONARY ARTERY BYPASS GRAFTING (CABG) x Four, using left internal mammary artery and right leg greater saphenous vein harvested endoscopically (N/A) TRANSESOPHAGEAL ECHOCARDIOGRAM (TEE) (N/A)   Events: Poor appetite.  Remains week _______________________________________________________________ Vitals: BP 128/61   Pulse (!) 105   Temp 97.9 F (36.6 C) (Oral)   Resp (!) 22   Ht 5' (1.524 m)   Wt 88.3 kg   SpO2 99%   BMI 38.02 kg/m   - Neuro: alert NAD,   - Cardiovascular: sinus tach     - Pulm: ewob, clear    ABG    Component Value Date/Time   PHART 7.312 (L) 06/10/2019 1947   PCO2ART 45.9 06/10/2019 1947   PO2ART 107.0 06/10/2019 1947   HCO3 23.1 06/10/2019 1947   TCO2 22 06/10/2019 2014   ACIDBASEDEF 3.0 (H) 06/10/2019 1947   O2SAT 97.0 06/10/2019 1947    - Abd: soft NT/ND - Extremity: no peripheral edema  .Intake/Output      08/07 0701 - 08/08 0700 08/08 0701 - 08/09 0700   P.O. 350    I.V. (mL/kg) 928 (10.5)    IV Piggyback     Total Intake(mL/kg) 1278 (14.5)    Urine (mL/kg/hr) 820 (0.4)    Chest Tube     Total Output 820    Net +458         Urine Occurrence 2 x       _______________________________________________________________ Labs: CBC Latest Ref Rng & Units 06/13/2019 06/12/2019 06/11/2019  WBC 4.0 - 10.5 K/uL 11.2(H) 12.5(H) 12.0(H)  Hemoglobin 12.0 - 15.0 g/dL 8.6(L) 9.3(L) 9.2(L)  Hematocrit 36.0 - 46.0 % 26.9(L) 29.7(L) 29.4(L)  Platelets 150 - 400 K/uL 189 191 172   CMP Latest Ref Rng & Units 06/13/2019 06/12/2019 06/12/2019  Glucose 70 - 99 mg/dL 142(H) 120(H) 119(H)  BUN 8 - 23 mg/dL 20 21 18   Creatinine 0.44 - 1.00 mg/dL 1.12(H) 1.27(H) 1.80(H)  Sodium 135 - 145 mmol/L 134(L) 132(L) 134(L)  Potassium 3.5 - 5.1 mmol/L 3.7 3.8 4.4  Chloride 98 - 111 mmol/L 100 101 101  CO2 22 - 32 mmol/L 24 24 25    Calcium 8.9 - 10.3 mg/dL 8.3(L) 8.2(L) 8.7(L)  Total Protein 6.5 - 8.1 g/dL - - -  Total Bilirubin 0.3 - 1.2 mg/dL - - -  Alkaline Phos 38 - 126 U/L - - -  AST 15 - 41 U/L - - -  ALT 0 - 44 U/L - - -    CXR:  None today _______________________________________________________________  Assessment and Plan: POD 3  s/p CABG.    Neuro: pain controlled CV: stable, on aspirin statin, BB.  Increasing BB due to tachycardia Pulm: pulm toilet Renal: creat trending down GI: poor appetite.  Added marinol.  Family will bring in food Heme: stable ID: afebrile  Endo: on SSI Dispo: will transfer to floor today  Melodie Bouillon, MD 06/13/2019 8:55 AM

## 2019-06-14 ENCOUNTER — Encounter (HOSPITAL_COMMUNITY): Payer: Self-pay

## 2019-06-14 LAB — GLUCOSE, CAPILLARY
Glucose-Capillary: 112 mg/dL — ABNORMAL HIGH (ref 70–99)
Glucose-Capillary: 112 mg/dL — ABNORMAL HIGH (ref 70–99)
Glucose-Capillary: 116 mg/dL — ABNORMAL HIGH (ref 70–99)
Glucose-Capillary: 134 mg/dL — ABNORMAL HIGH (ref 70–99)
Glucose-Capillary: 76 mg/dL (ref 70–99)
Glucose-Capillary: 85 mg/dL (ref 70–99)

## 2019-06-14 LAB — BASIC METABOLIC PANEL
Anion gap: 10 (ref 5–15)
BUN: 23 mg/dL (ref 8–23)
CO2: 24 mmol/L (ref 22–32)
Calcium: 8.8 mg/dL — ABNORMAL LOW (ref 8.9–10.3)
Chloride: 103 mmol/L (ref 98–111)
Creatinine, Ser: 0.95 mg/dL (ref 0.44–1.00)
GFR calc Af Amer: >60 mL/min (ref >60)
GFR calc non Af Amer: >60 mL/min (ref >60)
Glucose, Bld: 131 mg/dL — ABNORMAL HIGH (ref 70–99)
Potassium: 4.6 mmol/L (ref 3.5–5.1)
Sodium: 137 mmol/L (ref 135–145)

## 2019-06-14 LAB — CBC
HCT: 26.2 % — ABNORMAL LOW (ref 36.0–46.0)
Hemoglobin: 8.3 g/dL — ABNORMAL LOW (ref 12.0–15.0)
MCH: 27.8 pg (ref 26.0–34.0)
MCHC: 31.7 g/dL (ref 30.0–36.0)
MCV: 87.6 fL (ref 80.0–100.0)
Platelets: 233 10*3/uL (ref 150–400)
RBC: 2.99 MIL/uL — ABNORMAL LOW (ref 3.87–5.11)
RDW: 13.2 % (ref 11.5–15.5)
WBC: 10.5 10*3/uL (ref 4.0–10.5)
nRBC: 0.5 % — ABNORMAL HIGH (ref 0.0–0.2)

## 2019-06-14 MED ORDER — INSULIN ASPART 100 UNIT/ML ~~LOC~~ SOLN
0.0000 [IU] | Freq: Three times a day (TID) | SUBCUTANEOUS | Status: DC
  Administered 2019-06-15 (×3): 2 [IU] via SUBCUTANEOUS
  Administered 2019-06-16: 4 [IU] via SUBCUTANEOUS

## 2019-06-14 MED ORDER — INSULIN ASPART 100 UNIT/ML ~~LOC~~ SOLN: 0.0000 [IU] | Freq: Three times a day (TID) | SUBCUTANEOUS | Status: DC

## 2019-06-14 MED ORDER — CHLORHEXIDINE GLUCONATE CLOTH 2 % EX PADS
6.0000 | MEDICATED_PAD | Freq: Every day | CUTANEOUS | Status: DC
  Administered 2019-06-14: 09:00:00 6 via TOPICAL

## 2019-06-14 NOTE — Progress Notes (Signed)
      [address]       [place], [address]             [phone]                 4 Days Post-Op Procedure(s) (LRB): CORONARY ARTERY BYPASS GRAFTING (CABG) x Four, using left internal mammary artery and right leg greater saphenous vein harvested endoscopically (N/A) TRANSESOPHAGEAL ECHOCARDIOGRAM (TEE) (N/A)   Events: No events _______________________________________________________________ Vitals: BP 130/78   Pulse 85   Temp 98.3 F (36.8 C) (Oral)   Resp 14   Ht 5' (1.524 m)   Wt 93.4 kg   SpO2 100%   BMI 40.21 kg/m   - Neuro: alert NAD,   - Cardiovascular: sinus      - Pulm: ewob, clear     - Abd: soft NT/ND - Extremity: no peripheral edema  .Intake/Output      08/08 0701 - 08/09 0700 08/09 0701 - 08/10 0700   P.O. 960    I.V. (mL/kg) 3 (0)    Total Intake(mL/kg) 963 (10.3)    Urine (mL/kg/hr) 400 (0.2)    Total Output 400    Net +563         Urine Occurrence 1 x       _______________________________________________________________ Labs: CBC Latest Ref Rng & Units 06/14/2019 06/13/2019 06/12/2019  WBC 4.0 - 10.5 K/uL 10.5 11.2(H) 12.5(H)  Hemoglobin 12.0 - 15.0 g/dL 8.3(L) 8.6(L) 9.3(L)  Hematocrit 36.0 - 46.0 % 26.2(L) 26.9(L) 29.7(L)  Platelets 150 - 400 K/uL 233 189 191   CMP Latest Ref Rng & Units 06/14/2019 06/13/2019 06/12/2019  Glucose 70 - 99 mg/dL 131(H) 142(H) 120(H)  BUN 8 - 23 mg/dL 23 20 21   Creatinine 0.44 - 1.00 mg/dL 0.95 1.12(H) 1.27(H)  Sodium 135 - 145 mmol/L 137 134(L) 132(L)  Potassium 3.5 - 5.1 mmol/L 4.6 3.7 3.8  Chloride 98 - 111 mmol/L 103 100 101  CO2 22 - 32 mmol/L 24 24 24   Calcium 8.9 - 10.3 mg/dL 8.8(L) 8.3(L) 8.2(L)  Total Protein 6.5 - 8.1 g/dL - - -  Total Bilirubin 0.3 - 1.2 mg/dL - - -  Alkaline Phos 38 - 126 U/L - - -  AST 15 - 41 U/L - - -  ALT 0 - 44 U/L - - -    CXR:  None today _______________________________________________________________  Assessment and Plan: POD 4  s/p CABG.     Neuro: pain controlled CV: stable, on aspirin statin, BB.  HR improved Pulm: pulm toilet Renal: creat trending down GI: improved appetite on Marinol  Heme: stable ID: afebrile  Endo: on SSI Dispo: will transfer to floor today  Melodie Bouillon, MD 06/14/2019 9:03 AM

## 2019-06-15 LAB — GLUCOSE, CAPILLARY
Glucose-Capillary: 129 mg/dL — ABNORMAL HIGH (ref 70–99)
Glucose-Capillary: 125 mg/dL — ABNORMAL HIGH (ref 70–99)
Glucose-Capillary: 137 mg/dL — ABNORMAL HIGH (ref 70–99)
Glucose-Capillary: 105 mg/dL — ABNORMAL HIGH (ref 70–99)

## 2019-06-15 MED ORDER — METOPROLOL TARTRATE 50 MG PO TABS
50.0000 mg | ORAL_TABLET | Freq: Two times a day (BID) | ORAL | Status: DC
  Administered 2019-06-15 – 2019-06-16 (×2): 50 mg via ORAL
  Filled 2019-06-15 (×2): qty 1

## 2019-06-15 NOTE — Progress Notes (Signed)
Pt assessment and meds given using visual cues. Tried to access video and audio interpreter for [place] but no one came on the line. Tried to call daughter but no one answered the phone. Will continue to monitor. Pt is resting comfortably. Lajoyce Corners, RN

## 2019-06-15 NOTE — Progress Notes (Signed)
[employer] Interpreter services uses when assessing, giving medication, and educating pt. All questions, needs, and concerns addressed.

## 2019-06-15 NOTE — Progress Notes (Signed)
[address]       [place] [address]             [phone]      5 Days Post-Op Procedure(s) (LRB): CORONARY ARTERY BYPASS GRAFTING (CABG) x Four, using left internal mammary artery and right leg greater saphenous vein harvested endoscopically (N/A) TRANSESOPHAGEAL ECHOCARDIOGRAM (TEE) (N/A) Subjective: No apparent C/O  Objective: Vital signs in last 24 hours: Temp:  [98.1 F (36.7 C)-99.2 F (37.3 C)] 98.4 F (36.9 C) (08/10 0623) Pulse Rate:  [84-115] 92 (08/10 0623) Cardiac Rhythm: Normal sinus rhythm (08/09 2100) Resp:  [14-22] 21 (08/10 0623) BP: (124-151)/(69-89) 151/82 (08/10 0623) SpO2:  [94 %-100 %] 100 % (08/10 0623)  Hemodynamic parameters for last 24 hours:    Intake/Output from previous day: 08/09 0701 - 08/10 0700 In: 960 [P.O.:960] Out: -  Intake/Output this shift: No intake/output data recorded.  General appearance: alert, cooperative and no distress Heart: regular rate and rhythm Lungs: mildly dim in bases Abdomen: soft, nontender Extremities: minor edema Wound: incis healing well  Lab Results: Recent Labs    06/13/19 0439 06/14/19 0258  WBC 11.2* 10.5  HGB 8.6* 8.3*  HCT 26.9* 26.2*  PLT 189 233   BMET:  Recent Labs    06/13/19 0439 06/14/19 0258  NA 134* 137  K 3.7 4.6  CL 100 103  CO2 24 24  GLUCOSE 142* 131*  BUN 20 23  CREATININE 1.12* 0.95  CALCIUM 8.3* 8.8*    PT/INR: No results for input(s): LABPROT, INR in the last 72 hours. ABG    Component Value Date/Time   PHART 7.312 (L) 06/10/2019 1947   HCO3 23.1 06/10/2019 1947   TCO2 22 06/10/2019 2014   ACIDBASEDEF 3.0 (H) 06/10/2019 1947   O2SAT 97.0 06/10/2019 1947   CBG (last 3)  Recent Labs    06/14/19 1623 06/14/19 2141 06/15/19 0621  GLUCAP 116* 85 129*    Meds Scheduled Meds: . acetaminophen  1,000 mg Oral Q6H   Or  . acetaminophen (TYLENOL) oral liquid 160 mg/5 mL  1,000 mg Per Tube Q6H  . aspirin EC  81 mg Oral Daily  .  atorvastatin  80 mg Oral q1800  . bisacodyl  10 mg Oral Daily   Or  . bisacodyl  10 mg Rectal Daily  . Chlorhexidine Gluconate Cloth  6 each Topical Daily  . clopidogrel  75 mg Oral Daily  . docusate sodium  200 mg Oral Daily  . dronabinol  5 mg Oral BID AC  . enoxaparin (LOVENOX) injection  40 mg Subcutaneous QHS  . insulin aspart  0-24 Units Subcutaneous TID AC & HS  . metoprolol tartrate  37.5 mg Oral BID  . pantoprazole  40 mg Oral Daily  . sodium chloride flush  3 mL Intravenous Q12H   Continuous Infusions: . sodium chloride Stopped (06/11/19 0835)  . sodium chloride    . sodium chloride 20 mL/hr at 06/10/19 1439  . lactated ringers    . lactated ringers 10 mL/hr at 06/12/19 0600  . lactated ringers 50 mL/hr at 06/12/19 0746   PRN Meds:.sodium chloride, lactated ringers, metoprolol tartrate, ondansetron (ZOFRAN) IV, sodium chloride flush, traMADol  Xrays Dg Chest Port 1 View  Result Date: 06/13/2019 CLINICAL DATA:  63 year old female with a history of atelectasis EXAM: PORTABLE CHEST 1 VIEW COMPARISON:  June 12, 2019 FINDINGS: Cardiomediastinal silhouette unchanged in size and contour with changes of median sternotomy and CABG. Interval  removal of mediastinal/pleural drains. Tiny residual left apical pneumothorax, improved from prior. Right IJ sheath remains. Epicardial pacing leads remain. Patchy opacities bilaterally with blunting of the bilateral costophrenic angles. IMPRESSION: Interval removal of mediastinal/pleural drains, with resolving left pneumothorax. Unchanged right IJ sheath and epicardial pacing leads. Low lung volumes persist with basilar atelectasis and trace bilateral pleural effusions. Median sternotomy and CABG Electronically Signed   By: Gilmer MorJaime  Wagner D.O.   On: 06/13/2019 11:02    Assessment/Plan: S/P Procedure(s) (LRB): CORONARY ARTERY BYPASS GRAFTING (CABG) x Four, using left internal mammary artery and right leg greater saphenous vein harvested  endoscopically (N/A) TRANSESOPHAGEAL ECHOCARDIOGRAM (TEE) (N/A)  1 hemodyn stable in sinus rhythm. BP mostly controlled with occas elev systolic 2 sats good on RA 3 BS adeq controlled 4 no new labs or CXR 5 cont pulm toilet/rehab- poss home in am 6 CM consult for home needs  LOS: 7 days    Rowe ClackWayne E Janece Laidlaw PA-C 06/15/2019 Pager [phone]

## 2019-06-15 NOTE — Evaluation (Addendum)
Occupational Therapy Evaluation Patient Details Name: Kimberly Bryant MRN: [id number] DOB: 02-Jan-1956 Today's Date: 06/15/2019    History of Present Illness Pt is a 63 y/o female presenting to ED on 8/2 with angioedema, workup revealed NSTEMI. S/P CABGx 4 and TEE 06/10/19. PMH: CAD, HTN.    Clinical Impression   PTA patient reports independent with ADLs, using RW for mobility.  Utilized [employer] interpreter during session. Admitted for above and limited by problem list below, including generalized weakness, decreased activity tolerance, impaired balance, adherence to sternal precautions.  Patient educated on precautions, mobility, safety and ADL compensatory techniques, requires constant cueing during session to adhere to precautions.  She reports living at home with family, who are able to support her.  She demonstrates ability to complete transfers with min guard, in room mobility with min guard using RW, UB ADLs with min assist and LB ADLs with mod assist.  She will benefit from continued OT services while admitted and after dc at [facility] level in order to maximize independence and safety with ADLs, mobility and precaution adherence.    VSS: HR 94-113, BP 144/81, SPo2 on RA 95%   Follow Up Recommendations  Home health OT;Supervision/Assistance - 24 hour    Equipment Recommendations  3 in 1 bedside commode    Recommendations for Other Services       Precautions / Restrictions Precautions Precautions: Fall;Sternal Precaution Comments: reviewed precautions with pt  Restrictions Weight Bearing Restrictions: Yes Other Position/Activity Restrictions: sternal precautions       Mobility Bed Mobility               General bed mobility comments: OOB in recliner upon entry  Transfers Overall transfer level: Needs assistance Equipment used: Rolling walker (2 wheeled) Transfers: Sit to/from Stand Sit to Stand: Min guard         General transfer comment: min guard for safety and  balance, cueing for hand placement and adherance to sternal precuations     Balance Overall balance assessment: Needs assistance Sitting-balance support: No upper extremity supported;Feet supported Sitting balance-Leahy Scale: Good     Standing balance support: Bilateral upper extremity supported;During functional activity Standing balance-Leahy Scale: Fair Standing balance comment: reliant on BUE support dynamically                           ADL either performed or assessed with clinical judgement   ADL Overall ADL's : Needs assistance/impaired     Grooming: Minimal assistance;Standing Grooming Details (indicate cue type and reason): cueing for sternal precautions  Upper Body Bathing: Minimal assistance;Sitting   Lower Body Bathing: Moderate assistance;Sit to/from stand   Upper Body Dressing : Minimal assistance;Sitting   Lower Body Dressing: Moderate assistance;Sit to/from stand Lower Body Dressing Details (indicate cue type and reason): difficulty reaching to manage socks, min guard sit<>Stand  Toilet Transfer: Nature conservation officer;Ambulation;RW Toilet Transfer Details (indicate cue type and reason): cueing for sternal precautions and technique  Toileting- Clothing Manipulation and Hygiene: Minimal assistance;Sit to/from stand Toileting - Clothing Manipulation Details (indicate cue type and reason): able to reach peri area, but anticipate difficulty after BM      Functional mobility during ADLs: Min guard;Rolling walker;Cueing for safety General ADL Comments: pt limited by sternal precautions and adherance during functional tasks, decreased activity tolerance and endurance       Vision         Perception     Praxis      Pertinent  Vitals/Pain Pain Assessment: Faces Faces Pain Scale: Hurts little more Pain Location: incisional, chest Pain Descriptors / Indicators: Discomfort;Operative site guarding Pain Intervention(s): Monitored during  session;Repositioned     Hand Dominance     Extremity/Trunk Assessment Upper Extremity Assessment Upper Extremity Assessment: Generalized weakness(B UE edema )   Lower Extremity Assessment Lower Extremity Assessment: Defer to PT evaluation       Communication Communication Communication: No difficulties   Cognition Arousal/Alertness: Awake/alert Behavior During Therapy: Flat affect Overall Cognitive Status: Difficult to assess                                 General Comments: pt able to follow commands, poor recall of sternal precautions with constant cueing during session    General Comments  VSS,  HR 94-113 BP 144/81, SpO2 on RA 95%; utilizer [employer] interpreter: Adron BeneMariLaure [phone]    Exercises     Shoulder Instructions      Home Living Family/patient expects to be discharged to:: Private residence Living Arrangements: Spouse/significant other;Other (Comment)(niece) Available Help at Discharge: Family Type of Home: House Home Access: Stairs to enter     Home Layout: One level     Bathroom Shower/Tub: Chief Strategy OfficerTub/shower unit   Bathroom Toilet: Standard     Home Equipment: Environmental consultantWalker - 2 wheels          Prior Functioning/Environment Level of Independence: Independent with assistive device(s)        Comments: uses walker for mobility, independent ADLS        OT Problem List: Decreased strength;Decreased activity tolerance;Impaired balance (sitting and/or standing);Pain;Cardiopulmonary status limiting activity;Decreased knowledge of precautions;Decreased knowledge of use of DME or AE;Decreased safety awareness;Obesity;Increased edema      OT Treatment/Interventions: Self-care/ADL training;Energy conservation;DME and/or AE instruction;Therapeutic activities;Balance training;Patient/family education;Therapeutic exercise    OT Goals(Current goals can be found in the care plan section) Acute Rehab OT Goals Patient Stated Goal: to feel better OT Goal  Formulation: With patient Time For Goal Achievement: 06/29/19 Potential to Achieve Goals: Good  OT Frequency: Min 2X/week   Barriers to D/C:            Co-evaluation              AM-PAC OT "6 Clicks" Daily Activity     Outcome Measure Help from another person eating meals?: A Little Help from another person taking care of personal grooming?: A Little Help from another person toileting, which includes using toliet, bedpan, or urinal?: A Little Help from another person bathing (including washing, rinsing, drying)?: A Lot Help from another person to put on and taking off regular upper body clothing?: A Little Help from another person to put on and taking off regular lower body clothing?: A Lot 6 Click Score: 16   End of Session Equipment Utilized During Treatment: Gait belt;Rolling walker Nurse Communication: Mobility status  Activity Tolerance: Patient tolerated treatment well Patient left: in chair;with call bell/phone within reach  OT Visit Diagnosis: Other abnormalities of gait and mobility (R26.89);Pain;Muscle weakness (generalized) (M62.81) Pain - part of body: (incisional)                Time: [id number] OT Time Calculation (min): 28 min Charges:  OT General Charges $OT Visit: 1 Visit OT Evaluation $OT Eval Moderate Complexity: 1 Mod OT Treatments $Self Care/Home Management : 8-22 mins  Chancy Milroyhristie S Isaid Salvia, OT Acute Rehabilitation Services Pager [phone] Office [phone]    Nadean Corwinhristie S  Leahmarie Gasiorowski 06/15/2019, 8:59 AM

## 2019-06-15 NOTE — Evaluation (Signed)
Physical Therapy Evaluation Patient Details Name: Kimberly Bryant MRN: [id number] DOB: 12/26/1955 Today's Date: 06/15/2019   History of Present Illness  Pt is a 63 y/o female presenting to ED on 8/2 with angioedema, workup revealed NSTEMI. S/P CABGx 4 and TEE 06/10/19. PMH: CAD, HTN.   Clinical Impression  Pt admitted with above diagnosis. Pt currently with functional limitations due to the deficits listed below (see PT Problem List). Pt was able to ambulate with min guard assist in hallway with RW.  Needs cues for sternal precautions. Anticipate good progress.   Pt will benefit from skilled PT to increase their independence and safety with mobility to allow discharge to the venue listed below.      Follow Up Recommendations Home health PT;Supervision - Intermittent    Equipment Recommendations  None recommended by PT    Recommendations for Other Services       Precautions / Restrictions Precautions Precautions: Fall;Sternal Precaution Booklet Issued: Yes (comment) Precaution Comments: reviewed precautions with pt  Restrictions Weight Bearing Restrictions: Yes(sternal precautions)      Mobility  Bed Mobility               General bed mobility comments: OOB in recliner upon entry  Transfers Overall transfer level: Needs assistance Equipment used: Rolling walker (2 wheeled) Transfers: Sit to/from Stand Sit to Stand: Min guard         General transfer comment: min guard for safety and balance, cueing for hand placement and adherance to sternal precuations   Ambulation/Gait Ambulation/Gait assistance: Min guard;Min assist Gait Distance (Feet): 150 Feet Assistive device: Rolling walker (2 wheeled) Gait Pattern/deviations: Step-through pattern;Decreased stride length;Drifts right/left   Gait velocity interpretation: <1.31 ft/sec, indicative of household ambulator General Gait Details: Pt tends to drift to left.  Needed cues for keeping in proximity to RW.  Pt also had to  take stnding rest breaks as she would have DOE 3/4 per pt. She reports she cant breathe as well with mask but understands it is needed in hallway.    Stairs            Wheelchair Mobility    Modified Rankin (Stroke Patients Only)       Balance Overall balance assessment: Needs assistance Sitting-balance support: No upper extremity supported;Feet supported Sitting balance-Leahy Scale: Good     Standing balance support: Bilateral upper extremity supported;During functional activity Standing balance-Leahy Scale: Poor Standing balance comment: reliant on BUE support dynamically                             Pertinent Vitals/Pain Pain Assessment: Faces Faces Pain Scale: Hurts little more Pain Location: incisional, chest Pain Descriptors / Indicators: Discomfort;Operative site guarding Pain Intervention(s): Limited activity within patient's tolerance;Monitored during session;Repositioned    Home Living Family/patient expects to be discharged to:: Private residence Living Arrangements: Spouse/significant other;Other (Comment)(niece) Available Help at Discharge: Family;Available PRN/intermittently(family works per pt) Type of Home: House Home Access: Stairs to enter   Enter[employer] CorporationEntrance Stairs-Number of Steps: 4 Home Layout: One level Home Equipment: Environmental consultantWalker - 2 wheels Additional Comments: Pt told interpreter that she works and her family works.     Prior Function Level of Independence: Independent with assistive device(s)         Comments: uses walker for mobility, independent ADLS     Hand Dominance        Extremity/Trunk Assessment   Upper Extremity Assessment Upper Extremity Assessment: Defer to OT evaluation  Lower Extremity Assessment Lower Extremity Assessment: Generalized weakness    Cervical / Trunk Assessment Cervical / Trunk Assessment: Normal  Communication   Communication: Prefers language other than [place];Other (comment)([place] - used  interpreter [id number])  Cognition Arousal/Alertness: Awake/alert Behavior During Therapy: Flat affect Overall Cognitive Status: Difficult to assess                                 General Comments: pt able to follow commands, poor recall of sternal precautions with constant cueing during session       General Comments General comments (skin integrity, edema, etc.): VSSwith pulse ox 91% on RA with ambulation.  Assisted pt with her lunch set up on return to room.     Exercises     Assessment/Plan    PT Assessment Patient needs continued PT services  PT Problem List Decreased activity tolerance;Decreased balance;Decreased mobility;Decreased knowledge of use of DME;Decreased safety awareness;Decreased knowledge of precautions;Cardiopulmonary status limiting activity       PT Treatment Interventions DME instruction;Gait training;Functional mobility training;Therapeutic activities;Therapeutic exercise;Balance training;Stair training;Patient/family education    PT Goals (Current goals can be found in the Care Plan section)  Acute Rehab PT Goals Patient Stated Goal: to feel better PT Goal Formulation: With patient Time For Goal Achievement: 06/29/19 Potential to Achieve Goals: Good    Frequency Min 3X/week   Barriers to discharge Decreased caregiver support      Co-evaluation               AM-PAC PT "6 Clicks" Mobility  Outcome Measure Help needed turning from your back to your side while in a flat bed without using bedrails?: A Little Help needed moving from lying on your back to sitting on the side of a flat bed without using bedrails?: A Little Help needed moving to and from a bed to a chair (including a wheelchair)?: A Little Help needed standing up from a chair using your arms (e.g., wheelchair or bedside chair)?: A Little Help needed to walk in hospital room?: A Little Help needed climbing 3-5 steps with a railing? : A Little 6 Click Score: 18    End  of Session Equipment Utilized During Treatment: Gait belt Activity Tolerance: Patient limited by fatigue Patient left: in chair;with call bell/phone within reach Nurse Communication: Mobility status PT Visit Diagnosis: Muscle weakness (generalized) (M62.81);Other abnormalities of gait and mobility (R26.89)    Time: 7062-3762 PT Time Calculation (min) (ACUTE ONLY): 18 min   Charges:   PT Evaluation $PT Eval Moderate Complexity: [place] Pager:  [phone]  Office:  [place] 06/15/2019, 1:38 PM

## 2019-06-15 NOTE — Telephone Encounter (Signed)
The pt is currently in the hospital. We will monitor her chart and call once discharged. 

## 2019-06-15 NOTE — Progress Notes (Addendum)
Progress Note  Patient Name: Kimberly Bryant Date of Encounter: 06/15/2019  Primary Cardiologist: Armanda Magicraci Turner, MD   Subjective   POD# 5. S/p CABG x 6. No complaints. Denies CP.   Inpatient Medications    Scheduled Meds:  acetaminophen  1,000 mg Oral Q6H   Or   acetaminophen (TYLENOL) oral liquid 160 mg/5 mL  1,000 mg Per Tube Q6H   aspirin EC  81 mg Oral Daily   atorvastatin  80 mg Oral q1800   bisacodyl  10 mg Oral Daily   Or   bisacodyl  10 mg Rectal Daily   Chlorhexidine Gluconate Cloth  6 each Topical Daily   clopidogrel  75 mg Oral Daily   docusate sodium  200 mg Oral Daily   dronabinol  5 mg Oral BID AC   enoxaparin (LOVENOX) injection  40 mg Subcutaneous QHS   insulin aspart  0-24 Units Subcutaneous TID AC & HS   metoprolol tartrate  37.5 mg Oral BID   pantoprazole  40 mg Oral Daily   sodium chloride flush  3 mL Intravenous Q12H   Continuous Infusions:  sodium chloride Stopped (06/11/19 0835)   sodium chloride     sodium chloride 20 mL/hr at 06/10/19 1439   lactated ringers     lactated ringers 10 mL/hr at 06/12/19 0600   lactated ringers 50 mL/hr at 06/12/19 0746   PRN Meds: sodium chloride, lactated ringers, metoprolol tartrate, ondansetron (ZOFRAN) IV, sodium chloride flush, traMADol   Vital Signs    Vitals:   06/14/19 2212 06/15/19 0107 06/15/19 0623 06/15/19 0850  BP: 126/89  (!) 151/82 (!) 144/87  Pulse: 99 84 92 (!) 104  Resp: 16 (!) 22 (!) 21 (!) 21  Temp: 99.2 F (37.3 C) 98.7 F (37.1 C) 98.4 F (36.9 C)   TempSrc: Oral Oral Oral   SpO2: 95% 99% 100% 94%  Weight:      Height:        Intake/Output Summary (Last 24 hours) at 06/15/2019 1037 Last data filed at 06/15/2019 0900 Gross per 24 hour  Intake 630 ml  Output --  Net 630 ml   Last 3 Weights 06/14/2019 06/13/2019 06/12/2019  Weight (lbs) 205 lb 14.6 oz 194 lb 10.7 oz 195 lb 8.8 oz  Weight (kg) 93.4 kg 88.3 kg 88.7 kg      Telemetry    NSR 90s  - Personally  Reviewed  ECG    Not performed today - Personally Reviewed  Physical Exam   GEN: No acute distress.   Neck: No JVD Cardiac: RRR, no murmurs, rubs, or gallops.  Respiratory: Clear to auscultation bilaterally. GI: Soft, nontender, non-distended  MS: trace bilateral LE edema; No deformity. Neuro:  Nonfocal  Psych: Normal affect   Labs    High Sensitivity Troponin:   Recent Labs  Lab 06/08/19 1343 06/08/19 1630 06/08/19 1930 06/08/19 2110  TROPONINIHS 221* 893* 6,097* 7,850*      Cardiac EnzymesNo results for input(s): TROPONINI in the last 168 hours. No results for input(s): TROPIPOC in the last 168 hours.   Chemistry Recent Labs  Lab 06/08/19 1343  06/12/19 1841 06/13/19 0439 06/14/19 0258  NA 140   < > 132* 134* 137  K 3.9   < > 3.8 3.7 4.6  CL 106   < > 101 100 103  CO2 21*   < > 24 24 24   GLUCOSE 243*   < > 120* 142* 131*  BUN 17   < >  21 20 23   CREATININE 1.15*   < > 1.27* 1.12* 0.95  CALCIUM 9.4   < > 8.2* 8.3* 8.8*  PROT 7.7  --   --   --   --   ALBUMIN 4.0  --   --   --   --   AST 24  --   --   --   --   ALT 22  --   --   --   --   ALKPHOS 65  --   --   --   --   BILITOT 0.3  --   --   --   --   GFRNONAA 51*   < > 45* 53* >60  GFRAA 59*   < > 52* >60 >60  ANIONGAP 13   < > 7 10 10    < > = values in this interval not displayed.     Hematology Recent Labs  Lab 06/12/19 0335 06/13/19 0439 06/14/19 0258  WBC 12.5* 11.2* 10.5  RBC 3.31* 3.09* 2.99*  HGB 9.3* 8.6* 8.3*  HCT 29.7* 26.9* 26.2*  MCV 89.7 87.1 87.6  MCH 28.1 27.8 27.8  MCHC 31.3 32.0 31.7  RDW 13.1 13.2 13.2  PLT 191 189 233   Lipid Panel     Component Value Date/Time   CHOL 177 12/10/2018 1117   TRIG 68 12/10/2018 1117   HDL 54 12/10/2018 1117   CHOLHDL 3.3 12/10/2018 1117   LDLCALC 109 (H) 12/10/2018 1117    BNPNo results for input(s): BNP, PROBNP in the last 168 hours.   DDimer No results for input(s): DDIMER in the last 168 hours.   Radiology    No results  found.  Cardiac Studies   LHC 06/09/19 Conclusion    2nd Mrg lesion is 100% stenosed.  Prox Cx to Mid Cx lesion is 90% stenosed.  Prox LAD lesion is 60% stenosed.  Prox LAD to Mid LAD lesion is 95% stenosed.  Mid LAD lesion is 90% stenosed.  Dist LAD lesion is 90% stenosed.  Prox RCA to Mid RCA lesion is 100% stenosed.  Mid RCA to Dist RCA lesion is 100% stenosed.   Severe three-vessel coronary obstructive disease with 60 and 95% proximal LAD stenoses, 90% mid and diffuse 90% distal LAD stenoses; total recent occlusion of the second circumflex marginal vessel of the circumflex with contrast hang-up and 90% stenosis in the AV groove circumflex just after this marginal takeoff; and total mid RCA occlusion with possible small bridging collaterals with total distal RCA occlusion with collateralization distally via the proximal marginal branch and left circulation.  LVEDP 16 mm.  Left ventriculography was not done, but the echo report which came back during the procedure showed EF greater than 65%.  RECOMMENDATION: Surgical consultation for CABG revascularization surgery.    2D Echo 06/09/19 IMPRESSIONS    1. The left ventricle has hyperdynamic systolic function, with an ejection fraction of >65%. The cavity size was normal. There is severely increased left ventricular wall thickness. Left ventricular diastolic Doppler parameters are consistent with  pseudonormalization. Elevated mean left atrial pressure No evidence of left ventricular regional wall motion abnormalities.  2. The right ventricle has normal systolic function. The cavity was normal. There is no increase in right ventricular wall thickness. Right ventricular systolic pressure is mildly elevated with an estimated pressure of 32.1 mmHg.  3. Left atrial size was moderately dilated.  4. Right atrial size was mildly dilated.  5. Mild thickening of the mitral  valve leaflet. Mitral valve regurgitation is moderate by color  flow Doppler.  6. The aorta is normal in size and structure.  7. The aortic root is normal in size and structure.  8. The inferior vena cava was dilated in size with <50% respiratory variability.  Patient Profile     63 y.o. female h/o HTN, HLD, GERD and angioedema w/ ACEi, admitted for CP and ruled in for NSTEMI (max hs Trop 7,850). She was found to have 3V CAD and was referred for CABG.    Assessment & Plan    1. CAD: ruled in for NSTEMI. Severe 3 vessel CAD on cath treated w/ CABG x 4 w/ LIMA-LAD, SVG-OM, SVG-Diag, SVG-distal LAD. EF normal. POD #5. Progressing well. No post op atrial arrhthymias. Plan medical therapy for secondary prevention. Treating w/ ASA, Plavix, high intensity statin and  blocker. No ACE/ARB due to angioedema w/ lisinopril.    2. HTN: BP goal < 130/80. Mildly elevated today. Can further titrate metoprolol.   3. HLD: no recent FLP on file. We can follow in clinic. Given CAD, recommend target LDL goal < 70 mg/dL. High intensity statin, Lipitor 80, added for NSTEMI. Will plan to check outpatient FLP and HFTs in 6-8 weeks. If not at recommend goal, can later add Zetia or consider PCSK9i therapy.     CARDIOLOGY RECOMMENDATIONS:  Discharge is anticipated in the next 48 hours. Recommendations for medications and follow up:  Discharge Medications: [facility] Exceptions to the above:    Follow Up: The patient's Primary Cardiologist is Armanda Magicraci Turner, MD  Follow up in the office in 1-2 week(s).  Beau FannySigned,  Brittainy Simmons, PA-C  10:04 AM 06/15/2019  [facility]  Patient seen and examined. Agree with assessment and plan. No anginal symptoms. With continue increased BP and HR ~ 100 will further titrate metoprolol to 50 mg bid. Now on high potency statin with LDL target < 70; add zetia if unable to achieve on outpatient reading in 2 months   Lennette Biharihomas A. Jakhai Fant, MD, [facility] 06/15/2019 11:27 AM

## 2019-06-15 NOTE — Progress Notes (Signed)
CARDIAC REHAB PHASE I   PRE:  Rate/Rhythm: 95 SR    BP: sitting 131/79    SaO2: 95 RA  MODE:  Ambulation: 130 ft   POST:  Rate/Rhythm: 117 ST    BP: sitting 157/108, retake 159/90     SaO2: 93 RA  Pt agreeable to walk. Stood well and used RW. Pt quite weak and exerted. Stated she was ok (using interpreter) but legs swayed and she was SOB. HR elevated. Rested in hall several standing rest stops for SOB and fatigue. She did begin to c/o low back pain on return to room. To BR then return to recliner. BP and HR elevated. Had pt practice IS, which she was only able to achieve 250 mL. It is difficult to get her to understand correct mechanics. Encouraged practice and x2 more walks today. Will need family for education. [place], ACSM 06/15/2019 11:20 AM

## 2019-06-15 NOTE — TOC Initial Note (Signed)
Transition of Care (TOC) - Initial/Assessment Note  Kimberly Gibbons RN, BSN Transitions of Care Unit 4E- RN Case Manager [phone]   Patient Details  Name: Kimberly Bryant MRN: [id number] Date of Birth: [birth year]  Transition of Care [facility]) CM/SW Contact:    Kimberly Patricia, RN Phone Number: 06/15/2019, 2:48 PM  Clinical Narrative:                 Pt admitted with [facility] s/p CABG, referral received for [facility] needs- spoke with pt's niece at the bedside regarding transition plans/needs. Per niece pt lives with her and her husband who both work, they moved here from [place] about 8 mo. Ago. - discussed transition needs and preference for having someone at home with her post discharge. Kimberly Bryant states pt has some other family that she can call to see if they can help stay with pt initially on discharge and she states that she would be able to call pt from work throughout the day to check on her. Pt is ambulating greater than 100 feet with cardiac rehab and doubt insurance would approve a rehab stay. Daughter and pt would prefer to return home. List provided for [facility] agencies Per CMS guidelines from medicare.gov website with star ratings (copy placed in shadow chart)- discussed what services  HH would provide- daughter agreeable to [facility] services and does not have a preference - ask CM to find an appropriate agency. DME once ordered will be delivered to room by [place].  Pt will need HHRN/PT orders placed along with DME RW and 3n1 orders   Expected Discharge Plan: [place] Barriers to Discharge: Continued Medical Work up   Patient Goals and CMS Choice Patient states their goals for this hospitalization and ongoing recovery are:: "to return home" CMS Medicare.gov Compare Post Acute Care list provided to:: (niece) Choice offered to / list presented to : Adult Children, Patient  Expected Discharge Plan and Services Expected Discharge Plan: [place]   Discharge  Planning Services: CM Consult Post Acute Care Choice: Durable Medical Equipment, Home Health Living arrangements for the past 2 months: Single Family Home                 DME Arranged: 3-N-1, Walker rolling DME Agency: [employer]                  Prior Living Arrangements/Services Living arrangements for the past 2 months: Single Family Home Lives with:: Adult Children Patient language and need for interpreter reviewed:: Yes(Haitian creole) Do you feel safe going back to the place where you live?: Yes      Need for Family Participation in Patient Care: Yes (Comment) Care giver support system in place?: Yes (comment)   Criminal Activity/Legal Involvement Pertinent to Current Situation/Hospitalization: No - Comment as needed  Activities of Daily Living Home Assistive Devices/Equipment: None ADL Screening (condition at time of admission) Patient's cognitive ability adequate to safely complete daily activities?: Yes Is the patient deaf or have difficulty hearing?: No Does the patient have difficulty seeing, even when wearing glasses/contacts?: No Does the patient have difficulty concentrating, remembering, or making decisions?: No Patient able to express need for assistance with ADLs?: Yes Does the patient have difficulty dressing or bathing?: No Independently performs ADLs?: Yes (appropriate for developmental age) Does the patient have difficulty walking or climbing stairs?: Yes Weakness of Legs: None Weakness of Arms/Hands: None  Permission Sought/Granted Permission sought to share information with : Facility Contact  Representative Permission granted to share information with : Yes, Verbal Permission Granted  Share Information with NAME: Carole CivilMarie Demoschene- daughter  Permission granted to share info w AGENCY: [facility] agency  Permission granted to share info w Relationship: niece "daughter"  Permission granted to share info w Contact Information: [phone]  Emotional  Assessment Appearance:: Appears stated age Attitude/Demeanor/Rapport: Engaged Affect (typically observed): Appropriate Orientation: : Oriented to Self, Oriented to Place, Oriented to  Time, Oriented to Situation   Psych Involvement: No (comment)  Admission diagnosis:  NSTEMI (non-ST elevated myocardial infarction) [facility](HCC) [I21.4] Patient Active Problem List   Diagnosis Date Noted  . 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 Atypical chest pain   . Elevated troponin   . Hypertensive urgency   . NSTEMI (non-ST elevated myocardial infarction) (HCC)   . ACS (acute coronary syndrome) (HCC) 06/08/2019  . CAD (coronary artery disease) 09/15/2018  . HTN (hypertension) 09/15/2018  . GERD (gastroesophageal reflux disease) 09/15/2018  . Chest pain, atypical 09/15/2018  . Hyperlipidemia 09/15/2018   PCP:  Cain SaupeFulp, Cammie, MD Pharmacy:   [facility] [address] [place] [address] [place] [id number] Phone: [phone] Fax: [phone]     Social Determinants of Health (SDOH) Interventions    Readmission Risk Interventions No flowsheet data found.

## 2019-06-16 LAB — GLUCOSE, CAPILLARY
Glucose-Capillary: 85 mg/dL (ref 70–99)
Glucose-Capillary: 162 mg/dL — ABNORMAL HIGH (ref 70–99)

## 2019-06-16 MED ORDER — TRAMADOL HCL 50 MG PO TABS: 50.0000 mg | ORAL_TABLET | Freq: Four times a day (QID) | ORAL | 0 refills | Status: DC | PRN

## 2019-06-16 MED ORDER — ASPIRIN 81 MG PO TBEC: 81.0000 mg | DELAYED_RELEASE_TABLET | Freq: Every day | ORAL | Status: DC

## 2019-06-16 MED ORDER — ATORVASTATIN CALCIUM 80 MG PO TABS: 80.0000 mg | ORAL_TABLET | Freq: Every day | ORAL | 1 refills | Status: DC

## 2019-06-16 MED ORDER — PHENYLEPHRINE HCL-NACL 10-0.9 MG/250ML-% IV SOLN
INTRAVENOUS | Status: AC
  Filled 2019-06-16: qty 500

## 2019-06-16 MED ORDER — CLOPIDOGREL BISULFATE 75 MG PO TABS: 75.0000 mg | ORAL_TABLET | Freq: Every day | ORAL | 1 refills | Status: DC

## 2019-06-16 MED ORDER — PHENYLEPHRINE HCL-NACL 10-0.9 MG/250ML-% IV SOLN
INTRAVENOUS | Status: AC
  Filled 2019-06-16: qty 250

## 2019-06-16 MED ORDER — METOPROLOL TARTRATE 50 MG PO TABS: 50.0000 mg | ORAL_TABLET | Freq: Two times a day (BID) | ORAL | 1 refills | Status: DC

## 2019-06-16 MED ORDER — AMLODIPINE BESYLATE 10 MG PO TABS
10.0000 mg | ORAL_TABLET | Freq: Every day | ORAL | Status: DC
  Administered 2019-06-16: 10 mg via ORAL
  Filled 2019-06-16: qty 1

## 2019-06-16 NOTE — Progress Notes (Signed)
Discussed sternal precautions, IS, walking x3 daily, diet, and CRPII with pt and daughter through the interpreter. Voiced understanding. Will refer to [place]. N/a for virtual CR. [id number] Yves Dill CES, ACSM 1:47 PM 06/16/2019

## 2019-06-16 NOTE — TOC Transition Note (Signed)
Transition of Care [facility]) - CM/SW Discharge Note Marvetta Gibbons RN, BSN Transitions of Care Unit 4E- RN Case Manager [phone]   Patient Details  Name: Kimberly Bryant MRN: [id number] Date of Birth: 1955/12/09  Transition of Care [facility]) CM/SW Contact:  [name], RN Phone Number: 06/16/2019, 11:56 AM   Clinical Narrative:    Pt stable for transition home today, orders have been placed for DME and HHRN/PT. As per conversation with niece on 8/10- referral for [facility] needs made-  [facility] - declined [facility]- declined [facility]- accepted referral for HHRN/PT-per Tiffany start of care will be either Thur/Fri. Of this week.  Call also made to [facility] with [employer] for DME needs- RW and 3n1 to be delivered to room prior to discharge.    Final next level of care: [place] Barriers to Discharge: Barriers Resolved, No Barriers Identified   Patient Goals and CMS Choice Patient states their goals for this hospitalization and ongoing recovery are:: "to return home" CMS Medicare.gov Compare Post Acute Care list provided to:: (niece) Choice offered to / list presented to : Adult Children, Patient  Discharge Placement  Home with [facility]                     Discharge Plan and Services   Discharge Planning Services: CM Consult Post Acute Care Choice: Durable Medical Equipment, Home Health          DME Arranged: 3-N-1, Walker rolling DME Agency: [employer] Date DME Agency Contacted: 06/16/19 Time DME Agency Contacted: 1006 Representative spoke with at DME Agency: Thedore Mins HH Arranged: RN, PT [place] Agency: [facility] [employer]) Date [place]: 06/16/19 Time [place]: 57 Representative spoke with at [place]: [place] Interventions     Readmission Risk Interventions Readmission Risk Prevention Plan 06/16/2019  Transportation Screening Complete  PCP or Specialist Appt within 5-7 Days  Complete  Home Care Screening Complete  Medication Review (RN CM) Complete

## 2019-06-16 NOTE — Progress Notes (Signed)
[address]       [place] [address]             [phone]      6 Days Post-Op Procedure(s) (LRB): CORONARY ARTERY BYPASS GRAFTING (CABG) x Four, using left internal mammary artery and right leg greater saphenous vein harvested endoscopically (N/A) TRANSESOPHAGEAL ECHOCARDIOGRAM (TEE) (N/A) Subjective: No c/o , sleepy this morning  Objective: Vital signs in last 24 hours: Temp:  [98.3 F (36.8 C)-99.3 F (37.4 C)] 99.3 F (37.4 C) (08/11 0442) Pulse Rate:  [75-104] 86 (08/11 0442) Cardiac Rhythm: Normal sinus rhythm (08/11 0700) Resp:  [16-29] 19 (08/11 0500) BP: (128-155)/(71-91) 155/90 (08/11 0442) SpO2:  [94 %-100 %] 99 % (08/11 0442) Weight:  [91.9 kg] 91.9 kg (08/11 0437)  Hemodynamic parameters for last 24 hours:    Intake/Output from previous day: 08/10 0701 - 08/11 0700 In: 270 [P.O.:270] Out: 950 [Urine:950] Intake/Output this shift: No intake/output data recorded.  General appearance: alert, cooperative and no distress Heart: regular rate and rhythm Lungs: mildly dim in bases Abdomen: benign Extremities: minor edema Wound: incis healing well  Lab Results: Recent Labs    06/14/19 0258  WBC 10.5  HGB 8.3*  HCT 26.2*  PLT 233   BMET:  Recent Labs    06/14/19 0258  NA 137  K 4.6  CL 103  CO2 24  GLUCOSE 131*  BUN 23  CREATININE 0.95  CALCIUM 8.8*    PT/INR: No results for input(s): LABPROT, INR in the last 72 hours. ABG    Component Value Date/Time   PHART 7.312 (L) 06/10/2019 1947   HCO3 23.1 06/10/2019 1947   TCO2 22 06/10/2019 2014   ACIDBASEDEF 3.0 (H) 06/10/2019 1947   O2SAT 97.0 06/10/2019 1947   CBG (last 3)  Recent Labs    06/15/19 1616 06/15/19 2132 06/16/19 0621  GLUCAP 137* 105* 85    Meds Scheduled Meds: . aspirin EC  81 mg Oral Daily  . atorvastatin  80 mg Oral q1800  . bisacodyl  10 mg Oral Daily   Or  . bisacodyl  10 mg Rectal Daily  . Chlorhexidine Gluconate Cloth  6 each  Topical Daily  . clopidogrel  75 mg Oral Daily  . docusate sodium  200 mg Oral Daily  . dronabinol  5 mg Oral BID AC  . enoxaparin (LOVENOX) injection  40 mg Subcutaneous QHS  . insulin aspart  0-24 Units Subcutaneous TID AC & HS  . metoprolol tartrate  50 mg Oral BID  . pantoprazole  40 mg Oral Daily  . sodium chloride flush  3 mL Intravenous Q12H   Continuous Infusions: . sodium chloride Stopped (06/11/19 0835)  . sodium chloride    . sodium chloride 20 mL/hr at 06/10/19 1439  . lactated ringers    . lactated ringers 10 mL/hr at 06/12/19 0600  . lactated ringers 50 mL/hr at 06/12/19 0746   PRN Meds:.sodium chloride, lactated ringers, metoprolol tartrate, ondansetron (ZOFRAN) IV, sodium chloride flush, traMADol  Xrays No results found.  Assessment/Plan: S/P Procedure(s) (LRB): CORONARY ARTERY BYPASS GRAFTING (CABG) x Four, using left internal mammary artery and right leg greater saphenous vein harvested endoscopically (N/A) TRANSESOPHAGEAL ECHOCARDIOGRAM (TEE) (N/A)  1 doing well overall, some HTN- resume home norvasc, sinus rhythm, sats good on RA 2 HH needs ordered 3 epw's removed 4 no new labs or CXR's 5 sugars adeq controlled 6 stable for discharge   LOS: 8 days  Rowe ClackWayne E [name] Bryant [facility] 06/16/2019 Pager [phone]

## 2019-06-16 NOTE — Progress Notes (Signed)
We have received discharge orders for patient.  I have discussed her plan of care with her with the video interpreter as well as completed her assessment. Cardiac rehab needs to review education and prefers that family be present as well, if possible.  I have called both numbers listed for pt's family and left voice messages.  Chest tube sutures removed, as per orders.  Pt does not have any complaints at this time. She is sitting up in recliner, alert and oriented.  Call bell within reach and reminded to use when assistance needed.  She expressed understanding.

## 2019-06-17 ENCOUNTER — Telehealth (HOSPITAL_COMMUNITY): Payer: Self-pay

## 2019-06-17 NOTE — Telephone Encounter (Signed)
**Note De-Identified Terrill Wauters Obfuscation** I called Geophysical data processor and was assisted by Ludmilla Patient's daughter Brookelle Pellicane [facility]) contacted regarding the pts discharge from [facility] on 06/12/2019.  Patient understands to follow up with provider: Navaeh is requesting another appt time as she works and can only bring the pt for f/u in the mornings only. Leane is aware that a scheduler will be calling them back to re-schedule the pts f/u.  Patient understands discharge instructions? Yes Patient understands medications and regiment? Yes Patient understands to bring all medications to this visit? Yes  Will forward this message to our scheduling team to contact the pts daughter Baeleigh at [phone] to re-schedule.

## 2019-06-17 NOTE — Telephone Encounter (Signed)
Pt insurance is active and benefits verified through [place] 0, DED $600/$130.20 met, out of pocket $4,000/$288.04 met, co-insurance 20%. no pre-authorization required, REF# [id number]  Will contact patient to see if she is interested in the Cardiac Rehab Program. If interested, patient will need to complete follow up appt. Once completed, patient will be contacted for scheduling upon review by the RN Navigator.

## 2019-06-21 ENCOUNTER — Inpatient Hospital Stay (HOSPITAL_COMMUNITY)
Admission: EM | Admit: 2019-06-21 | Discharge: 2019-07-02 | DRG: 856 | Disposition: A | Discharge status: Skilled Nursing Facility | Payer: 59 | Attending: Cardiothoracic Surgery | Admitting: Cardiothoracic Surgery

## 2019-06-21 ENCOUNTER — Other Ambulatory Visit: Payer: Self-pay

## 2019-06-21 ENCOUNTER — Emergency Department (HOSPITAL_COMMUNITY): Payer: 59

## 2019-06-21 ENCOUNTER — Encounter (HOSPITAL_COMMUNITY): Payer: Self-pay | Admitting: *Deleted

## 2019-06-21 DIAGNOSIS — Z20828 Contact with and (suspected) exposure to other viral communicable diseases: Secondary | ICD-10-CM | POA: Diagnosis present

## 2019-06-21 DIAGNOSIS — K219 Gastro-esophageal reflux disease without esophagitis: Secondary | ICD-10-CM | POA: Diagnosis present

## 2019-06-21 DIAGNOSIS — Z8249 Family history of ischemic heart disease and other diseases of the circulatory system: Secondary | ICD-10-CM

## 2019-06-21 DIAGNOSIS — J90 Pleural effusion, not elsewhere classified: Secondary | ICD-10-CM

## 2019-06-21 DIAGNOSIS — I214 Non-ST elevation (NSTEMI) myocardial infarction: Secondary | ICD-10-CM | POA: Diagnosis present

## 2019-06-21 DIAGNOSIS — I2510 Atherosclerotic heart disease of native coronary artery without angina pectoris: Secondary | ICD-10-CM | POA: Diagnosis present

## 2019-06-21 DIAGNOSIS — Z7982 Long term (current) use of aspirin: Secondary | ICD-10-CM

## 2019-06-21 DIAGNOSIS — Z6839 Body mass index (BMI) 39.0-39.9, adult: Secondary | ICD-10-CM

## 2019-06-21 DIAGNOSIS — D62 Acute posthemorrhagic anemia: Secondary | ICD-10-CM | POA: Diagnosis not present

## 2019-06-21 DIAGNOSIS — Z7902 Long term (current) use of antithrombotics/antiplatelets: Secondary | ICD-10-CM

## 2019-06-21 DIAGNOSIS — Z951 Presence of aortocoronary bypass graft: Secondary | ICD-10-CM

## 2019-06-21 DIAGNOSIS — T8131XA Disruption of external operation (surgical) wound, not elsewhere classified, initial encounter: Secondary | ICD-10-CM | POA: Diagnosis present

## 2019-06-21 DIAGNOSIS — J939 Pneumothorax, unspecified: Secondary | ICD-10-CM

## 2019-06-21 DIAGNOSIS — J9383 Other pneumothorax: Secondary | ICD-10-CM | POA: Diagnosis not present

## 2019-06-21 DIAGNOSIS — E785 Hyperlipidemia, unspecified: Secondary | ICD-10-CM | POA: Diagnosis present

## 2019-06-21 DIAGNOSIS — T8141XA Infection following a procedure, superficial incisional surgical site, initial encounter: Principal | ICD-10-CM | POA: Diagnosis present

## 2019-06-21 DIAGNOSIS — E669 Obesity, unspecified: Secondary | ICD-10-CM | POA: Diagnosis present

## 2019-06-21 DIAGNOSIS — J918 Pleural effusion in other conditions classified elsewhere: Secondary | ICD-10-CM | POA: Diagnosis present

## 2019-06-21 DIAGNOSIS — I10 Essential (primary) hypertension: Secondary | ICD-10-CM | POA: Diagnosis present

## 2019-06-21 DIAGNOSIS — Z4682 Encounter for fitting and adjustment of non-vascular catheter: Secondary | ICD-10-CM

## 2019-06-21 DIAGNOSIS — Y832 Surgical operation with anastomosis, bypass or graft as the cause of abnormal reaction of the patient, or of later complication, without mention of misadventure at the time of the procedure: Secondary | ICD-10-CM | POA: Diagnosis present

## 2019-06-21 DIAGNOSIS — Z09 Encounter for follow-up examination after completed treatment for conditions other than malignant neoplasm: Secondary | ICD-10-CM

## 2019-06-21 DIAGNOSIS — E8770 Fluid overload, unspecified: Secondary | ICD-10-CM | POA: Diagnosis present

## 2019-06-21 DIAGNOSIS — T8130XA Disruption of wound, unspecified, initial encounter: Secondary | ICD-10-CM

## 2019-06-21 LAB — CBC
HCT: 28.1 % — ABNORMAL LOW (ref 36.0–46.0)
Hemoglobin: 8.6 g/dL — ABNORMAL LOW (ref 12.0–15.0)
MCH: 27.4 pg (ref 26.0–34.0)
MCHC: 30.6 g/dL (ref 30.0–36.0)
MCV: 89.5 fL (ref 80.0–100.0)
Platelets: 566 10*3/uL — ABNORMAL HIGH (ref 150–400)
RBC: 3.14 MIL/uL — ABNORMAL LOW (ref 3.87–5.11)
RDW: 13.7 % (ref 11.5–15.5)
WBC: 10.5 10*3/uL (ref 4.0–10.5)
nRBC: 0 % (ref 0.0–0.2)

## 2019-06-21 LAB — BASIC METABOLIC PANEL
Anion gap: 12 (ref 5–15)
BUN: 9 mg/dL (ref 8–23)
CO2: 21 mmol/L — ABNORMAL LOW (ref 22–32)
Calcium: 8.5 mg/dL — ABNORMAL LOW (ref 8.9–10.3)
Chloride: 103 mmol/L (ref 98–111)
Creatinine, Ser: 0.97 mg/dL (ref 0.44–1.00)
GFR calc Af Amer: >60 mL/min (ref >60)
GFR calc non Af Amer: >60 mL/min (ref >60)
Glucose, Bld: 129 mg/dL — ABNORMAL HIGH (ref 70–99)
Potassium: 3.6 mmol/L (ref 3.5–5.1)
Sodium: 136 mmol/L (ref 135–145)

## 2019-06-21 LAB — TROPONIN I (HIGH SENSITIVITY)
Troponin I (High Sensitivity): 23 ng/L — ABNORMAL HIGH (ref <18)
Troponin I (High Sensitivity): 20 ng/L — ABNORMAL HIGH (ref <18)

## 2019-06-21 LAB — LACTIC ACID, PLASMA
Lactic Acid, Venous: 1.4 mmol/L (ref 0.5–1.9)
Lactic Acid, Venous: 1.6 mmol/L (ref 0.5–1.9)

## 2019-06-21 LAB — SARS CORONAVIRUS 2 BY RT PCR (HOSPITAL ORDER, PERFORMED IN ~~LOC~~ HOSPITAL LAB): SARS Coronavirus 2: NEGATIVE

## 2019-06-21 MED ORDER — SODIUM CHLORIDE 0.9% FLUSH
3.0000 mL | Freq: Two times a day (BID) | INTRAVENOUS | Status: DC
  Administered 2019-06-21 – 2019-07-01 (×20): 3 mL via INTRAVENOUS

## 2019-06-21 MED ORDER — ONDANSETRON HCL 4 MG PO TABS: 4.0000 mg | ORAL_TABLET | Freq: Four times a day (QID) | ORAL | Status: DC | PRN

## 2019-06-21 MED ORDER — SODIUM CHLORIDE 0.9 % IV SOLN
2.0000 g | Freq: Three times a day (TID) | INTRAVENOUS | Status: DC
  Administered 2019-06-21 – 2019-06-22 (×4): 2 g via INTRAVENOUS
  Filled 2019-06-21 (×7): qty 2

## 2019-06-21 MED ORDER — METOPROLOL TARTRATE 25 MG PO TABS: 50.0000 mg | ORAL_TABLET | Freq: Two times a day (BID) | ORAL | Status: DC

## 2019-06-21 MED ORDER — ASPIRIN EC 81 MG PO TBEC
81.0000 mg | DELAYED_RELEASE_TABLET | Freq: Every day | ORAL | Status: DC
  Administered 2019-06-22: 81 mg via ORAL
  Filled 2019-06-21: qty 1

## 2019-06-21 MED ORDER — ACETAMINOPHEN 650 MG RE SUPP: 650.0000 mg | Freq: Four times a day (QID) | RECTAL | Status: DC | PRN

## 2019-06-21 MED ORDER — ONDANSETRON HCL 4 MG/2ML IJ SOLN: 4.0000 mg | Freq: Four times a day (QID) | INTRAMUSCULAR | Status: DC | PRN

## 2019-06-21 MED ORDER — ACETAMINOPHEN 325 MG PO TABS
650.0000 mg | ORAL_TABLET | Freq: Four times a day (QID) | ORAL | Status: DC | PRN
  Administered 2019-06-24: 650 mg via ORAL
  Filled 2019-06-21: qty 2

## 2019-06-21 MED ORDER — DOCUSATE SODIUM 100 MG PO CAPS: 100.0000 mg | ORAL_CAPSULE | Freq: Two times a day (BID) | ORAL | Status: DC | PRN

## 2019-06-21 MED ORDER — AMLODIPINE BESYLATE 10 MG PO TABS
10.0000 mg | ORAL_TABLET | Freq: Every day | ORAL | Status: DC
  Administered 2019-06-22 – 2019-06-23 (×2): 10 mg via ORAL
  Filled 2019-06-21 (×2): qty 1

## 2019-06-21 MED ORDER — VANCOMYCIN HCL IN DEXTROSE 1-5 GM/200ML-% IV SOLN
1000.0000 mg | INTRAVENOUS | Status: DC
  Administered 2019-06-21 – 2019-06-22 (×2): 1000 mg via INTRAVENOUS
  Filled 2019-06-21 (×2): qty 200

## 2019-06-21 MED ORDER — POTASSIUM CHLORIDE CRYS ER 20 MEQ PO TBCR
20.0000 meq | EXTENDED_RELEASE_TABLET | Freq: Every day | ORAL | Status: DC
  Administered 2019-06-22 – 2019-07-02 (×10): 20 meq via ORAL
  Filled 2019-06-21 (×10): qty 1

## 2019-06-21 MED ORDER — PANTOPRAZOLE SODIUM 40 MG PO TBEC
40.0000 mg | DELAYED_RELEASE_TABLET | Freq: Two times a day (BID) | ORAL | Status: DC
  Administered 2019-06-22 – 2019-07-02 (×19): 40 mg via ORAL
  Filled 2019-06-21 (×18): qty 1

## 2019-06-21 MED ORDER — SODIUM CHLORIDE 0.9% FLUSH
3.0000 mL | Freq: Once | INTRAVENOUS | Status: AC
  Administered 2019-06-21: 3 mL via INTRAVENOUS

## 2019-06-21 MED ORDER — FUROSEMIDE 40 MG PO TABS
40.0000 mg | ORAL_TABLET | Freq: Every day | ORAL | Status: DC
  Administered 2019-06-22 – 2019-07-02 (×11): 40 mg via ORAL
  Filled 2019-06-21 (×11): qty 1

## 2019-06-21 MED ORDER — ATORVASTATIN CALCIUM 80 MG PO TABS
80.0000 mg | ORAL_TABLET | Freq: Every day | ORAL | Status: DC
  Administered 2019-06-22 – 2019-07-01 (×8): 80 mg via ORAL
  Filled 2019-06-21 (×9): qty 1

## 2019-06-21 MED ORDER — TRAMADOL HCL 50 MG PO TABS
50.0000 mg | ORAL_TABLET | ORAL | Status: DC | PRN
  Administered 2019-06-22 – 2019-06-25 (×4): 50 mg via ORAL
  Filled 2019-06-21 (×6): qty 1

## 2019-06-21 NOTE — ED Triage Notes (Signed)
The pt has soleaking sero sanguineous  Drainage from the lower incision  And from the drain marks  ckeaned with a dsd applied

## 2019-06-21 NOTE — ED Triage Notes (Signed)
The pt speaks [place] pointing to a mid-chest scar that appears to be fairly fresh  And she is rubbing that area as if in pain

## 2019-06-21 NOTE — ED Triage Notes (Signed)
The pt has some bloody drainage frfom under her lt breast no idea how long

## 2019-06-21 NOTE — ED Provider Notes (Signed)
[facility] EMERGENCY DEPARTMENT Provider Note   CSN: [id number] Arrival date & time: 06/21/19  1559    History   Chief Complaint Chief Complaint  Patient presents with  . Chest Pain  [place] interpreter utilized via [employer] iPad  HPI Kimberly Bryant is a 63 y.o. female who presents with serosanguineous drainage from a chest wall incision site.  Patient was admitted back on 8/5 and underwent a four-vessel CABG on 8/6.  He was discharged on 8/11.  She states that things were going well until she noticed some drainage from inferior incision site.  She states that she has had some accompanying shortness of breath specifically when laying flat.  She denies any chest pain states that the symptoms that brought her in the first time have resolved.  Past Medical History:  Diagnosis Date  . CAD (coronary artery disease)   . Hyperlipidemia   . Hypertension     Patient Active Problem List   Diagnosis Date Noted  . 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 Atypical chest pain   . Elevated troponin   . Hypertensive urgency   . NSTEMI (non-ST elevated myocardial infarction) ([place])   . ACS (acute coronary syndrome) ([place]) 06/08/2019  . CAD (coronary artery disease) 09/15/2018  . HTN (hypertension) 09/15/2018  . GERD (gastroesophageal reflux disease) 09/15/2018  . Chest pain, atypical 09/15/2018  . Hyperlipidemia 09/15/2018    Past Surgical History:  Procedure Laterality Date  . CORONARY ARTERY BYPASS GRAFT N/A 06/10/2019   Procedure: CORONARY ARTERY BYPASS GRAFTING (CABG) x Four, using left internal mammary artery and right leg greater saphenous vein harvested endoscopically;  Surgeon: Wonda Olds, MD;  Location: [facility];  Service: Open Heart Surgery;  Laterality: N/A;  . LEFT HEART CATH AND CORONARY ANGIOGRAPHY N/A 06/09/2019   Procedure: LEFT HEART CATH AND CORONARY ANGIOGRAPHY;  Surgeon: Troy Sine, MD;  Location: [facility];  Service: Cardiovascular;  Laterality:  N/A;  . TEE WITHOUT CARDIOVERSION N/A 06/10/2019   Procedure: TRANSESOPHAGEAL ECHOCARDIOGRAM (TEE);  Surgeon: Wonda Olds, MD;  Location: [facility];  Service: Open Heart Surgery;  Laterality: N/A;     OB History   No obstetric history on file.      Home Medications    Prior to Admission medications   Medication Sig Start Date End Date Taking? Authorizing Provider  amLODipine (NORVASC) 10 MG tablet Take 1 tablet (10 mg total) by mouth daily. 05/18/19  Yes Fulp, Cammie, MD  aspirin EC 81 MG EC tablet Take 1 tablet (81 mg total) by mouth daily. 06/16/19  Yes Gold, Wayne E, PA-C  atorvastatin (LIPITOR) 80 MG tablet Take 1 tablet (80 mg total) by mouth daily at 6 PM. 06/16/19  Yes Gold, Wilder Glade, PA-C  clopidogrel (PLAVIX) 75 MG tablet Take 1 tablet (75 mg total) by mouth daily. 06/16/19  Yes Gold, Wayne E, PA-C  lansoprazole (PREVACID) 30 MG capsule Take 1 capsule (30 mg total) by mouth 2 (two) times daily. To reduce stomach acid 05/18/19  Yes Fulp, Cammie, MD  metoprolol tartrate (LOPRESSOR) 50 MG tablet Take 1 tablet (50 mg total) by mouth 2 (two) times daily. 06/16/19  Yes Gold, Wayne E, PA-C  traMADol (ULTRAM) 50 MG tablet Take 1 tablet (50 mg total) by mouth every 6 (six) hours as needed for up to 7 days for moderate pain. 06/16/19 06/23/19  Gold, Patrick Jupiter E, PA-C  lisinopril (ZESTRIL) 5 MG tablet Take 1 tablet (5 mg total) by  mouth daily. 05/18/19 06/07/19  Cain SaupeFulp, Cammie, MD    Family History Family History  Problem Relation Age of Onset  . Hypertension Mother     Social History Social History   Tobacco Use  . Smoking status: Never Smoker  . Smokeless tobacco: Never Used  Substance Use Topics  . Alcohol use: Never    Frequency: Never  . Drug use: Never     Allergies   Carafate [sucralfate] and Lisinopril   Review of Systems Review of Systems  Constitutional: Negative for activity change, chills and fever.  Respiratory: Positive for shortness of breath.   Gastrointestinal:  Negative for abdominal pain, constipation, diarrhea, nausea and vomiting.  Skin: Negative for color change.  Neurological: Negative for dizziness and weakness.     Physical Exam Updated Vital Signs BP 130/68 (BP Location: Right Arm)   Pulse 96   Temp 98.4 F (36.9 C) (Oral)   Resp 14   Wt 91.9 kg   SpO2 94%   BMI 39.57 kg/m   Physical Exam Constitutional:      Appearance: She is well-developed.  HENT:     Head: Normocephalic.  Eyes:     Extraocular Movements: Extraocular movements intact.     Pupils: Pupils are equal, round, and reactive to light.  Cardiovascular:     Heart sounds: Normal heart sounds. Heart sounds not distant.  Pulmonary:     Effort: Pulmonary effort is normal.     Comments: Coarse breath sounds left lung, mid to lower lung. Chest:     Chest wall: No mass or deformity.  Abdominal:     Palpations: Abdomen is soft.  Musculoskeletal: Normal range of motion.  Skin:    General: Skin is warm and dry.  Neurological:     Mental Status: She is alert.   Chest: Large incision extending from substernal area to sternal notch.  Well-healing.  Approximately half centimeter by half centimeter area with serosanguineous drainage noted at inferior border.   ED Treatments / Results  Labs (all labs ordered are listed, but only abnormal results are displayed) Labs Reviewed  BASIC METABOLIC PANEL - Abnormal; Notable for the following components:      Result Value   CO2 21 (*)    Glucose, Bld 129 (*)    Calcium 8.5 (*)    All other components within normal limits  CBC - Abnormal; Notable for the following components:   RBC 3.14 (*)    Hemoglobin 8.6 (*)    HCT 28.1 (*)    Platelets 566 (*)    All other components within normal limits  TROPONIN I (HIGH SENSITIVITY) - Abnormal; Notable for the following components:   Troponin I (High Sensitivity) 23 (*)    All other components within normal limits  SARS CORONAVIRUS 2 (HOSPITAL ORDER, PERFORMED IN [place]  [facility])  LACTIC ACID, PLASMA  LACTIC ACID, PLASMA  TROPONIN I (HIGH SENSITIVITY)    EKG None  Radiology Dg Chest 2 View  Result Date: 06/21/2019 CLINICAL DATA:  Chest pain.  Recent CABG. EXAM: CHEST - 2 VIEW COMPARISON:  Chest x-ray dated June 13, 2019. FINDINGS: Stable cardiomegaly status post CABG. Increased moderate to large left pleural effusion. Unchanged small right pleural effusion. New collapse/consolidation of the lingula and left lower lobe. No pneumothorax. No acute osseous abnormality. IMPRESSION: 1. Increased now moderate to large left pleural effusion with new collapse/consolidation of the lingula and left lower lobe. 2. Unchanged small right pleural effusion. Electronically Signed   By:  Obie DredgeWilliam T Derry M.D.   On: 06/21/2019 17:01    Procedures Procedures (including critical care time)  Medications Ordered in ED Medications  sodium chloride flush (NS) 0.9 % injection 3 mL (has no administration in time range)     Initial Impression / Assessment and Plan / ED Course  I have reviewed the triage vital signs and the nursing notes.  Pertinent labs & imaging results that were available during my care of the patient were reviewed by me and considered in my medical decision making (see chart for details).  63 year old female who presents with serosanguineous drainage from chest incision.  She is now 10 days post CABG.  TV chest x-ray showing collapse of medial lower lung secondary to large pleural effusion.  Given her imaging findings and physical exam discussed case with cardiothoracic surgery.  Recommended on holding off on CT scan for now.  They will admit patient.  Final Clinical Impressions(s) / ED Diagnoses   Final diagnoses:  Wound dehiscence  Pleural effusion  S/P CABG (coronary artery bypass graft)    ED Discharge Orders    None    Myrene BuddyJacob Vantasia Pinkney MD PGY-3 Family Medicine Resident   Myrene BuddyFletcher, Zyra Parrillo, MD 06/21/19 [id number]    Blane OharaZavitz, Joshua, MD 06/21/19  [phone]

## 2019-06-21 NOTE — ED Notes (Signed)
Updated on wait for treatment room. 

## 2019-06-21 NOTE — Progress Notes (Signed)
TCTS Progress note  Discussed patient with ED physicians. Patient is well-known to me after recent CABG. Presented to ED with midline chest drainage and discomfort. WBC ok but CXR shows large left pleural effusion.   Suggest admit to CT surgery. Keep NPO for possible OR tomorrow   We will put admit orders in.  [place] Alspaugh Z. Orvan Seen, [place]

## 2019-06-21 NOTE — Progress Notes (Signed)
Pharmacy Antibiotic Note  Kimberly Bryant is a 63 y.o. female admitted on 06/21/2019 with wound infection.  Pharmacy has been consulted for Vancomycin and Cefepime dosing.  -Pt is afebrile and WBC is 10.5 -Vital signs are stable -Lactic acid = 1.6 -Chest X-ray from 8/16 shows increased now moderate to large left pleural effusion with new                  collapse/consolidation of the lingula and left lower lobe -Given that the pt appears to be stable and does not have clinical signs of infection, decided not to bolus with vancomycin to prevent AKI  Plan: Vancomycin 1000mg  IV every 24 hours. Goal trough 15-20 mcg/mL.  Cefepime 2g IV every 8 hours F/u with LOT and narrowing of therapy when approprriate  Weight: 202 lb 9.6 oz (91.9 kg)  Temp (24hrs), Avg:98.4 F (36.9 C), Min:98.4 F (36.9 C), Max:98.4 F (36.9 C)  Recent Labs  Lab 06/21/19 1625 06/21/19 2033  WBC 10.5  --   CREATININE 0.97  --   LATICACIDVEN 1.4 1.6    Estimated Creatinine Clearance: 60.9 mL/min (by C-G formula based on SCr of 0.97 mg/dL).    Allergies  Allergen Reactions  . Carafate [Sucralfate] Swelling and Other (See Comments)    Patient stated it made her face swell (angioedema)- no breathing issues, however  . Lisinopril Swelling and Other (See Comments)    Patient stated it made her face swell (angioedema)- no breathing issues, however    Antimicrobials this admission: 8/16 Vancomycin>> 8/16 Cefepime>>  Dose adjustments this admission: N/A  Microbiology results: None  Thank you for allowing pharmacy to be a part of this patient's care.  Kennon Holter, PharmD PGY1 Ambulatory Care Pharmacy Resident [employer] Phone: [phone] 06/21/2019 9:12 PM

## 2019-06-22 ENCOUNTER — Inpatient Hospital Stay (HOSPITAL_COMMUNITY): Payer: 59

## 2019-06-22 ENCOUNTER — Other Ambulatory Visit: Payer: Self-pay | Admitting: Cardiothoracic Surgery

## 2019-06-22 ENCOUNTER — Ambulatory Visit: Payer: 59 | Admitting: Gastroenterology

## 2019-06-22 DIAGNOSIS — J90 Pleural effusion, not elsewhere classified: Secondary | ICD-10-CM

## 2019-06-22 DIAGNOSIS — Z736 Limitation of activities due to disability: Secondary | ICD-10-CM

## 2019-06-22 LAB — CBC
HCT: 29.3 % — ABNORMAL LOW (ref 36.0–46.0)
HCT: 26.5 % — ABNORMAL LOW (ref 36.0–46.0)
Hemoglobin: 8.4 g/dL — ABNORMAL LOW (ref 12.0–15.0)
Hemoglobin: 8.1 g/dL — ABNORMAL LOW (ref 12.0–15.0)
MCH: 27.3 pg (ref 26.0–34.0)
MCH: 27.2 pg (ref 26.0–34.0)
MCHC: 28.7 g/dL — ABNORMAL LOW (ref 30.0–36.0)
MCHC: 30.6 g/dL (ref 30.0–36.0)
MCV: 95.1 fL (ref 80.0–100.0)
MCV: 88.9 fL (ref 80.0–100.0)
Platelets: 222 10*3/uL (ref 150–400)
Platelets: 534 10*3/uL — ABNORMAL HIGH (ref 150–400)
RBC: 3.08 MIL/uL — ABNORMAL LOW (ref 3.87–5.11)
RBC: 2.98 MIL/uL — ABNORMAL LOW (ref 3.87–5.11)
RDW: 13.8 % (ref 11.5–15.5)
RDW: 13.8 % (ref 11.5–15.5)
WBC: 9.5 10*3/uL (ref 4.0–10.5)
WBC: 11.3 10*3/uL — ABNORMAL HIGH (ref 4.0–10.5)
nRBC: 0.2 % (ref 0.0–0.2)
nRBC: 0.2 % (ref 0.0–0.2)

## 2019-06-22 LAB — BASIC METABOLIC PANEL
Anion gap: 11 (ref 5–15)
BUN: 12 mg/dL (ref 8–23)
CO2: 18 mmol/L — ABNORMAL LOW (ref 22–32)
Calcium: 8.4 mg/dL — ABNORMAL LOW (ref 8.9–10.3)
Chloride: 108 mmol/L (ref 98–111)
Creatinine, Ser: 0.98 mg/dL (ref 0.44–1.00)
GFR calc Af Amer: >60 mL/min (ref >60)
GFR calc non Af Amer: >60 mL/min (ref >60)
Glucose, Bld: 91 mg/dL (ref 70–99)
Potassium: 4.6 mmol/L (ref 3.5–5.1)
Sodium: 137 mmol/L (ref 135–145)

## 2019-06-22 LAB — C-REACTIVE PROTEIN: CRP: 8 mg/dL — ABNORMAL HIGH (ref <1.0)

## 2019-06-22 MED ORDER — COLCHICINE 0.6 MG PO TABS
0.6000 mg | ORAL_TABLET | Freq: Every day | ORAL | Status: DC
  Administered 2019-06-22 – 2019-07-02 (×10): 0.6 mg via ORAL
  Filled 2019-06-22 (×10): qty 1

## 2019-06-22 MED ORDER — LIDOCAINE-EPINEPHRINE (PF) 2 %-1:200000 IJ SOLN
INTRAMUSCULAR | Status: AC
  Administered 2019-06-22: 03:00:00
  Filled 2019-06-22: qty 20

## 2019-06-22 MED ORDER — ASPIRIN 325 MG PO TABS
325.0000 mg | ORAL_TABLET | Freq: Every day | ORAL | Status: DC
  Administered 2019-06-24 – 2019-07-02 (×9): 325 mg via ORAL
  Filled 2019-06-22 (×10): qty 1

## 2019-06-22 MED ORDER — FUROSEMIDE 10 MG/ML IJ SOLN
40.0000 mg | Freq: Once | INTRAMUSCULAR | Status: AC
  Administered 2019-06-22: 40 mg via INTRAVENOUS
  Filled 2019-06-22: qty 4

## 2019-06-22 MED ORDER — FENTANYL CITRATE (PF) 100 MCG/2ML IJ SOLN
50.0000 ug | Freq: Once | INTRAMUSCULAR | Status: AC
  Administered 2019-06-22: 50 ug via INTRAVENOUS

## 2019-06-22 MED ORDER — METOPROLOL SUCCINATE ER 25 MG PO TB24
25.0000 mg | ORAL_TABLET | Freq: Every day | ORAL | Status: DC
  Administered 2019-06-22: 25 mg via ORAL
  Filled 2019-06-22: qty 1

## 2019-06-22 MED ORDER — METOPROLOL TARTRATE 12.5 MG HALF TABLET
12.5000 mg | ORAL_TABLET | Freq: Three times a day (TID) | ORAL | Status: DC
  Administered 2019-06-22 – 2019-06-24 (×4): 12.5 mg via ORAL
  Filled 2019-06-22 (×4): qty 1

## 2019-06-22 MED ORDER — IBUPROFEN 200 MG PO TABS
400.0000 mg | ORAL_TABLET | Freq: Three times a day (TID) | ORAL | Status: AC
  Administered 2019-06-22 – 2019-06-24 (×3): 400 mg via ORAL
  Filled 2019-06-22 (×3): qty 2

## 2019-06-22 MED ORDER — FENTANYL CITRATE (PF) 100 MCG/2ML IJ SOLN: 100.0000 ug | Freq: Once | INTRAMUSCULAR | Status: DC

## 2019-06-22 MED ORDER — FENTANYL CITRATE (PF) 100 MCG/2ML IJ SOLN
INTRAMUSCULAR | Status: AC
  Filled 2019-06-22: qty 2

## 2019-06-22 NOTE — Procedures (Signed)
Chest Tube Insertion Procedure Note  Indications:  Clinically significant Effusion  Pre-operative Diagnosis: Effusion  Post-operative Diagnosis: Effusion  Procedure Details  Informed consent was obtained for the procedure, including sedation.  Risks of lung perforation, hemorrhage, arrhythmia, and adverse drug reaction were discussed.   After sterile skin prep, using standard technique, a 28 French tube was placed in the left anterolateral 5th rib space.  Findings: 700 ml of serosanguinous fluid obtained  Estimated Blood Loss:  Minimal         Specimens:  None              Complications:  None; patient tolerated the procedure well.         Disposition: ED, stable, awaiting bed assignment         Condition: stable  Attending Attestation: I performed the procedure.

## 2019-06-22 NOTE — ED Notes (Signed)
Patient found with copious amounts of serosanguineous drainage from dihisced wound covering her gown and bedding. Pt cleaned, bedding replaced, patient placed in new gown. Cardio-thoracic attending paged. Advised that he would place chest tube. Charge RN notified and pt moved to Trauma B for chest tube placement.

## 2019-06-22 NOTE — ED Notes (Signed)
Called Dr. Orvan Seen number again and reviewed with PA and will call MD and return call regarding PO status.

## 2019-06-22 NOTE — ED Notes (Signed)
ED TO INPATIENT HANDOFF REPORT  ED Nurse Name and Phone #: Britta Mccreedy RN  S Name/Age/Gender Kimberly Bryant 63 y.o. female Room/Bed: 038C/038C  Code Status   Code Status: Full Code  Home/SNF/Other Home Patient oriented to: self, place, time and situation Is this baseline? Yes   Triage Complete: Triage complete  Chief Complaint sob  Triage Note The pt speaks creole pointing to a mid-chest scar that appears to be fairly fresh  And she is rubbing that area as if in pain  The pt has some bloody drainage frfom under her lt breast no idea how long  The pt has soleaking sero sanguineous  Drainage from the lower incision  And from the drain marks  ckeaned with a dsd applied   Allergies Allergies  Allergen Reactions  . Carafate [Sucralfate] Swelling and Other (See Comments)    Patient stated it made her face swell (angioedema)- no breathing issues, however  . Lisinopril Swelling and Other (See Comments)    Patient stated it made her face swell (angioedema)- no breathing issues, however    Level of Care/Admitting Diagnosis ED Disposition    ED Disposition Condition Comment   Admit  Hospital Area: [facility] [[id number]]  Level of Care: Progressive [102]  Covid Evaluation: Confirmed COVID Negative  Diagnosis: Pleural effusion on left [865784]  Admitting Physician: Linden Dolin [[id number]]  Attending Physician: Linden Dolin [[id number]]  Estimated length of stay: 3 - 4 days  Certification:: I certify this patient will need inpatient services for at least 2 midnights  Bed request comments: 4E or 2C  PT Class (Do Not Modify): Inpatient [101]  PT Acc Code (Do Not Modify): Private [1]       B Medical/Surgery History Past Medical History:  Diagnosis Date  . CAD (coronary artery disease)   . Hyperlipidemia   . Hypertension    Past Surgical History:  Procedure Laterality Date  . CORONARY ARTERY BYPASS GRAFT N/A 06/10/2019   Procedure: CORONARY  ARTERY BYPASS GRAFTING (CABG) x Four, using left internal mammary artery and right leg greater saphenous vein harvested endoscopically;  Surgeon: Linden Dolin, MD;  Location: [facility] OR;  Service: Open Heart Surgery;  Laterality: N/A;  . LEFT HEART CATH AND CORONARY ANGIOGRAPHY N/A 06/09/2019   Procedure: LEFT HEART CATH AND CORONARY ANGIOGRAPHY;  Surgeon: Lennette Bihari, MD;  Location: [facility] INVASIVE CV LAB;  Service: Cardiovascular;  Laterality: N/A;  . TEE WITHOUT CARDIOVERSION N/A 06/10/2019   Procedure: TRANSESOPHAGEAL ECHOCARDIOGRAM (TEE);  Surgeon: Linden Dolin, MD;  Location: [facility] OR;  Service: Open Heart Surgery;  Laterality: N/A;     A IV Location/Drains/Wounds Patient Lines/Drains/Airways Status   Active Line/Drains/Airways    Name:   Placement date:   Placement time:   Site:   Days:   Peripheral IV 06/21/19 Left Antecubital   06/21/19    2122    Antecubital   1   Chest Tube 1 Left Pleural 28 Fr.   06/22/19    0320    Pleural   less than 1   Incision (Closed) 06/10/19 Chest Other (Comment)   06/10/19    0943     12   Incision (Closed) 06/10/19 Leg Right   06/10/19    0943     12          Intake/Output Last 24 hours  Intake/Output Summary (Last 24 hours) at 06/22/2019 1415 Last data filed at 06/22/2019 0353 Gross per 24 hour  Intake  100 ml  Output 750 ml  Net -650 ml    Labs/Imaging Results for orders placed or performed during the hospital encounter of 06/21/19 (from the past 48 hour(s))  Basic metabolic panel     Status: Abnormal   Collection Time: 06/21/19  4:25 PM  Result Value Ref Range   Sodium 136 135 - 145 mmol/L   Potassium 3.6 3.5 - 5.1 mmol/L   Chloride 103 98 - 111 mmol/L   CO2 21 (L) 22 - 32 mmol/L   Glucose, Bld 129 (H) 70 - 99 mg/dL   BUN 9 8 - 23 mg/dL   Creatinine, Ser 1.610.97 0.44 - 1.00 mg/dL   Calcium 8.5 (L) 8.9 - 10.3 mg/dL   GFR calc non Af Amer >60 >60 mL/min   GFR calc Af Amer >60 >60 mL/min   Anion gap 12 5 - 15    Comment: Performed at [facility], [address] [place], [place] [address]  CBC     Status: Abnormal   Collection Time: 06/21/19  4:25 PM  Result Value Ref Range   WBC 10.5 4.0 - 10.5 K/uL   RBC 3.14 (L) 3.87 - 5.11 MIL/uL   Hemoglobin 8.6 (L) 12.0 - 15.0 g/dL   HCT 54.028.1 (L) 98.136.0 - 19.146.0 %   MCV 89.5 80.0 - 100.0 fL   MCH 27.4 26.0 - 34.0 pg   MCHC 30.6 30.0 - 36.0 g/dL   RDW 47.813.7 29.511.5 - 62.115.5 %   Platelets 566 (H) 150 - 400 K/uL   nRBC 0.0 0.0 - 0.2 %    Comment: Performed at [facility], [address] [place], [place] [address]  Troponin I (High Sensitivity)     Status: Abnormal   Collection Time: 06/21/19  4:25 PM  Result Value Ref Range   Troponin I (High Sensitivity) 23 (H) <18 ng/L    Comment: (NOTE) Elevated high sensitivity troponin I (hsTnI) values and significant  changes across serial measurements may suggest ACS but many other  chronic and acute conditions are known to elevate hsTnI results.  Refer to the "Links" section for chest pain algorithms and additional  guidance. Performed at [facility], [address] [place], [place] [address]   Lactic acid, plasma     Status: None   Collection Time: 06/21/19  4:25 PM  Result Value Ref Range   Lactic Acid, Venous 1.4 0.5 - 1.9 mmol/L    Comment: Performed at [facility], [address] [place], [place] [address]  Lactic acid, plasma     Status: None   Collection Time: 06/21/19  8:33 PM  Result Value Ref Range   Lactic Acid, Venous 1.6 0.5 - 1.9 mmol/L    Comment: Performed at [facility], [address] [place], [place] [address]  Troponin I (High Sensitivity)     Status: Abnormal   Collection Time: 06/21/19  8:33 PM  Result Value Ref Range   Troponin I (High Sensitivity) 20 (H) <18 ng/L    Comment: (NOTE) Elevated high sensitivity troponin I (hsTnI) values and significant  changes across serial measurements may suggest ACS but many other  chronic and acute conditions are known to elevate hsTnI  results.  Refer to the "Links" section for chest pain algorithms and additional  guidance. Performed at [facility], [address] [place], [place] [address]   SARS Coronavirus 2 [facility](Hospital order, Performed in [facility] hospital lab) Nasopharyngeal  Nasopharyngeal Swab     Status: None   Collection Time: 06/21/19  9:10 PM   Specimen: Nasopharyngeal Swab  Result Value Ref Range   SARS Coronavirus 2 NEGATIVE NEGATIVE    Comment: (NOTE) If result is NEGATIVE SARS-CoV-2 target nucleic acids are NOT DETECTED. The SARS-CoV-2 RNA is generally detectable in upper and lower  respiratory specimens during the acute phase of infection. The lowest  concentration of SARS-CoV-2 viral copies this assay can detect is 250  copies / mL. A negative result does not preclude SARS-CoV-2 infection  and should not be used as the sole basis for treatment or other  patient management decisions.  A negative result may occur with  improper specimen collection / handling, submission of specimen other  than nasopharyngeal swab, presence of viral mutation(s) within the  areas targeted by this assay, and inadequate number of viral copies  (<250 copies / mL). A negative result must be combined with clinical  observations, patient history, and epidemiological information. If result is POSITIVE SARS-CoV-2 target nucleic acids are DETECTED. The SARS-CoV-2 RNA is generally detectable in upper and lower  respiratory specimens dur ing the acute phase of infection.  Positive  results are indicative of active infection with SARS-CoV-2.  Clinical  correlation with patient history and other diagnostic information is  necessary to determine patient infection status.  Positive results do  not rule out bacterial infection or co-infection with other viruses. If result is PRESUMPTIVE POSTIVE SARS-CoV-2 nucleic acids MAY BE PRESENT.   A presumptive positive result was obtained on the submitted specimen  and confirmed on  repeat testing.  While 2019 novel coronavirus  (SARS-CoV-2) nucleic acids may be present in the submitted sample  additional confirmatory testing may be necessary for epidemiological  and / or clinical management purposes  to differentiate between  SARS-CoV-2 and other Sarbecovirus currently known to infect humans.  If clinically indicated additional testing with an alternate test  methodology [phone]) is advised. The SARS-CoV-2 RNA is generally  detectable in upper and lower respiratory sp ecimens during the acute  phase of infection. The expected result is Negative. Fact Sheet for Patients:  StrictlyIdeas.no Fact Sheet for Healthcare Providers: BankingDealers.co.za This test is not yet approved or cleared by the [place] FDA and has been authorized for detection and/or diagnosis of SARS-CoV-2 by FDA under an Emergency Use Authorization (EUA).  This EUA will remain in effect (meaning this test can be used) for the duration of the COVID-19 declaration under Section 564(b)(1) of the Act, 21 U.S.C. section 360bbb-3(b)(1), unless the authorization is terminated or revoked sooner. Performed at [facility], [place] [address], [place], [place] [address]   Basic metabolic panel     Status: Abnormal   Collection Time: 06/22/19  2:40 AM  Result Value Ref Range   Sodium 137 135 - 145 mmol/L   Potassium 4.6 3.5 - 5.1 mmol/L   Chloride 108 98 - 111 mmol/L   CO2 18 (L) 22 - 32 mmol/L   Glucose, Bld 91 70 - 99 mg/dL   BUN 12 8 - 23 mg/dL   Creatinine, Ser 0.98 0.44 - 1.00 mg/dL   Calcium 8.4 (L) 8.9 - 10.3 mg/dL   GFR calc non Af Amer >60 >60 mL/min   GFR calc Af Amer >60 >60 mL/min   Anion gap 11 5 - 15    Comment: Performed at [facility], [place] [address] [place], [place] [address]  CBC     Status:  Abnormal   Collection Time: 06/22/19  2:40 AM  Result Value Ref Range   WBC 9.5 4.0 - 10.5 K/uL   RBC 3.08 (L) 3.87 - 5.11  MIL/uL   Hemoglobin 8.4 (L) 12.0 - 15.0 g/dL   HCT 40.929.3 (L) 81.136.0 - 91.446.0 %   MCV 95.1 80.0 - 100.0 fL   MCH 27.3 26.0 - 34.0 pg   MCHC 28.7 (L) 30.0 - 36.0 g/dL   RDW 78.213.8 95.611.5 - 21.315.5 %   Platelets 222 150 - 400 K/uL    Comment: REPEATED TO VERIFY SPECIMEN CHECKED FOR CLOTS PLATELET COUNT CONFIRMED BY SMEAR    nRBC 0.2 0.0 - 0.2 %    Comment: Performed at [facility], [address] [place], [place] [address]  C-reactive protein     Status: Abnormal   Collection Time: 06/22/19  3:44 AM  Result Value Ref Range   CRP 8.0 (H) <1.0 mg/dL    Comment: Performed at [facility], [address] [place], [place] [address]  CBC     Status: Abnormal   Collection Time: 06/22/19  3:51 AM  Result Value Ref Range   WBC 11.3 (H) 4.0 - 10.5 K/uL   RBC 2.98 (L) 3.87 - 5.11 MIL/uL   Hemoglobin 8.1 (L) 12.0 - 15.0 g/dL   HCT 29.526.5 (L) 28.436.0 - 13.246.0 %   MCV 88.9 80.0 - 100.0 fL   MCH 27.2 26.0 - 34.0 pg   MCHC 30.6 30.0 - 36.0 g/dL   RDW 44.013.8 10.211.5 - 72.515.5 %   Platelets 534 (H) 150 - 400 K/uL    Comment: REPEATED TO VERIFY RECOLLECTED SAMPLE CORRECTED ON 08/17 AT 0516: PREVIOUSLY REPORTED AS 534 REPEATED TO VERIFY RECOLLECT    nRBC 0.2 0.0 - 0.2 %    Comment: Performed at [facility], [address] [place], [place] [address]   Dg Chest 2 View  Result Date: 06/21/2019 CLINICAL DATA:  Chest pain.  Recent CABG. EXAM: CHEST - 2 VIEW COMPARISON:  Chest x-ray dated June 13, 2019. FINDINGS: Stable cardiomegaly status post CABG. Increased moderate to large left pleural effusion. Unchanged small right pleural effusion. New collapse/consolidation of the lingula and left lower lobe. No pneumothorax. No acute osseous abnormality. IMPRESSION: 1. Increased now moderate to large left pleural effusion with new collapse/consolidation of the lingula and left lower lobe. 2. Unchanged small right pleural effusion. Electronically Signed   By: Obie DredgeWilliam T Derry M.D.   On: 06/21/2019 17:01   Ct Chest  Wo Contrast  Result Date: 06/22/2019 CLINICAL DATA:  Mediastinitis after CABG EXAM: CT CHEST WITHOUT CONTRAST TECHNIQUE: Multidetector CT imaging of the chest was performed following the standard protocol without IV contrast. COMPARISON:  06/08/2019 FINDINGS: Cardiovascular: Cardiomegaly without pericardial effusion. Aortic and coronary atherosclerosis status post CABG. Mediastinum/Nodes: Mild mediastinal and subcutaneous stranding which is expected after surgery. No hematoma or collection noted. No gross para valvular mass/collection. Lungs/Pleura: Left-sided chest tube with trace pneumothorax. Dependent atelectasis, multi segment. Trace pleural fluid. Upper Abdomen: Negative Musculoskeletal: Unremarkable sternal osteotomy margins. Sternal wires are intact and in line. IMPRESSION: 1. No dehiscence or evidence of collection. 2. Multi segment atelectasis. Electronically Signed   By: Marnee SpringJonathon  Watts M.D.   On: 06/22/2019 04:24   Dg Chest Portable 1 View  Result Date: 06/22/2019 Linden DolinAtkins, Broadus Z, MD     06/22/2019  3:32 AM Chest Tube Insertion Procedure Note Indications:  Clinically significant Effusion Pre-operative Diagnosis: Effusion Post-operative Diagnosis: Effusion Procedure Details  Informed consent was obtained for the procedure, including sedation.  Risks of lung perforation, hemorrhage, arrhythmia, and adverse drug reaction were discussed. After sterile skin prep, using standard technique, a 28 French tube was placed in the left anterolateral 5th rib space. Findings: 700 ml of serosanguinous fluid obtained Estimated Blood Loss:  Minimal        Specimens:  None             Complications:  None; patient tolerated the procedure well.        Disposition: ED, stable, awaiting bed assignment        Condition: stable Attending Attestation: I performed the procedure.   Pending Labs Unresulted Labs (From admission, onward)   None      Vitals/Pain Today's Vitals   06/22/19 1315 06/22/19 1330 06/22/19  1345 06/22/19 1400  BP: 137/67 116/66 130/64 116/65  Pulse: 94 100 (!) 101 (!) 102  Resp: 16 20 (!) 22 (!) 26  Temp:      TempSrc:      SpO2: 98% 96% 95% 97%  Weight:      PainSc:        Isolation Precautions No active isolations  Medications Medications  aspirin EC tablet 81 mg (has no administration in time range)  amLODipine (NORVASC) tablet 10 mg (has no administration in time range)  atorvastatin (LIPITOR) tablet 80 mg (has no administration in time range)  pantoprazole (PROTONIX) EC tablet 40 mg (has no administration in time range)  traMADol (ULTRAM) tablet 50 mg (50 mg Oral Given 06/22/19 0856)  sodium chloride flush (NS) 0.9 % injection 3 mL (3 mLs Intravenous Given 06/21/19 2254)  acetaminophen (TYLENOL) tablet 650 mg (has no administration in time range)    Or  acetaminophen (TYLENOL) suppository 650 mg (has no administration in time range)  docusate sodium (COLACE) capsule 100 mg (has no administration in time range)  ondansetron (ZOFRAN) tablet 4 mg (has no administration in time range)    Or  ondansetron (ZOFRAN) injection 4 mg (has no administration in time range)  furosemide (LASIX) tablet 40 mg (has no administration in time range)  potassium chloride SA (K-DUR) CR tablet 20 mEq (has no administration in time range)  ceFEPIme (MAXIPIME) 2 g in sodium chloride 0.9 % 100 mL IVPB (0 g Intravenous Stopped 06/22/19 0724)  vancomycin (VANCOCIN) IVPB 1000 mg/200 mL premix (0 mg Intravenous Stopped 06/22/19 0045)  colchicine tablet 0.6 mg (has no administration in time range)  ibuprofen (ADVIL) tablet 400 mg (has no administration in time range)  metoprolol succinate (TOPROL-XL) 24 hr tablet 25 mg (has no administration in time range)  sodium chloride flush (NS) 0.9 % injection 3 mL (3 mLs Intravenous Given 06/21/19 2159)  lidocaine-EPINEPHrine (XYLOCAINE W/EPI) 2 %-1:200000 (PF) injection (  Given 06/22/19 0315)  fentaNYL (SUBLIMAZE) injection 50 mcg (50 mcg Intravenous  Given 06/22/19 0333)  furosemide (LASIX) injection 40 mg (40 mg Intravenous Given 06/22/19 0645)    Mobility walks Low fall risk   Focused Assessments Cardiac Assessment Handoff:  Cardiac Rhythm: Normal sinus rhythm No results found for: CKTOTAL, CKMB, CKMBINDEX, TROPONINI No results found for: DDIMER Does the Patient currently have chest pain? No      R Recommendations: See Admitting Provider Note  Report given to:   Additional Notes:

## 2019-06-22 NOTE — H&P (Signed)
[address]       [place] [address]             [phone]        Kimberly Bryant [place] Medical Record #[id number] Date of Birth: 1956/07/04  Referring: No ref. provider found Primary Care: Antony Blackbird, MD Primary Cardiologist:Traci Radford Pax, MD  Chief Complaint:    Chief Complaint  Patient presents with   Chest Pain    History of Present Illness:     63 yo lady presented with AMI in late July '20. 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 Underwent CABG and did well--discharged after approximately one week. Late last week, began draining slightly from lower portion of sternal wound. No fevers/chills; drainage was/is serous, not purulent or bloody. Drainage amount worsened and had increased difficulty breathing yesterday, so she presented to ED. CXR shows large left effusion and copious drainage from lower midline sternum. Hemodynamically stable; denies chest pain.    Current Activity/ Functional Status: Patient is independent with mobility/ambulation, transfers, ADL's, IADL's.   Zubrod Score: At the time of surgery this patients most appropriate activity status/level should be described as: []     0    Normal activity, no symptoms []     1    Restricted in physical strenuous activity but ambulatory, able to do out light work []     2    Ambulatory and capable of self care, unable to do work activities, up and about                 more than 50%  Of the time                            []     3    Only limited self care, in bed greater than 50% of waking hours []     4    Completely disabled, no self care, confined to bed or chair []     5    Moribund  Past Medical History:  Diagnosis Date   CAD (coronary artery disease)    Hyperlipidemia    Hypertension     Past Surgical History:  Procedure Laterality Date   CORONARY ARTERY BYPASS GRAFT N/A 06/10/2019   Procedure: CORONARY ARTERY BYPASS GRAFTING (CABG) x Four, using left internal mammary artery and right leg  greater saphenous vein harvested endoscopically;  Surgeon: Wonda Olds, MD;  Location: [facility];  Service: Open Heart Surgery;  Laterality: N/A;   LEFT HEART CATH AND CORONARY ANGIOGRAPHY N/A 06/09/2019   Procedure: LEFT HEART CATH AND CORONARY ANGIOGRAPHY;  Surgeon: Troy Sine, MD;  Location: [facility];  Service: Cardiovascular;  Laterality: N/A;   TEE WITHOUT CARDIOVERSION N/A 06/10/2019   Procedure: TRANSESOPHAGEAL ECHOCARDIOGRAM (TEE);  Surgeon: Wonda Olds, MD;  Location: [facility];  Service: Open Heart Surgery;  Laterality: N/A;    Social History   Tobacco Use  Smoking Status Never Smoker  Smokeless Tobacco Never Used    Social History   Substance and Sexual Activity  Alcohol Use Never   Frequency: Never     Allergies  Allergen Reactions   Carafate [Sucralfate] Swelling and Other (See Comments)    Patient stated it made her face swell (angioedema)- no breathing issues, however   Lisinopril Swelling and Other (See Comments)    Patient stated it made her face swell (angioedema)- no breathing issues, however    Current  Facility-Administered Medications  Medication Dose Route Frequency Provider Last Rate Last Dose   acetaminophen (TYLENOL) tablet 650 mg  650 mg Oral Q6H PRN Barrett, Erin R, PA-C       Or   acetaminophen (TYLENOL) suppository 650 mg  650 mg Rectal Q6H PRN Barrett, Erin R, PA-C       amLODipine (NORVASC) tablet 10 mg  10 mg Oral Daily Barrett, Erin R, PA-C       aspirin EC tablet 81 mg  81 mg Oral Daily Barrett, Erin R, PA-C       atorvastatin (LIPITOR) tablet 80 mg  80 mg Oral q1800 Barrett, Erin R, PA-C       ceFEPIme (MAXIPIME) 2 g in sodium chloride 0.9 % 100 mL IVPB  2 g Intravenous Q8H Gavin PoundWalston, Christopher J, RPH   Stopped at 06/22/19 [id number]   colchicine tablet 0.6 mg  0.6 mg Oral Daily Bettie Swavely, Merri BrunetteBroadus Z, MD       docusate sodium (COLACE) capsule 100 mg  100 mg Oral BID PRN Barrett, Erin R, PA-C       furosemide (LASIX) tablet  40 mg  40 mg Oral Daily Barrett, Erin R, PA-C       ibuprofen (ADVIL) tablet 400 mg  400 mg Oral TID Linden DolinAtkins, Khamari Sheehan Z, MD       metoprolol tartrate (LOPRESSOR) tablet 50 mg  50 mg Oral BID Barrett, Erin R, PA-C       ondansetron (ZOFRAN) tablet 4 mg  4 mg Oral Q6H PRN Barrett, Erin R, PA-C       Or   ondansetron (ZOFRAN) injection 4 mg  4 mg Intravenous Q6H PRN Barrett, Erin R, PA-C       pantoprazole (PROTONIX) EC tablet 40 mg  40 mg Oral BID AC Barrett, Erin R, PA-C       potassium chloride SA (K-DUR) CR tablet 20 mEq  20 mEq Oral Daily Barrett, Erin R, PA-C       sodium chloride flush (NS) 0.9 % injection 3 mL  3 mL Intravenous Q12H Barrett, Erin R, PA-C   3 mL at 06/21/19 2254   traMADol (ULTRAM) tablet 50 mg  50 mg Oral Q4H PRN Barrett, Erin R, PA-C       vancomycin (VANCOCIN) IVPB 1000 mg/200 mL premix  1,000 mg Intravenous Q24H Gavin PoundWalston, Christopher J, [facility] HealthRPH   Stopped at 06/22/19 0045   Current Outpatient Medications  Medication Sig Dispense Refill   amLODipine (NORVASC) 10 MG tablet Take 1 tablet (10 mg total) by mouth daily. 90 tablet 3   aspirin EC 81 MG EC tablet Take 1 tablet (81 mg total) by mouth daily.     atorvastatin (LIPITOR) 80 MG tablet Take 1 tablet (80 mg total) by mouth daily at 6 PM. 30 tablet 1   clopidogrel (PLAVIX) 75 MG tablet Take 1 tablet (75 mg total) by mouth daily. 30 tablet 1   lansoprazole (PREVACID) 30 MG capsule Take 1 capsule (30 mg total) by mouth 2 (two) times daily. To reduce stomach acid 60 capsule 2   metoprolol tartrate (LOPRESSOR) 50 MG tablet Take 1 tablet (50 mg total) by mouth 2 (two) times daily. 60 tablet 1   traMADol (ULTRAM) 50 MG tablet Take 1 tablet (50 mg total) by mouth every 6 (six) hours as needed for up to 7 days for moderate pain. 28 tablet 0    (Not in a hospital admission)   Family History  Problem Relation Age of Onset  Hypertension Mother      Review of Systems:   ROS A comprehensive review of systems  was negative except for: Respiratory: positive for dyspnea on exertion     Cardiac Review of Systems: Y or  [    ]= no  Chest Pain [ n   ]  Resting SOB [ y  ] Exertional SOB  [  ]  Orthopnea [  ]   Pedal Edema [ n  ]    Palpitations [  ] Syncope  [  ]   Presyncope [   ]  General Review of Systems: [Y] = yes [  ]=no Constitional: recent weight change [n  ]; anorexia [  ]; fatigue [  ]; nausea [  ]; night sweats [ n ]; fever [ n ]; or chills [ n ]                                                               Dental: Last Dentist visit:   Eye : blurred vision [  ]; diplopia [   ]; vision changes [  ];  Amaurosis fugax[  ]; Resp: cough [ n ];  wheezing[ n ];  hemoptysis[  ]; shortness of breath[  ]; paroxysmal nocturnal dyspnea[  ]; dyspnea on exertion[  ]; or orthopnea[  ];  GI:  gallstones[  ], vomiting[n  ];  dysphagia[  ]; melena[  ];  hematochezia [  ]; heartburn[n  ];   Hx of  Colonoscopy[  ]; GU: kidney stones [  ]; hematuria[ n ];   dysuria [n  ];  nocturia[  ];  history of     obstruction [  ]; urinary frequency [  ]             Skin: rash, swelling[  ];, hair loss[  ];  peripheral edema[  ];  or itching[  ]; Musculosketetal: myalgias[ n ];  joint swelling[n  ];  joint erythema[  ];  joint pain[  ];  back pain[  ];  Heme/Lymph: bruising[ n ];  bleeding[  ];  anemia[  ];  Neuro: TIA[ n ];  headaches[n  ];  stroke[  ];  vertigo[  ];  seizures[  ];   paresthesias[  ];  difficulty walking[  ];  Psych:depression[n  ]; anxiety[n  ];  Endocrine: diabetes[  ];  thyroid dysfunction[  ];                Physical Exam: BP 107/66    Pulse 88    Temp 98.4 F (36.9 C) (Oral)    Resp (!) 22    Wt 91.9 kg    SpO2 97%    BMI 39.57 kg/m    General appearance: alert, cooperative and mild distress Head: Normocephalic, without obvious abnormality, atraumatic Resp: diminished breath sounds posterior - left Cardio: regular rate and rhythm, S1, S2 normal, no murmur, click, rub or gallop GI: soft,  non-tender; bowel sounds normal; no masses,  no organomegaly Extremities: edema 2+ Neurologic: Alert and oriented X 3, normal strength and tone. Normal symmetric reflexes. Normal coordination and gait  Diagnostic Studies & Laboratory data:     Recent Radiology Findings:   Dg Chest 2 View  Result Date: 06/21/2019 CLINICAL DATA:  Chest  pain.  Recent CABG. EXAM: CHEST - 2 VIEW COMPARISON:  Chest x-ray dated June 13, 2019. FINDINGS: Stable cardiomegaly status post CABG. Increased moderate to large left pleural effusion. Unchanged small right pleural effusion. New collapse/consolidation of the lingula and left lower lobe. No pneumothorax. No acute osseous abnormality. IMPRESSION: 1. Increased now moderate to large left pleural effusion with new collapse/consolidation of the lingula and left lower lobe. 2. Unchanged small right pleural effusion. Electronically Signed   By: Obie DredgeWilliam T Derry M.D.   On: 06/21/2019 17:01   Ct Chest Wo Contrast  Result Date: 06/22/2019 CLINICAL DATA:  Mediastinitis after CABG EXAM: CT CHEST WITHOUT CONTRAST TECHNIQUE: Multidetector CT imaging of the chest was performed following the standard protocol without IV contrast. COMPARISON:  06/08/2019 FINDINGS: Cardiovascular: Cardiomegaly without pericardial effusion. Aortic and coronary atherosclerosis status post CABG. Mediastinum/Nodes: Mild mediastinal and subcutaneous stranding which is expected after surgery. No hematoma or collection noted. No gross para valvular mass/collection. Lungs/Pleura: Left-sided chest tube with trace pneumothorax. Dependent atelectasis, multi segment. Trace pleural fluid. Upper Abdomen: Negative Musculoskeletal: Unremarkable sternal osteotomy margins. Sternal wires are intact and in line. IMPRESSION: 1. No dehiscence or evidence of collection. 2. Multi segment atelectasis. Electronically Signed   By: Marnee SpringJonathon  Watts M.D.   On: 06/22/2019 04:24   Dg Chest Portable 1 View  Result Date:  06/22/2019 Linden DolinAtkins, Kimberly Kreager Z, MD     06/22/2019  3:32 AM Chest Tube Insertion Procedure Note Indications:  Clinically significant Effusion Pre-operative Diagnosis: Effusion Post-operative Diagnosis: Effusion Procedure Details Informed consent was obtained for the procedure, including sedation.  Risks of lung perforation, hemorrhage, arrhythmia, and adverse drug reaction were discussed. After sterile skin prep, using standard technique, a 28 French tube was placed in the left anterolateral 5th rib space. Findings: 700 ml of serosanguinous fluid obtained Estimated Blood Loss:  Minimal        Specimens:  None             Complications:  None; patient tolerated the procedure well.        Disposition: ED, stable, awaiting bed assignment        Condition: stable Attending Attestation: I performed the procedure. .ct I have independently reviewed the above radiologic studies and discussed with the patient   Recent Lab Findings: Lab Results  Component Value Date   WBC 11.3 (H) 06/22/2019   HGB 8.1 (L) 06/22/2019   HCT 26.5 (L) 06/22/2019   PLT 534 (H) 06/22/2019   GLUCOSE 91 06/22/2019   CHOL 177 12/10/2018   TRIG 68 12/10/2018   HDL 54 12/10/2018   LDLCALC 109 (H) 12/10/2018   ALT 22 06/08/2019   AST 24 06/08/2019   NA 137 06/22/2019   K 4.6 06/22/2019   CL 108 06/22/2019   CREATININE 0.98 06/22/2019   BUN 12 06/22/2019   CO2 18 (L) 06/22/2019   INR 1.5 (H) 06/10/2019   HGBA1C 5.9 (H) 06/09/2019      Assessment / Plan:      Large left effusion by CXR, drained with chest tube as noted; f/u CT chest shows mild residual atelectasis. Will admit for diuresis, wound management, chest tube management.     I  spent 30 minutes counseling the patient face to face.   Eleno Weimar Z. Vickey SagesAtkins, MD 06/22/2019 7:49 AM

## 2019-06-22 NOTE — ED Notes (Signed)
Pt assessment with interpreter.  C/o moderate pain to midsternum.  Reviewed that we will start with a tablet and if needed give more.   Pt explained call light system, POC and wait time.  No questions at this time.  Pt restful without any pain responses, also denies nausea.  Chest tube in place and attached to low suction.  1200 cc red/ serousanquinous fluid.  Recent drainage more red in color.

## 2019-06-22 NOTE — ED Notes (Signed)
Pt appears uncomfortable, assessment done using interpreter pain improved and pt denies any needs.  Would like to get up and walk around.   Reviewed that she has a chest tube and for now should remain on bedrest.

## 2019-06-22 NOTE — ED Notes (Signed)
Attempted report 

## 2019-06-22 NOTE — ED Notes (Signed)
Family member here at this time.  Pt requests food and drink.  Message sent to MD.  Current orders NPO.

## 2019-06-23 ENCOUNTER — Inpatient Hospital Stay (HOSPITAL_COMMUNITY): Payer: 59 | Admitting: Certified Registered Nurse Anesthetist

## 2019-06-23 ENCOUNTER — Encounter (HOSPITAL_COMMUNITY): Payer: Self-pay | Admitting: Surgery

## 2019-06-23 ENCOUNTER — Encounter (HOSPITAL_COMMUNITY)
Admission: EM | Disposition: A | Discharge status: Skilled Nursing Facility | Payer: Self-pay | Source: Home / Self Care | Attending: Cardiothoracic Surgery

## 2019-06-23 ENCOUNTER — Inpatient Hospital Stay (HOSPITAL_COMMUNITY): Payer: 59

## 2019-06-23 DIAGNOSIS — I9789 Other postprocedural complications and disorders of the circulatory system, not elsewhere classified: Secondary | ICD-10-CM

## 2019-06-23 HISTORY — PX: STERNAL WOUND DEBRIDEMENT: SHX1058

## 2019-06-23 HISTORY — PX: APPLICATION OF WOUND VAC: SHX5189

## 2019-06-23 LAB — CBC WITH DIFFERENTIAL/PLATELET
Abs Immature Granulocytes: 0.06 10*3/uL (ref 0.00–0.07)
Basophils Absolute: 0 10*3/uL (ref 0.0–0.1)
Basophils Relative: 0 %
Eosinophils Absolute: 0.3 10*3/uL (ref 0.0–0.5)
Eosinophils Relative: 3 %
HCT: 25.1 % — ABNORMAL LOW (ref 36.0–46.0)
Hemoglobin: 7.7 g/dL — ABNORMAL LOW (ref 12.0–15.0)
Immature Granulocytes: 1 %
Lymphocytes Relative: 25 %
Lymphs Abs: 2.7 10*3/uL (ref 0.7–4.0)
MCH: 27.4 pg (ref 26.0–34.0)
MCHC: 30.7 g/dL (ref 30.0–36.0)
MCV: 89.3 fL (ref 80.0–100.0)
Monocytes Absolute: 0.9 10*3/uL (ref 0.1–1.0)
Monocytes Relative: 8 %
Neutro Abs: 7 10*3/uL (ref 1.7–7.7)
Neutrophils Relative %: 63 %
Platelets: 525 10*3/uL — ABNORMAL HIGH (ref 150–400)
RBC: 2.81 MIL/uL — ABNORMAL LOW (ref 3.87–5.11)
RDW: 13.6 % (ref 11.5–15.5)
WBC: 11 10*3/uL — ABNORMAL HIGH (ref 4.0–10.5)
nRBC: 0 % (ref 0.0–0.2)

## 2019-06-23 LAB — BASIC METABOLIC PANEL
Anion gap: 7 (ref 5–15)
BUN: 19 mg/dL (ref 8–23)
CO2: 24 mmol/L (ref 22–32)
Calcium: 8.4 mg/dL — ABNORMAL LOW (ref 8.9–10.3)
Chloride: 107 mmol/L (ref 98–111)
Creatinine, Ser: 1.25 mg/dL — ABNORMAL HIGH (ref 0.44–1.00)
GFR calc Af Amer: 53 mL/min — ABNORMAL LOW (ref >60)
GFR calc non Af Amer: 46 mL/min — ABNORMAL LOW (ref >60)
Glucose, Bld: 105 mg/dL — ABNORMAL HIGH (ref 70–99)
Potassium: 4.1 mmol/L (ref 3.5–5.1)
Sodium: 138 mmol/L (ref 135–145)

## 2019-06-23 LAB — MAGNESIUM: Magnesium: 2.1 mg/dL (ref 1.7–2.4)

## 2019-06-23 SURGERY — DEBRIDEMENT, WOUND, STERNUM
Anesthesia: General

## 2019-06-23 MED ORDER — MEPERIDINE HCL 25 MG/ML IJ SOLN: 6.2500 mg | INTRAMUSCULAR | Status: DC | PRN

## 2019-06-23 MED ORDER — SUGAMMADEX SODIUM 200 MG/2ML IV SOLN
INTRAVENOUS | Status: DC | PRN
  Administered 2019-06-23: 200 mg via INTRAVENOUS

## 2019-06-23 MED ORDER — PROMETHAZINE HCL 25 MG/ML IJ SOLN
6.2500 mg | INTRAMUSCULAR | Status: DC | PRN
  Administered 2019-06-23: 6.25 mg via INTRAVENOUS

## 2019-06-23 MED ORDER — FENTANYL CITRATE (PF) 100 MCG/2ML IJ SOLN: 25.0000 ug | INTRAMUSCULAR | Status: DC | PRN

## 2019-06-23 MED ORDER — VANCOMYCIN HCL IN DEXTROSE 750-5 MG/150ML-% IV SOLN
750.0000 mg | INTRAVENOUS | Status: DC
  Administered 2019-06-23: 750 mg via INTRAVENOUS
  Filled 2019-06-23: qty 150

## 2019-06-23 MED ORDER — FENTANYL CITRATE (PF) 100 MCG/2ML IJ SOLN
INTRAMUSCULAR | Status: DC | PRN
  Administered 2019-06-23: 100 ug via INTRAVENOUS

## 2019-06-23 MED ORDER — FENTANYL CITRATE (PF) 250 MCG/5ML IJ SOLN
INTRAMUSCULAR | Status: AC
  Filled 2019-06-23: qty 10

## 2019-06-23 MED ORDER — OXYCODONE-ACETAMINOPHEN 5-325 MG PO TABS
1.0000 | ORAL_TABLET | ORAL | Status: DC | PRN
  Administered 2019-06-25: 2 via ORAL
  Administered 2019-06-25 (×2): 1 via ORAL
  Administered 2019-06-25: 2 via ORAL
  Administered 2019-06-26 – 2019-06-30 (×2): 1 via ORAL
  Filled 2019-06-23: qty 1
  Filled 2019-06-23 (×2): qty 2
  Filled 2019-06-23 (×3): qty 1

## 2019-06-23 MED ORDER — MORPHINE SULFATE (PF) 2 MG/ML IV SOLN
2.0000 mg | INTRAVENOUS | Status: DC | PRN
  Administered 2019-06-23 – 2019-07-01 (×7): 2 mg via INTRAVENOUS
  Filled 2019-06-23 (×7): qty 1

## 2019-06-23 MED ORDER — ROCURONIUM BROMIDE 50 MG/5ML IV SOSY
PREFILLED_SYRINGE | INTRAVENOUS | Status: DC | PRN
  Administered 2019-06-23: 50 mg via INTRAVENOUS

## 2019-06-23 MED ORDER — MIDAZOLAM HCL 2 MG/2ML IJ SOLN: 0.5000 mg | Freq: Once | INTRAMUSCULAR | Status: DC | PRN

## 2019-06-23 MED ORDER — PHENYLEPHRINE HCL (PRESSORS) 10 MG/ML IV SOLN
INTRAVENOUS | Status: DC | PRN
  Administered 2019-06-23 (×2): 100 ug via INTRAVENOUS
  Administered 2019-06-23: 120 ug via INTRAVENOUS

## 2019-06-23 MED ORDER — LACTATED RINGERS IV SOLN
INTRAVENOUS | Status: DC | PRN
  Administered 2019-06-23: 18:00:00 via INTRAVENOUS

## 2019-06-23 MED ORDER — PROPOFOL 10 MG/ML IV BOLUS
INTRAVENOUS | Status: AC
  Filled 2019-06-23: qty 40

## 2019-06-23 MED ORDER — DEXAMETHASONE SODIUM PHOSPHATE 4 MG/ML IJ SOLN
INTRAMUSCULAR | Status: DC | PRN
  Administered 2019-06-23: 10 mg via INTRAVENOUS

## 2019-06-23 MED ORDER — SODIUM CHLORIDE 0.9 % IV SOLN
2.0000 g | Freq: Two times a day (BID) | INTRAVENOUS | Status: DC
  Administered 2019-06-23: 23:00:00 2 g via INTRAVENOUS
  Filled 2019-06-23 (×3): qty 2

## 2019-06-23 MED ORDER — SODIUM CHLORIDE 0.9 % IR SOLN
Status: DC | PRN
  Administered 2019-06-23: 3000 mL

## 2019-06-23 MED ORDER — MIDAZOLAM HCL 2 MG/2ML IJ SOLN
INTRAMUSCULAR | Status: AC
  Filled 2019-06-23: qty 2

## 2019-06-23 MED ORDER — LIDOCAINE 2% (20 MG/ML) 5 ML SYRINGE
INTRAMUSCULAR | Status: DC | PRN
  Administered 2019-06-23: 30 mg via INTRAVENOUS

## 2019-06-23 MED ORDER — PROMETHAZINE HCL 25 MG/ML IJ SOLN
INTRAMUSCULAR | Status: AC
  Filled 2019-06-23: qty 1

## 2019-06-23 MED ORDER — PROPOFOL 10 MG/ML IV BOLUS
INTRAVENOUS | Status: DC | PRN
  Administered 2019-06-23: 100 mg via INTRAVENOUS

## 2019-06-23 SURGICAL SUPPLY — 57 items
ATTRACTOMAT 16X20 MAGNETIC DRP (DRAPES) ×1 IMPLANT
BAG DECANTER FOR FLEXI CONT (MISCELLANEOUS) ×1 IMPLANT
BENZOIN TINCTURE PRP APPL 2/3 (GAUZE/BANDAGES/DRESSINGS) IMPLANT
BLADE CLIPPER SURG (BLADE) ×1 IMPLANT
BLADE SURG 10 STRL SS (BLADE) ×2 IMPLANT
BNDG GAUZE ELAST 4 BULKY (GAUZE/BANDAGES/DRESSINGS) IMPLANT
CANISTER SUCT 3000ML PPV (MISCELLANEOUS) ×2 IMPLANT
CANISTER WOUNDNEG PRESSURE 500 (CANNISTER) ×1 IMPLANT
CATH THORACIC 28FR RT ANG (CATHETERS) IMPLANT
CATH THORACIC 36FR (CATHETERS) IMPLANT
CATH THORACIC 36FR RT ANG (CATHETERS) IMPLANT
CLIP VESOCCLUDE SM WIDE 24/CT (CLIP) IMPLANT
CONN Y 3/8X3/8X3/8  BEN (MISCELLANEOUS)
CONN Y 3/8X3/8X3/8 BEN (MISCELLANEOUS) ×1 IMPLANT
CONT SPEC 4OZ CLIKSEAL STRL BL (MISCELLANEOUS) IMPLANT
COVER WAND RF STERILE (DRAPES) ×1 IMPLANT
DRAPE LAPAROSCOPIC ABDOMINAL (DRAPES) ×2 IMPLANT
DRSG PAD ABDOMINAL 8X10 ST (GAUZE/BANDAGES/DRESSINGS) ×3 IMPLANT
DRSG VAC ATS SM SENSATRAC (GAUZE/BANDAGES/DRESSINGS) ×1 IMPLANT
ELECT REM PT RETURN 9FT ADLT (ELECTROSURGICAL) ×2
ELECTRODE REM PT RTRN 9FT ADLT (ELECTROSURGICAL) ×1 IMPLANT
GAUZE SPONGE 4X4 12PLY STRL (GAUZE/BANDAGES/DRESSINGS) ×1 IMPLANT
GAUZE XEROFORM 5X9 LF (GAUZE/BANDAGES/DRESSINGS) IMPLANT
GOWN STRL REUS W/ TWL LRG LVL3 (GOWN DISPOSABLE) ×4 IMPLANT
GOWN STRL REUS W/TWL LRG LVL3 (GOWN DISPOSABLE) ×2
HANDPIECE INTERPULSE COAX TIP (DISPOSABLE) ×1
HEMOSTAT POWDER SURGIFOAM 1G (HEMOSTASIS) IMPLANT
HEMOSTAT SURGICEL 2X14 (HEMOSTASIS) ×1 IMPLANT
KIT BASIN OR (CUSTOM PROCEDURE TRAY) ×2 IMPLANT
KIT SUCTION CATH 14FR (SUCTIONS) IMPLANT
KIT TURNOVER KIT B (KITS) ×2 IMPLANT
NS IRRIG 1000ML POUR BTL (IV SOLUTION) ×2 IMPLANT
PACK CHEST (CUSTOM PROCEDURE TRAY) ×2 IMPLANT
PAD ARMBOARD 7.5X6 YLW CONV (MISCELLANEOUS) ×4 IMPLANT
PIN SAFETY STERILE (MISCELLANEOUS) IMPLANT
RUBBERBAND STERILE (MISCELLANEOUS) IMPLANT
SET HNDPC FAN SPRY TIP SCT (DISPOSABLE) IMPLANT
SOL PREP POV-IOD 4OZ 10% (MISCELLANEOUS) ×1 IMPLANT
SPONGE LAP 18X18 RF (DISPOSABLE) ×1 IMPLANT
STAPLER VISISTAT 35W (STAPLE) IMPLANT
STRAP MONTGOMERY 1.25X11-1/8 (MISCELLANEOUS) IMPLANT
SUT ETHILON 3 0 FSL (SUTURE) IMPLANT
SUT MNCRL AB 4-0 PS2 18 (SUTURE) IMPLANT
SUT PDS AB 1 CTX 36 (SUTURE) ×2 IMPLANT
SUT STEEL 6MS V (SUTURE) IMPLANT
SUT STEEL STERNAL CCS#1 18IN (SUTURE) IMPLANT
SUT STEEL SZ 6 DBL 3X14 BALL (SUTURE) IMPLANT
SUT VIC AB 1 CTX 36 (SUTURE)
SUT VIC AB 1 CTX36XBRD ANBCTR (SUTURE) IMPLANT
SUT VIC AB 2-0 CTX 27 (SUTURE) IMPLANT
SUT VIC AB 3-0 X1 27 (SUTURE) IMPLANT
SWAB COLLECTION DEVICE MRSA (MISCELLANEOUS) ×1 IMPLANT
SWAB CULTURE ESWAB REG 1ML (MISCELLANEOUS) ×1 IMPLANT
SYSTEM SAHARA CHEST DRAIN ATS (WOUND CARE) ×1 IMPLANT
TOWEL GREEN STERILE (TOWEL DISPOSABLE) ×2 IMPLANT
TOWEL GREEN STERILE FF (TOWEL DISPOSABLE) ×2 IMPLANT
WATER STERILE IRR 1000ML POUR (IV SOLUTION) ×2 IMPLANT

## 2019-06-23 NOTE — Progress Notes (Signed)
Dr Orvan Seen in patient room with translator discussing prcedure.

## 2019-06-23 NOTE — Plan of Care (Signed)
  Problem: Education: Goal: Knowledge of General Education information will improve Description: Including pain rating scale, medication(s)/side effects and non-pharmacologic comfort measures 06/23/2019 0553 by Marcie Mowers, RN Outcome: Progressing 06/23/2019 0552 by Marcie Mowers, RN Outcome: Progressing   Problem: Activity: Goal: Risk for activity intolerance will decrease Outcome: Progressing   Problem: Nutrition: Goal: Adequate nutrition will be maintained Outcome: Progressing   Problem: Pain Managment: Goal: General experience of comfort will improve Outcome: Progressing   Problem: Safety: Goal: Ability to remain free from injury will improve Outcome: Progressing   Problem: Skin Integrity: Goal: Risk for impaired skin integrity will decrease Outcome: Progressing

## 2019-06-23 NOTE — Progress Notes (Signed)
      [address]       [place] [address]             [phone]         Subjective: She feels okay this morning. No SOB. No chest pain but she does have some pain around the chest tube site on the left side. Interpreter used during the interview and exam.   Objective: Vital signs in last 24 hours: Temp:  [98.2 F (36.8 C)-98.9 F (37.2 C)] 98.3 F (36.8 C) (08/18 0300) Pulse Rate:  [84-102] 85 (08/18 0300) Cardiac Rhythm: Normal sinus rhythm;Ventricular tachycardia (08/17 2216) Resp:  [16-30] 17 (08/18 0300) BP: (100-137)/(57-74) 118/73 (08/18 0300) SpO2:  [95 %-99 %] 99 % (08/18 0300) Weight:  [87.7 kg-88 kg] 87.7 kg (08/18 0300)     Intake/Output from previous day: 08/17 0701 - 08/18 0700 In: 403 [I.V.:3; IV Piggyback:400] Out: 120 [Chest Tube:120] Intake/Output this shift: No intake/output data recorded.  General appearance: alert, cooperative and no distress Heart: regular rate and rhythm, S1, S2 normal, no murmur, click, rub or gallop Lungs: clear to auscultation bilaterally and diminished in bilateral lower lobes Abdomen: soft, non-tender; bowel sounds normal; no masses,  no organomegaly Extremities: extremities normal, atraumatic, no cyanosis or edema Wound: clean and dry. There is an area at the lower sternotomy which has some purulent drainage.    Lab Results: Recent Labs    06/22/19 0351 06/23/19 0228  WBC 11.3* 11.0*  HGB 8.1* 7.7*  HCT 26.5* 25.1*  PLT 534* 525*   BMET:  Recent Labs    06/22/19 0240 06/23/19 0228  NA 137 138  K 4.6 4.1  CL 108 107  CO2 18* 24  GLUCOSE 91 105*  BUN 12 19  CREATININE 0.98 1.25*  CALCIUM 8.4* 8.4*    PT/INR: No results for input(s): LABPROT, INR in the last 72 hours. ABG    Component Value Date/Time   PHART 7.312 (L) 06/10/2019 1947   HCO3 23.1 06/10/2019 1947   TCO2 22 06/10/2019 2014   ACIDBASEDEF 3.0 (H) 06/10/2019 1947   O2SAT 97.0 06/10/2019 1947   CBG (last 3)  No results  for input(s): GLUCAP in the last 72 hours.  Assessment/Plan: S/P CABG and discharged about 1 week ago. Readmitted for a large left effusion. Chest tube placement on 8/17.  1. Chest tube-120cc recorded in 24 hours. No airleak. 1150cc total in atrium since placement. CXR showed: left basilar atelectasis and tiny left apical pneumothorax. Improved aeration of the right lung.  2. Hemodynamically stable and in NSR. BP well controlled.  3. Tolerating 2L [place] with good oxygenation 4. Sternal drainage- 5. H and H- 7.7/25.1, expected acute blood loss anemia. Will trend closely. 6. Creatinine is 1.25 today. Continue daily lasix for fluid overload.  7. Abx-Tolerating Vancomycin and Maxipime. WBC 11.0. No fevers.    Plan: Spoke with Dr. Orvan Seen regarding the plan of care. Plan is to take the patient to the OR later today and open the distal aspect of her incision. Then debride the area and place a wound vac. Per Dr. Orvan Seen she can go home with the vac.  Wound vac placement explained to the patient using interrupter. Patient seemed unsure about the surgery and planned to think over/talk to family.    LOS: 2 days    Elgie Collard 06/23/2019

## 2019-06-23 NOTE — Anesthesia Preprocedure Evaluation (Addendum)
Anesthesia Evaluation  Patient identified by MRN, date of birth, ID band Patient awake    Reviewed: Allergy & Precautions, NPO status , Patient's Chart, lab work & pertinent test results, reviewed documented beta blocker date and time   History of Anesthesia Complications Negative for: history of anesthetic complications  Airway Mallampati: II  TM Distance: >3 FB Neck ROM: Full    Dental  (+) Poor Dentition, Chipped, Dental Advisory Given, Missing   Pulmonary  06/21/2019 SARS coronavirus NEG L pleural effusion   breath sounds clear to auscultation       Cardiovascular hypertension, Pt. on medications and Pt. on home beta blockers (-) angina+ CAD and + CABG (06/10/2019)   Rhythm:Regular Rate:Normal  Normal LVF coming off of CPB 06/10/2019   Neuro/Psych negative neurological ROS     GI/Hepatic Neg liver ROS, GERD  Poorly Controlled,  Endo/Other  obese  Renal/GU Renal InsufficiencyRenal disease     Musculoskeletal   Abdominal (+) + obese,   Peds  Hematology  (+) Blood dyscrasia (Hb 7.7, INR 1.5), anemia , plavix   Anesthesia Other Findings   Reproductive/Obstetrics                            Anesthesia Physical Anesthesia Plan  ASA: III  Anesthesia Plan: General   Post-op Pain Management:    Induction: Intravenous  PONV Risk Score and Plan: 3 and Ondansetron and Dexamethasone  Airway Management Planned: Oral ETT  Additional Equipment:   Intra-op Plan:   Post-operative Plan: Extubation in OR  Informed Consent: I have reviewed the patients History and Physical, chart, labs and discussed the procedure including the risks, benefits and alternatives for the proposed anesthesia with the patient or authorized representative who has indicated his/her understanding and acceptance.     Dental advisory given  Plan Discussed with: Surgeon and CRNA  Anesthesia Plan Comments: (Discussed  with patient through [employer] translator)        Anesthesia Quick Evaluation

## 2019-06-23 NOTE — Anesthesia Procedure Notes (Signed)
Procedure Name: Intubation Date/Time: 06/23/2019 5:45 PM Performed by: Eligha Bridegroom, CRNA Pre-anesthesia Checklist: Patient identified, Emergency Drugs available, Suction available and Patient being monitored Patient Re-evaluated:Patient Re-evaluated prior to induction Oxygen Delivery Method: Circle system utilized Preoxygenation: Pre-oxygenation with 100% oxygen Induction Type: IV induction Ventilation: Mask ventilation without difficulty Laryngoscope Size: Mac and 4 Grade View: Grade II Tube type: Oral Tube size: 7.0 mm Number of attempts: 1 Airway Equipment and Method: Stylet Placement Confirmation: ETT inserted through vocal cords under direct vision and positive ETCO2 Secured at: 22 cm Tube secured with: Tape Dental Injury: Teeth and Oropharynx as per pre-operative assessment

## 2019-06-23 NOTE — Progress Notes (Signed)
Patient via transport to Xray

## 2019-06-23 NOTE — Progress Notes (Signed)
Patient has not made any urine overnight. Bladder scan showing 390 ml. Creatinine also up to 1.25 from 0.98 yesterday. TCTS paged. Order received to straight cath once.

## 2019-06-23 NOTE — Brief Op Note (Signed)
06/23/2019  6:47 PM  PATIENT:  Kimberly Bryant  63 y.o. female  PRE-OPERATIVE DIAGNOSIS:  STERNAL WOUND INFECTION  POST-OPERATIVE DIAGNOSIS:  STERNAL WOUND INFECTION  PROCEDURE:  Procedure(s): STERNAL WOUND DEBRIDEMENT (N/A) APPLICATION OF WOUND VAC (N/A)  SURGEON:  Surgeon(s) and Role:    * Wonda Olds, MD - Primary  PHYSICIAN ASSISTANT:   ASSISTANTS: none   ANESTHESIA:   general  EBL:  0 mL   BLOOD ADMINISTERED:none  DRAINS: wound vac   LOCAL MEDICATIONS USED:  NONE  SPECIMEN:  Source of Specimen:  sternal swab  DISPOSITION OF SPECIMEN:  lab for gram stain, c/s  COUNTS:  YES  TOURNIQUET:  * No tourniquets in log *  DICTATION: .Note written in EPIC  PLAN OF CARE: Admit to inpatient   PATIENT DISPOSITION:  PACU - hemodynamically stable.   Delay start of Pharmacological VTE agent (>24hrs) due to surgical blood loss or risk of bleeding: yes

## 2019-06-23 NOTE — Progress Notes (Signed)
Assessment completed with interpreter.  Patient questions answered.  Sternal dressing was changed by Nicholes Rough PA.  Little drainage on the gauze but foul infected smell in the room and when standing close to patient

## 2019-06-23 NOTE — Transfer of Care (Signed)
Immediate Anesthesia Transfer of Care Note  Patient: Kimberly Bryant  Procedure(s) Performed: STERNAL WOUND DEBRIDEMENT (N/A ) APPLICATION OF WOUND VAC (N/A )  Patient Location: PACU  Anesthesia Type:General  Level of Consciousness: drowsy  Airway & Oxygen Therapy: Patient Spontanous Breathing and Patient connected to nasal cannula oxygen  Post-op Assessment: Report given to RN and Post -op Vital signs reviewed and stable  Post vital signs: Reviewed and stable  Last Vitals:  Vitals Value Taken Time  BP 145/101 06/23/19 1839  Temp    Pulse 86 06/23/19 1841  Resp 19 06/23/19 1841  SpO2 92 % 06/23/19 1841  Vitals shown include unvalidated device data.  Last Pain:  Vitals:   06/23/19 1305  TempSrc: Oral  PainSc:          Complications: No apparent anesthesia complications

## 2019-06-23 NOTE — Progress Notes (Signed)
Patient via bed to OR for debriding and wound vac

## 2019-06-23 NOTE — Progress Notes (Signed)
Pharmacy Antibiotic Note  Kimberly Bryant is a 63 y.o. female s/p CABG in 05/2019 noted with chest incision site drainage now s/p chest tube placement.   Pharmacy has been consulted for Vancomycin and Cefepime dosing for wound infection, -WBC= 11, afeb, SCr= 1.25, CrCL ~ 50    Plan: -Change vancomycin to 750mg  IV q24h (estimated AUC= 478) -Change cefepime to 2gm IV q12h -Will follow renal function, cultures and clinical progress   Height: 5\' 3"  (160 cm)(Pt doesn't know estimated) Weight: 193 lb 5.5 oz (87.7 kg) IBW/kg (Calculated) : 52.4  Temp (24hrs), Avg:98.4 F (36.9 C), Min:98.2 F (36.8 C), Max:98.9 F (37.2 C)  Recent Labs  Lab 06/21/19 1625 06/21/19 2033 06/22/19 0240 06/22/19 0351 06/23/19 0228  WBC 10.5  --  9.5 11.3* 11.0*  CREATININE 0.97  --  0.98  --  1.25*  LATICACIDVEN 1.4 1.6  --   --   --     Estimated Creatinine Clearance: 49 mL/min (A) (by C-G formula based on SCr of 1.25 mg/dL (H)).    Allergies  Allergen Reactions  . Carafate [Sucralfate] Swelling and Other (See Comments)    Patient stated it made her face swell (angioedema)- no breathing issues, however  . Lisinopril Swelling and Other (See Comments)    Patient stated it made her face swell (angioedema)- no breathing issues, however    Antimicrobials this admission: 8/16 Vancomycin>> 8/16 Cefepime>>  Dose adjustments this admission: N/A  Microbiology results: None  Thank you for allowing pharmacy to be a part of this patient's care.  Kimberly Bryant, PharmD Clinical Pharmacist **Pharmacist phone directory can now be found on [email or web address] ([email or web address]).  Listed under [name].

## 2019-06-23 NOTE — H&P (Signed)
History and Physical Interval Note:  06/23/2019 5:27 PM  Kimberly Bryant  has presented today for surgery, with the diagnosis of STERNAL WOUND INFECTION.  The various methods of treatment have been discussed with the patient and family. After consideration of risks, benefits and other options for treatment, the patient has consented to  Procedure(s): STERNAL WOUND DEBRIDEMENT (N/A) APPLICATION OF WOUND VAC (N/A) as a surgical intervention.  The patient's history has been reviewed, patient examined, no change in status, stable for surgery.  I have reviewed the patient's chart and labs.  Questions were answered to the patient's satisfaction.     Wonda Olds, MD [phone]

## 2019-06-23 NOTE — Op Note (Signed)
Procedure(s): STERNAL WOUND DEBRIDEMENT APPLICATION OF WOUND VAC Procedure Note  Kimberly Bryant female 63 y.o. 06/23/2019  Procedure(s) and Anesthesia Type:    * STERNAL WOUND DEBRIDEMENT - General    * APPLICATION OF WOUND VAC - General  Surgeon(s) and Role:    * Wonda Olds, MD - Primary   Indications: The patient was admitted to the hospital with increased wound drainage s/p CABG. The patient now presents for sternal wound examination and debridement after discussing therapeutic alternatives.        Surgeon: Wonda Olds   Assistants: none  Anesthesia: General endotracheal anesthesia  ASA Class: 3    Procedure Detail  STERNAL WOUND DEBRIDEMENT, APPLICATION OF WOUND VAC After informed consent, the patient was brought to the operating room at [facility] on 06/23/2019.  He was placed in the supine position on the operating table and anesthesia was begun with a general endotracheal technique. After confirming adequate level of anesthesia, the anterior chest was prepped and draped in sterile field using Betadine paint and soap.  A preop surgical pause was performed.  The lower aspect of the sternal wound was probed with a surgical forceps.  This allowed passage into the deeper tissues there was a small area of stitch abscess material.  The remainder of the wound was viable and healthy.  Swabs of the wound bed were sent for Gram stain and culture.  Copious irrigation was undertaken followed by placement of a wound VAC measuring approximately 4 cm in depth, and 2 cm in length.  A VAC drape was then placed.  This concluded the procedure.  Findings: Small area of focal dehiscence.  Estimated Blood Loss:  Minimal         Drains: Wound VAC         Total IV Fluids: Per anesthesia  Blood Given: none          Specimens: Wound swab         Implants: none        Complications:  * No complications entered in OR log *         Disposition: PACU -  hemodynamically stable.         Condition: stable

## 2019-06-23 NOTE — Anesthesia Postprocedure Evaluation (Signed)
Anesthesia Post Note  Patient: Kimberly Bryant  Procedure(s) Performed: STERNAL WOUND DEBRIDEMENT (N/A ) APPLICATION OF WOUND VAC (N/A )     Patient location during evaluation: PACU Anesthesia Type: General Level of consciousness: awake and alert, oriented and patient cooperative Pain management: pain level controlled Vital Signs Assessment: post-procedure vital signs reviewed and stable Respiratory status: spontaneous breathing, nonlabored ventilation, respiratory function stable and patient connected to nasal cannula oxygen Cardiovascular status: blood pressure returned to baseline and stable Postop Assessment: no apparent nausea or vomiting Anesthetic complications: no    Last Vitals:  Vitals:   06/23/19 1845 06/23/19 1855  BP:  104/62  Pulse: 84 82  Resp: 17 15  Temp:    SpO2: 91% 100%    Last Pain:  Vitals:   06/23/19 1840  TempSrc:   PainSc: 0-No pain                 Micki Cassel,E. Keisha Amer

## 2019-06-24 ENCOUNTER — Inpatient Hospital Stay (HOSPITAL_COMMUNITY): Payer: 59

## 2019-06-24 ENCOUNTER — Encounter (HOSPITAL_COMMUNITY): Payer: Self-pay | Admitting: Cardiothoracic Surgery

## 2019-06-24 DIAGNOSIS — J90 Pleural effusion, not elsewhere classified: Secondary | ICD-10-CM

## 2019-06-24 HISTORY — DX: Pleural effusion, not elsewhere classified: J90

## 2019-06-24 LAB — SEDIMENTATION RATE: Sed Rate: 70 mm/hr — ABNORMAL HIGH (ref 0–22)

## 2019-06-24 MED ORDER — METOPROLOL TARTRATE 12.5 MG HALF TABLET
12.5000 mg | ORAL_TABLET | Freq: Once | ORAL | Status: AC
  Administered 2019-06-24: 10:00:00 12.5 mg via ORAL
  Filled 2019-06-24: qty 1

## 2019-06-24 MED ORDER — METOPROLOL TARTRATE 25 MG PO TABS
25.0000 mg | ORAL_TABLET | Freq: Two times a day (BID) | ORAL | Status: DC
  Administered 2019-06-24 – 2019-07-02 (×16): 25 mg via ORAL
  Filled 2019-06-24 (×16): qty 1

## 2019-06-24 MED ORDER — AMLODIPINE BESYLATE 5 MG PO TABS
5.0000 mg | ORAL_TABLET | Freq: Every day | ORAL | Status: DC
  Administered 2019-06-24 – 2019-07-02 (×9): 5 mg via ORAL
  Filled 2019-06-24 (×9): qty 1

## 2019-06-24 MED ORDER — METOPROLOL TARTRATE 25 MG PO TABS: 25.0000 mg | ORAL_TABLET | Freq: Two times a day (BID) | ORAL | Status: DC

## 2019-06-24 NOTE — Plan of Care (Signed)

## 2019-06-24 NOTE — Progress Notes (Addendum)
[address]       [place] [address]             [phone]      1 Day Post-Op Procedure(s) (LRB): STERNAL WOUND DEBRIDEMENT (N/A) APPLICATION OF WOUND VAC (N/A) Subjective: Feels better today. Her chest tube site pain has improved.   Objective: Vital signs in last 24 hours: Temp:  [97.6 F (36.4 C)-98.3 F (36.8 C)] 97.8 F (36.6 C) (08/19 0758) Pulse Rate:  [82-99] 88 (08/19 0758) Cardiac Rhythm: Normal sinus rhythm (08/19 0758) Resp:  [12-22] 15 (08/19 0758) BP: (104-145)/(59-101) 112/65 (08/19 0758) SpO2:  [91 %-100 %] 100 % (08/19 0758) Weight:  [91 kg] 91 kg (08/19 0500)     Intake/Output from previous day: 08/18 0701 - 08/19 0700 In: 800 [I.V.:700] Out: 1800 [Urine:1600; Chest Tube:200] Intake/Output this shift: No intake/output data recorded.  General appearance: alert, cooperative and no distress Heart: regular rate and rhythm, S1, S2 normal, no murmur, click, rub or gallop Lungs: clear to auscultation bilaterally Abdomen: soft, non-tender; bowel sounds normal; no masses,  no organomegaly Extremities: 1+ nonpitting edema in lower and upper extremity Wound: wound vac with good suction.  Lab Results: Recent Labs    06/22/19 0351 06/23/19 0228  WBC 11.3* 11.0*  HGB 8.1* 7.7*  HCT 26.5* 25.1*  PLT 534* 525*   BMET:  Recent Labs    06/22/19 0240 06/23/19 0228  NA 137 138  K 4.6 4.1  CL 108 107  CO2 18* 24  GLUCOSE 91 105*  BUN 12 19  CREATININE 0.98 1.25*  CALCIUM 8.4* 8.4*    PT/INR: No results for input(s): LABPROT, INR in the last 72 hours. ABG    Component Value Date/Time   PHART 7.312 (L) 06/10/2019 1947   HCO3 23.1 06/10/2019 1947   TCO2 22 06/10/2019 2014   ACIDBASEDEF 3.0 (H) 06/10/2019 1947   O2SAT 97.0 06/10/2019 1947   CBG (last 3)  No results for input(s): GLUCAP in the last 72 hours.  Assessment/Plan: S/P Procedure(s) (LRB): STERNAL WOUND DEBRIDEMENT (N/A) APPLICATION OF WOUND VAC (N/A)    S/P CABG and discharged about 1 week ago. Readmitted for a large left effusion. Chest tube placement on 8/17. Brought back to the OR on 8/18 for a sternal debridement and wound vac placement.   1. Chest tube-200cc recorded in 24 hours. No airleak. 1200cc total in atrium since placement. CXR showed: stable left apical pneumothorax and slight increase in right-sided pleural effusion.   2. Hemodynamically stable and in NSR. BP well controlled.  3. Tolerating room air with good oxygenation 4. Wound vac in place with good suction. Not much drainage.  5. H and H- 7.7/25.1, expected acute blood loss anemia. Will trend closely. 6. Creatinine is 1.25.  Continue daily Lasix for fluid overload. Not much urine overnight however, patient states she has not drank much water since surgery. Working on fluid intake this morning and will re-evaluate. Will order bedside commode.  7. Abx-Tolerating Vancomycin and Maxipime. WBC 11.0. No fevers.     Plan: No labs ordered today. Will order BMP and CBC for tomorrow. Continue use of incentive spirometer. Continue daily lasix for fluid overload. Work on fluid intake. OOB to chair. Chest tube site pain is better today.    LOS: 3 days    Kimberly Bryant 06/24/2019   Pt seen and examined with the assistance of language interpreter. All questions answered to patient's apparent satisfaction, although in regards  to any decision making as to disposition she defers to her daughter. In my opinion, the patient is best served by SNF or in-patient rehab assignment because she is borderline failure to thrive after surgery. I'm concerned that home health will not be able to adequately manage her somewhat complex recovery made worse by unique and difficult language barriers.  Brantley FlingB. Zane Liara Holm, MD

## 2019-06-24 NOTE — Progress Notes (Signed)
Inpatient Rehab Admissions:  Inpatient Rehab Consult received.  Note pt has no orders for PT/OT evaluations.  Will need therapy recommendations to proceed with CIR assessment.    Shann Medal, PT, DPT Admissions Coordinator [phone] 06/24/19  10:50 AM

## 2019-06-25 ENCOUNTER — Ambulatory Visit: Payer: 59 | Admitting: Physician Assistant

## 2019-06-25 LAB — CBC
HCT: 26.5 % — ABNORMAL LOW (ref 36.0–46.0)
Hemoglobin: 8.2 g/dL — ABNORMAL LOW (ref 12.0–15.0)
MCH: 27.1 pg (ref 26.0–34.0)
MCHC: 30.9 g/dL (ref 30.0–36.0)
MCV: 87.5 fL (ref 80.0–100.0)
Platelets: 618 10*3/uL — ABNORMAL HIGH (ref 150–400)
RBC: 3.03 MIL/uL — ABNORMAL LOW (ref 3.87–5.11)
RDW: 13.5 % (ref 11.5–15.5)
WBC: 14.8 10*3/uL — ABNORMAL HIGH (ref 4.0–10.5)
nRBC: 0 % (ref 0.0–0.2)

## 2019-06-25 LAB — BASIC METABOLIC PANEL
Anion gap: 9 (ref 5–15)
BUN: 27 mg/dL — ABNORMAL HIGH (ref 8–23)
CO2: 22 mmol/L (ref 22–32)
Calcium: 8.5 mg/dL — ABNORMAL LOW (ref 8.9–10.3)
Chloride: 106 mmol/L (ref 98–111)
Creatinine, Ser: 1.15 mg/dL — ABNORMAL HIGH (ref 0.44–1.00)
GFR calc Af Amer: 59 mL/min — ABNORMAL LOW (ref >60)
GFR calc non Af Amer: 51 mL/min — ABNORMAL LOW (ref >60)
Glucose, Bld: 131 mg/dL — ABNORMAL HIGH (ref 70–99)
Potassium: 4.2 mmol/L (ref 3.5–5.1)
Sodium: 137 mmol/L (ref 135–145)

## 2019-06-25 NOTE — Progress Notes (Deleted)
Cardiology Office Note:    Date:  06/25/2019   ID:  Kimberly Bryant, DOB Mar 22, 1956, MRN [id number]  PCP:  Antony Blackbird, MD  Cardiologist:  Fransico Him, MD *** Electrophysiologist:  None   Referring MD: Antony Blackbird, MD   No chief complaint on file. ***  History of Present Illness:    Kimberly Bryant is a 63 y.o. 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  Cardiac catheterization demonstrated severe 3 v CAD and she underwent CABG with Dr. Orvan Seen (L-LAD, S-OM, S-Dx, S-apical LAD).  Post op course was fairly uneventful.  However, she was readmitted 8/16-8/20 with a large L pleural effusion and drainage from her sternal wound.  She had a chest tube placement and underwent sternal debridement with wound VAC placement.    ***  Prior CV studies:   The following studies were reviewed today:  Pre-CABG Dopplers 06/09/2019 Bilat ICA 1-39 LE artery waveforms WNL  Cardiac catheterization 06/09/2019 Severe three-vessel coronary obstructive disease with 60 and 95% proximal LAD stenoses, 90% mid and diffuse 90% distal LAD stenoses; total recent occlusion of the second circumflex marginal vessel of the circumflex with contrast hang-up and 90% stenosis in the AV groove circumflex just after this marginal takeoff; and total mid RCA occlusion with possible small bridging collaterals with total distal RCA occlusion with collateralization distally via the proximal marginal branch and left circulation.  LVEDP 16 mm.  Left ventriculography was not done, but the echo report which came back during the procedure showed EF greater than 65%.  RECOMMENDATION: Surgical consultation for CABG revascularization surgery.   Echocardiogram 06/09/2019 EF > 65, severe LVH, Gr 2 DD, no RWMA, normal RVSF, RVSP 32.1, mod LAE, mild  RAE, mod MR  Past Medical History:  Diagnosis Date  . CAD (coronary artery disease)   . Hyperlipidemia   . Hypertension   . Pleural effusion 06/24/2019   LEFT   Surgical Hx: The patient  has a past surgical history that includes LEFT HEART CATH AND CORONARY ANGIOGRAPHY (N/A, 06/09/2019); Coronary artery bypass graft (N/A, 06/10/2019); TEE without cardioversion (N/A, 06/10/2019); Sternal wound debridement (N/A, 05/14/6268); and Application if wound vac (N/A, 06/23/2019).   Current Medications: No outpatient medications have been marked as taking for the 06/26/19 encounter (Appointment) with Richardson Dopp T, PA-C.     Allergies:   Carafate [sucralfate] and Lisinopril   Social History   Tobacco Use  . Smoking status: Never Smoker  . Smokeless tobacco: Never Used  Substance Use Topics  . Alcohol use: Never    Frequency: Never  . Drug use: Never     Family Hx: The patient's family history includes Hypertension in her mother.  ROS:   Please see the history of present illness.    ROS All other systems reviewed and are negative.   EKGs/Labs/Other Test Reviewed:    EKG:  EKG is *** ordered today.  The ekg ordered today demonstrates ***  Recent Labs: 06/08/2019: ALT 22 06/23/2019: Magnesium 2.1 06/25/2019: BUN 27; Creatinine, Ser 1.15; Hemoglobin 8.2; Platelets 618; Potassium 4.2; Sodium 137   Recent Lipid Panel Lab Results  Component Value Date/Time   CHOL 177 12/10/2018 11:17 AM   TRIG 68 12/10/2018 11:17 AM   HDL 54 12/10/2018 11:17 AM   CHOLHDL 3.3 12/10/2018 11:17 AM  LDLCALC 109 (H) 12/10/2018 11:17 AM    Physical Exam:    VS:  There were no vitals taken for this visit.    Wt Readings from Last 3 Encounters:  06/25/19 201 lb 15.1 oz (91.6 kg)  06/16/19 202 lb 9.6 oz (91.9 kg)  06/07/19 197 lb (89.4 kg)     ***Physical Exam  ASSESSMENT & PLAN:    No diagnosis found.***  Dispo:  No follow-ups on file.   Medication Adjustments/Labs and Tests Ordered: Current  medicines are reviewed at length with the patient today.  Concerns regarding medicines are outlined above.  Tests Ordered: No orders of the defined types were placed in this encounter.  Medication Changes: No orders of the defined types were placed in this encounter.   Signed, Tereso NewcomerScott Zinia Innocent, PA-C  06/25/2019 2:00 PM    [facility] [address], [place], [place]  [address] Phone: [phone]; Fax: [phone]

## 2019-06-25 NOTE — Progress Notes (Signed)
      [address]       [place] [address]             [phone]      2 Days Post-Op Procedure(s) (LRB): STERNAL WOUND DEBRIDEMENT (N/A) APPLICATION OF WOUND VAC (N/A) Subjective: Feels okay this morning. States that she does not have any pain. She did get to the chair yesterday but has not ambulated much. PT/OT to evaluate today.   Objective: Vital signs in last 24 hours: Temp:  [97.5 F (36.4 C)-98.2 F (36.8 C)] 98 F (36.7 C) (08/20 0415) Pulse Rate:  [81-94] 81 (08/20 0415) Cardiac Rhythm: Normal sinus rhythm (08/19 1945) Resp:  [13-21] 13 (08/20 0415) BP: (103-126)/(65-87) 103/70 (08/20 0415) SpO2:  [94 %-98 %] 98 % (08/20 0415) Weight:  [91.6 kg] 91.6 kg (08/20 0415)     Intake/Output from previous day: 08/19 0701 - 08/20 0700 In: 733 [P.O.:730; I.V.:3] Out: 1230 [Urine:1220; Chest Tube:10] Intake/Output this shift: No intake/output data recorded.  General appearance: alert, cooperative and no distress Heart: regular rate and rhythm, S1, S2 normal, no murmur, click, rub or gallop Lungs: clear to auscultation bilaterally Abdomen: soft, non-tender; bowel sounds normal; no masses,  no organomegaly Extremities: extremities normal, atraumatic, no cyanosis or edema Wound: wound vac with good suction.   Lab Results: Recent Labs    06/23/19 0228 06/25/19 0212  WBC 11.0* 14.8*  HGB 7.7* 8.2*  HCT 25.1* 26.5*  PLT 525* 618*   BMET:  Recent Labs    06/23/19 0228 06/25/19 0212  NA 138 137  K 4.1 4.2  CL 107 106  CO2 24 22  GLUCOSE 105* 131*  BUN 19 27*  CREATININE 1.25* 1.15*  CALCIUM 8.4* 8.5*    PT/INR: No results for input(s): LABPROT, INR in the last 72 hours. ABG    Component Value Date/Time   PHART 7.312 (L) 06/10/2019 1947   HCO3 23.1 06/10/2019 1947   TCO2 22 06/10/2019 2014   ACIDBASEDEF 3.0 (H) 06/10/2019 1947   O2SAT 97.0 06/10/2019 1947   CBG (last 3)  No results for input(s): GLUCAP in the last 72 hours.   Assessment/Plan: S/P Procedure(s) (LRB): STERNAL WOUND DEBRIDEMENT (N/A) APPLICATION OF WOUND VAC (N/A)  1. Chest tube-10cc recorded in 24 hours.No airleak. No CXR this morning. Will order one for tomorrow.  2. Hemodynamically stable and in NSR. BP well controlled.  3. Tolerating room air with good oxygenation 4. Wound vac in place with good suction. Not much drainage.  5. H and H- 8.2/26.5, expected acute blood loss anemia. Will trend closely. 6. Creatinine is 1.15, improved.  Continue daily Lasix for fluid overload. Urine output appears adequate.  7. Abx-Tolerating Vancomycin and Maxipime. WBC 14.8. No fevers  Plan: Continue chest tube and wound vac. PT/OT to evaluate today. SNF vs. IP rehab discussed with the patient this morning. She does not have a strong opinion about her disposition. Family to visit this morning and perhaps we can discuss further.    LOS: 4 days    Elgie Collard 06/25/2019

## 2019-06-25 NOTE — Evaluation (Signed)
Physical Therapy Evaluation Patient Details Name: Kimberly Bryant MRN: [id number] DOB: 10/25/1956 Today's Date: 06/25/2019   History of Present Illness  Pt is a 63 y/o female presenting to ED on 8/16 with large L pleura effusion with CT placement and dehised chest incision with debridement and wound vac placed on 8/18. 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   Clinical Impression  Pt admitted with above diagnosis. Pt able to ambulate in hallway with RW with good stability.  Pt needs min cues for sternal precautions as she forgets to not use UES.  Really needs someone with her at home.  Will follow acutely.   Pt currently with functional limitations due to the deficits listed below (see PT Problem List). Pt will benefit from skilled PT to increase their independence and safety with mobility to allow discharge to the venue listed below.      Follow Up Recommendations Home health PT;Supervision/Assistance - 24 hour    Equipment Recommendations  3in1 (PT)(if she doesn't have one)    Recommendations for Other Services       Precautions / Restrictions Precautions Precautions: Fall;Sternal Precaution Booklet Issued: Yes (comment) Precaution Comments: reviewed precautions with pt, L chest tube, wound vac Restrictions Weight Bearing Restrictions: Yes(Sternal precautions) Other Position/Activity Restrictions: sternal precautions       Mobility  Bed Mobility Overal bed mobility: Needs Assistance Bed Mobility: Rolling;Sidelying to Sit Rolling: Mod assist Sidelying to sit: Mod assist       General bed mobility comments: cues for technique and avoid use of UEs, assist to raise trunk  Transfers Overall transfer level: Needs assistance Equipment used: Rolling walker (2 wheeled) Transfers: Sit to/from Stand Sit to Stand: Min assist         General transfer comment: cues to avoid UE use with sit to stand, steadying assist  Ambulation/Gait Ambulation/Gait assistance: Min guard;Min  assist Gait Distance (Feet): 150 Feet Assistive device: Rolling walker (2 wheeled) Gait Pattern/deviations: Step-through pattern;Decreased stride length;Drifts right/left   Gait velocity interpretation: <1.31 ft/sec, indicative of household ambulator General Gait Details: Pt was able to ambulate well and safely with RW.    Stairs            Wheelchair Mobility    Modified Rankin (Stroke Patients Only)       Balance Overall balance assessment: Needs assistance Sitting-balance support: No upper extremity supported;Feet supported Sitting balance-Leahy Scale: Fair     Standing balance support: Bilateral upper extremity supported;During functional activity Standing balance-Leahy Scale: Poor Standing balance comment: reliant on BUE support dynamically                             Pertinent Vitals/Pain Pain Assessment: Faces Faces Pain Scale: No hurt Pain Location: incisional, chest Pain Descriptors / Indicators: Discomfort;Operative site guarding Pain Intervention(s): Limited activity within patient's tolerance;Monitored during session;Repositioned    Home Living Family/patient expects to be discharged to:: Private residence Living Arrangements: Children Available Help at Discharge: Family;Available PRN/intermittently(family works, pt is alone in the afternoon) Type of Home: House Home Access: Stairs to enter   Entergy CorporationEntrance Stairs-Number of Steps: 1 Home Layout: One level Home Equipment: Environmental consultantWalker - 2 wheels Additional Comments: pt is a poor historian, varying responses to home set up, used interpreter [phone] [place]    Prior Function Level of Independence: Needs assistance   Gait / Transfers Assistance Needed: was using a RW  ADL's / Homemaking Assistance Needed: daughter assisted with LB ADL  Comments: uses walker for mobility, independent ADLS     Hand Dominance   Dominant Hand: Right    Extremity/Trunk Assessment   Upper Extremity  Assessment Upper Extremity Assessment: Defer to OT evaluation    Lower Extremity Assessment Lower Extremity Assessment: Generalized weakness    Cervical / Trunk Assessment Cervical / Trunk Assessment: Normal  Communication   Communication: Prefers language other than [place];Other (comment)(haitian [place])  Cognition Arousal/Alertness: Awake/alert Behavior During Therapy: Flat affect Overall Cognitive Status: Difficult to assess Area of Impairment: Attention;Following commands;Problem solving                   Current Attention Level: Sustained   Following Commands: Follows one step commands with increased time     Problem Solving: Slow processing;Decreased initiation;Difficulty sequencing;Requires verbal cues;Requires tactile cues General Comments: poor generalization of sternal precautions      General Comments General comments (skin integrity, edema, etc.): VSS on RA    Exercises     Assessment/Plan    PT Assessment Patient needs continued PT services  PT Problem List Decreased activity tolerance;Decreased balance;Decreased mobility;Decreased knowledge of use of DME;Decreased safety awareness;Decreased knowledge of precautions;Cardiopulmonary status limiting activity       PT Treatment Interventions DME instruction;Gait training;Functional mobility training;Therapeutic activities;Therapeutic exercise;Balance training;Stair training;Patient/family education    PT Goals (Current goals can be found in the Care Plan section)  Acute Rehab PT Goals Patient Stated Goal: pt deferring to her daughter for d/c disposition PT Goal Formulation: With patient Time For Goal Achievement: 07/09/19 Potential to Achieve Goals: Good    Frequency Min 3X/week   Barriers to discharge Decreased caregiver support      Co-evaluation PT/OT/SLP Co-Evaluation/Treatment: Yes Reason for Co-Treatment: For patient/therapist safety PT goals addressed during session: Mobility/safety  with mobility OT goals addressed during session: ADL's and self-care       AM-PAC PT "6 Clicks" Mobility  Outcome Measure Help needed turning from your back to your side while in a flat bed without using bedrails?: A Lot Help needed moving from lying on your back to sitting on the side of a flat bed without using bedrails?: A Lot Help needed moving to and from a bed to a chair (including a wheelchair)?: A Little Help needed standing up from a chair using your arms (e.g., wheelchair or bedside chair)?: A Little Help needed to walk in hospital room?: A Little Help needed climbing 3-5 steps with a railing? : A Little 6 Click Score: 16    End of Session Equipment Utilized During Treatment: Gait belt Activity Tolerance: Patient limited by fatigue Patient left: in chair;with call bell/phone within reach Nurse Communication: Mobility status PT Visit Diagnosis: Muscle weakness (generalized) (M62.81);Other abnormalities of gait and mobility (R26.89)    Time: 3716-9678 PT Time Calculation (min) (ACUTE ONLY): 26 min   Charges:   PT Evaluation $PT Eval Moderate Complexity: [place] Pager:  [phone]  Office:  [place] 06/25/2019, 11:13 AM

## 2019-06-25 NOTE — Progress Notes (Signed)
Inpatient Rehab Admissions Coordinator:   Note therapy recommendations are for home with 24/7 supervision and home health.  Will sign off at this time.   Shann Medal, PT, DPT Admissions Coordinator [phone] 06/25/19  11:34 AM

## 2019-06-25 NOTE — Evaluation (Signed)
Occupational Therapy Evaluation Patient Details Name: Kimberly Bryant Pillay MRN: [id number] DOB: 01/23/1956 Today's Date: 06/25/2019    History of Present Illness Pt is a 63 y/o female presenting to ED on 8/16 with large L pleura effusion with CT placement and dehised chest incision with debridement and wound vac placed on 8/18. 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    Clinical Impression   Pt was ambulating with a RW and assisted for LB ADL at home prior to readmission. She presents with impaired cognition with difficulty recalling sternal precautions. She needs min to mod assist for mobility and set up to max assist for ADL. Pt states she is alone in the afternoon. Recommending home with 24 hour care.    Follow Up Recommendations  Home health OT;Supervision/Assistance - 24 hour    Equipment Recommendations  3 in 1 bedside commode(pt may already have)    Recommendations for Other Services       Precautions / Restrictions Precautions Precautions: Fall;Sternal Precaution Comments: reviewed precautions with pt, L chest tube, wound vac      Mobility Bed Mobility Overal bed mobility: Needs Assistance Bed Mobility: Rolling;Sidelying to Sit Rolling: Mod assist Sidelying to sit: Mod assist       General bed mobility comments: cues for technique and avoid use of UEs, assist to raise trunk  Transfers Overall transfer level: Needs assistance Equipment used: Rolling walker (2 wheeled) Transfers: Sit to/from Stand Sit to Stand: Min assist         General transfer comment: cues to avoid UE use with sit to stand, steadying assist    Balance Overall balance assessment: Needs assistance   Sitting balance-Leahy Scale: Fair       Standing balance-Leahy Scale: Poor Standing balance comment: reliant on BUE support dynamically                           ADL either performed or assessed with clinical judgement   ADL Overall ADL's : Needs  assistance/impaired Eating/Feeding: Independent;Sitting   Grooming: Min guard;Standing   Upper Body Bathing: Minimal assistance;Sitting   Lower Body Bathing: Maximal assistance;Sit to/from stand   Upper Body Dressing : Minimal assistance;Sitting   Lower Body Dressing: Maximal assistance;Sit to/from stand   Toilet Transfer: Minimal assistance;Ambulation;RW   Toileting- Clothing Manipulation and Hygiene: Minimal assistance;Sit to/from stand Toileting - Clothing Manipulation Details (indicate cue type and reason): min guard for balance, assist for gown, pt performed pericare      Functional mobility during ADLs: Minimal assistance;Rolling walker       Vision Patient Visual Report: No change from baseline       Perception     Praxis      Pertinent Vitals/Pain Pain Assessment: Faces Faces Pain Scale: No hurt     Hand Dominance Right   Extremity/Trunk Assessment Upper Extremity Assessment Upper Extremity Assessment: Generalized weakness(not formally assessed due to sternal precautions)   Lower Extremity Assessment Lower Extremity Assessment: Defer to PT evaluation   Cervical / Trunk Assessment Cervical / Trunk Assessment: Normal   Communication Communication Communication: Prefers language other than [place];Other (comment)(haitian)   Cognition Arousal/Alertness: Awake/alert Behavior During Therapy: Flat affect Overall Cognitive Status: Difficult to assess Area of Impairment: Attention;Following commands;Problem solving                   Current Attention Level: Sustained   Following Commands: Follows one step commands with increased time     Problem Solving:  Slow processing;Decreased initiation;Difficulty sequencing;Requires verbal cues;Requires tactile cues General Comments: poor generalization of sternal precautions   General Comments       Exercises     Shoulder Instructions      Home Living Family/patient expects to be discharged to::  Private residence Living Arrangements: Children Available Help at Discharge: Family;Available PRN/intermittently(family works, pt is alone in the afternoon) Type of Home: House Home Access: Stairs to enter [employer] of Steps: 1   Home Layout: One level     Bathroom Shower/Tub: Teacher, early years/pre: Standard     Home Equipment: Environmental consultant - 2 wheels   Additional Comments: pt is a poor historian, varying responses to home set up, used interpreter [phone] [place]      Prior Functioning/Environment Level of Independence: Needs assistance  Gait / Transfers Assistance Needed: was using a RW ADL's / Homemaking Assistance Needed: daughter assisted with LB ADL            OT Problem List: Decreased strength;Decreased activity tolerance;Impaired balance (sitting and/or standing);Pain;Cardiopulmonary status limiting activity;Decreased knowledge of precautions;Decreased knowledge of use of DME or AE;Decreased safety awareness;Obesity;Increased edema      OT Treatment/Interventions: Self-care/ADL training;Energy conservation;DME and/or AE instruction;Therapeutic activities;Balance training;Patient/family education    OT Goals(Current goals can be found in the care plan section) Acute Rehab OT Goals Patient Stated Goal: pt deferring to her daughter for d/Bryant disposition OT Goal Formulation: With patient Time For Goal Achievement: 07/09/19 Potential to Achieve Goals: Good  OT Frequency: Min 2X/week   Barriers to D/Bryant: Decreased caregiver support          Co-evaluation PT/OT/SLP Co-Evaluation/Treatment: Yes     OT goals addressed during session: ADL's and self-care      AM-PAC OT "6 Clicks" Daily Activity     Outcome Measure Help from another person eating meals?: None Help from another person taking care of personal grooming?: A Little Help from another person toileting, which includes using toliet, bedpan, or urinal?: A Little Help from another person  bathing (including washing, rinsing, drying)?: A Lot Help from another person to put on and taking off regular upper body clothing?: A Little Help from another person to put on and taking off regular lower body clothing?: A Lot 6 Click Score: 17   End of Session Equipment Utilized During Treatment: Gait belt;Rolling walker Nurse Communication: Mobility status;Other (comment)(ok to take off suction to ambulate)  Activity Tolerance: Patient tolerated treatment well Patient left: in chair;with call bell/phone within reach  OT Visit Diagnosis: Other abnormalities of gait and mobility (R26.89);Muscle weakness (generalized) (M62.81);Other symptoms and signs involving cognitive function                Time: 6270-3500 OT Time Calculation (min): 29 min Charges:  OT General Charges $OT Visit: 1 Visit OT Evaluation $OT Eval Moderate Complexity: 1 Mod  Nestor Lewandowsky, OTR/L Acute Rehabilitation Services Pager: [phone] Office: [phone] [place] So 06/25/2019, 10:35 AM

## 2019-06-25 NOTE — TOC Initial Note (Addendum)
Transition of Care [facility](TOC) - Initial/Assessment Note    Patient Details  Name: Kimberly Bryant MRN: [id number] Date of Birth: 10/07/1956  Transition of Care [facility](TOC) CM/SW Contact:    Kimberly Bryant, Kimberly Wenk Clinton, RN Phone Number: 06/25/2019, 3:27 PM  Clinical Narrative:                 From home with daughter, [facility],  Per daughter Kimberly Bryant they want to continue with [facility].  NCM notified Kimberly Bryant of possible dc date tomorrow or weekend.  Patient will also need a [employer] wound Vac, prescription and demo, h/p and op not faxed to [employer] with [employer].   8/21 Kimberly Capeeborah Jandiel Magallanes RN, BSN - NCM spoke with Kimberly Bryant, she states they will have the [employer] wound vac sent by [employer] tomorrow. Kimberly Bryant [phone].    1615 Kimberly Capeeborah Kimberly Fifita RN, BSN - NCM received call from RN, stating patient's daughter is at bedside and has changed her mind and wants patient to go to SNF.  NCM asked if patient was willing to go  She states yes.  [facility] Also Kimberly Bryant with [employer] [phone]  said to call her when we find out what facility she will be going to so she can help with the wound vac.    Expected Discharge Plan: Home w Home Health Services Barriers to Discharge: No Barriers Identified   Patient Goals and CMS Choice Patient states their goals for this hospitalization and ongoing recovery are:: to get better CMS Medicare.gov Compare Post Acute Care list provided to:: Patient Represenative (must comment)(daughter, Kimberly Bryant) Choice offered to / list presented to : Adult Children  Expected Discharge Plan and Services Expected Discharge Plan: Home w Home Health Services In-house Referral: NA Discharge Planning Services: CM Consult Post Acute Care Choice: Home Health Living arrangements for the past 2 months: Single Family Home                 DME Arranged: Negative pressure wound device DME Agency: [employer] Date DME Agency Contacted: 06/25/19 Time DME Agency Contacted: 1526 Representative spoke  with at DME Agency: Kimberly Bryant HH Arranged: RN,PT,OT [facility]     [facility] Agency: Kimberly Bryant  Prior Living Arrangements/Services Living arrangements for the past 2 months: Single Family Home Lives with:: Adult Children Patient language and need for interpreter reviewed:: Yes Do you feel safe going back to the place where you live?: Yes      Need for Family Participation in Patient Care: Yes (Comment) Care giver support system in place?: Yes (comment)   Criminal Activity/Legal Involvement Pertinent to Current Situation/Hospitalization: No - Comment as needed  Activities of Daily Living Home Assistive Devices/Equipment: Dan HumphreysWalker (specify type) ADL Screening (condition at time of admission) Patient's cognitive ability adequate to safely complete daily activities?: Yes Is the patient deaf or have difficulty hearing?: No Does the patient have difficulty seeing, even when wearing glasses/contacts?: No Does the patient have difficulty concentrating, remembering, or making decisions?: No Patient able to express need for assistance with ADLs?: Yes Does the patient have difficulty dressing or bathing?: No Independently performs ADLs?: Yes (appropriate for developmental age) Does the patient have difficulty walking or climbing stairs?: Yes Weakness of Legs: Both Weakness of Arms/Hands: None  Permission Sought/Granted                  Emotional Assessment Appearance:: Appears  stated age Attitude/Demeanor/Rapport: Gracious Affect (typically observed): Appropriate Orientation: : Oriented to Self, Oriented to  Time, Oriented to Place, Oriented to [employer]) Alcohol / Substance Use: Not Applicable Psych Involvement: No (comment)  Admission diagnosis:  Pleural effusion [J90] S/P CABG (coronary artery bypass graft) [Z95.1] Wound dehiscence [T81.30XA] Patient Active Problem List   Diagnosis Date Noted  .  Pleural effusion on left 06/21/2019  . 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 Atypical chest pain   . Elevated troponin   . Hypertensive urgency   . NSTEMI (non-ST elevated myocardial infarction) ([place])   . ACS (acute coronary syndrome) ([place]) 06/08/2019  . CAD (coronary artery disease) 09/15/2018  . HTN (hypertension) 09/15/2018  . GERD (gastroesophageal reflux disease) 09/15/2018  . Chest pain, atypical 09/15/2018  . Hyperlipidemia 09/15/2018   PCP:  Antony Blackbird, MD Pharmacy:   [facility] [address] [place] [address] Phone: [phone] Fax: [phone]     Social Determinants of Health (SDOH) Interventions    Readmission Risk Interventions Readmission Risk Prevention Plan 06/16/2019  Transportation Screening Complete  PCP or Specialist Appt within 5-7 Days Complete  Home Care Screening Complete  Medication Review (RN CM) Complete

## 2019-06-25 NOTE — Discharge Summary (Signed)
[address]       [place] [id number]             [phone]      Physician Discharge Summary  Patient ID: Kimberly Bryant MRN: [id number] DOB/AGE: 62/01/1956 63 y.o.  Admit date: 06/21/2019 Discharge date: 07/02/2019  Admission Diagnoses: Patient Active Problem List   Diagnosis Date Noted  . Pleural effusion on left 06/21/2019  . Atypical chest pain   . Elevated troponin   . Hypertensive urgency   . NSTEMI (non-ST elevated myocardial infarction) (H[facility])   . ACS (acute coronary syndrome) (H[facility]) 06/08/2019  . CAD (coronary artery disease) 09/15/2018  . HTN (hypertension) 09/15/2018  . GERD (gastroesophageal reflux disease) 09/15/2018  . Chest pain, atypical 09/15/2018  . Hyperlipidemia 09/15/2018    Discharge Diagnoses:  Active Problems:   Pleural effusion on left   Discharged Condition: good  HPI:   Ms. Elon AlasMarie Farrier is a 63 year old Cambod[place]aitian lady presented with AMI in late July '20. 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 Underwent CABG and did well--discharged after approximately one week. Late last week, began draining slightly from lower portion of sternal wound. No fevers/chills; drainage was/is serous, not purulent or bloody. Drainage amount worsened and had increased difficulty breathing yesterday, so she presented to ED. CXR shows large left effusion and copious drainage from lower midline sternum. Hemodynamically stable; denies chest pain.   Hospital Course:   Ms. Elon AlasMarie Kem was admitted for a left large pleural effusion.  She is status post coronary bypass grafting x3 in July of this year.  A left-sided chest tube was placed for the left large pleural effusion.  She was then taken to the OR for a sternal debridement and wound VAC placement on 06/23/2019.  She tolerated procedure well and was [facility].  Wound VAC had good suction and the output was minimal.  We continued to monitor her chest tube drainage closely.  We consulted inpatient rehab  due to debility and also had physical therapy and occupational therapy evaluate her.  They both recommended 24-hour surveillance and home health physical therapy and occupational therapy. Her left chest tube was removed on 8/21. We will continue her chest tube suture until her follow-up visit. She will continue to get wound vac changes M,W,F at the facility until follow-up next week. She is stable to discharge today to a SNF.   Consults: None  Significant Diagnostic Studies:   CLINICAL DATA:  Follow-up pleural effusion  EXAM: PORTABLE CHEST 1 VIEW  COMPARISON:  06/23/2019  FINDINGS: Cardiac shadow is enlarged but stable. Postsurgical changes are seen. Right-sided pleural effusion is noted slightly more prominent although this may be positional in nature. Left chest tube is again seen with left basilar atelectasis identified. Tiny apical pneumothorax is again noted and stable. No bony abnormality is seen.  IMPRESSION: Stable left apical pneumothorax  Slight increase in right-sided pleural effusion.   Electronically Signed   By: Alcide CleverMark  Lukens M.D.   On: 06/24/2019 08:05   Treatments:   Procedure(s): STERNAL WOUND DEBRIDEMENT APPLICATION OF WOUND VAC Procedure Note  Kimberly SamMarie C Rodenbeck female 63 y.o. 06/23/2019  Procedure(s) and Anesthesia Type:    * STERNAL WOUND DEBRIDEMENT - General    * APPLICATION OF WOUND VAC - General  Surgeon(s) and Role:    * Linden DolinAtkins, Broadus Z, MD - Primary   Indications: The patient was admitted to the hospital with increased wound drainage s/p CABG. The patient  now presents for sternal wound examination and debridement after discussing therapeutic alternatives.        Surgeon: Linden DolinBroadus Z Atkins   Assistants: none  Anesthesia: General endotracheal anesthesia  ASA Class: 3   Discharge Exam: Blood pressure 135/68, pulse 78, temperature 98.3 F (36.8 C), temperature source Oral, resp. rate 14, height 5\' 3"  (1.6 m), weight 86  kg, SpO2 94 %.    General appearance: alert, cooperative and no distress Heart: regular rate and rhythm, S1, S2 normal, no murmur, click, rub or gallop Lungs: clear to auscultation bilaterally Abdomen: soft, non-tender; bowel sounds normal; no masses,  no organomegaly Extremities: extremities normal, atraumatic, no cyanosis or edema Wound: clean and dry. Wound vac with good suction    Disposition:  Discharge disposition: 03-Skilled Nursing Facility        Allergies as of 07/02/2019      Reactions   Carafate [sucralfate] Swelling, Other (See Comments)   Patient stated it made her face swell (angioedema)- no breathing issues, however   Lisinopril Swelling, Other (See Comments)   Patient stated it made her face swell (angioedema)- no breathing issues, however      Medication List    STOP taking these medications   aspirin 81 MG EC tablet Replaced by: aspirin 325 MG tablet   clopidogrel 75 MG tablet Commonly known as: PLAVIX   metoprolol succinate 25 MG 24 hr tablet Commonly known as: TOPROL-XL   traMADol 50 MG tablet Commonly known as: ULTRAM     TAKE these medications   acetaminophen 325 MG tablet Commonly known as: TYLENOL Take 2 tablets (650 mg total) by mouth every 6 (six) hours as needed for mild pain (or Fever >/= 101).   amLODipine 5 MG tablet Commonly known as: NORVASC Take 1 tablet (5 mg total) by mouth daily. Start taking on: July 03, 2019 What changed:   medication strength  how much to take   aspirin 325 MG tablet Take 1 tablet (325 mg total) by mouth daily. Start taking on: July 03, 2019 Replaces: aspirin 81 MG EC tablet   atorvastatin 80 MG tablet Commonly known as: LIPITOR Take 1 tablet (80 mg total) by mouth daily at 6 PM.   ciprofloxacin 500 MG tablet Commonly known as: CIPRO Take 1 tablet (500 mg total) by mouth 2 (two) times daily for 9 days.   colchicine 0.6 MG tablet Take 1 tablet (0.6 mg total) by mouth daily. Start  taking on: July 03, 2019   docusate sodium 100 MG capsule Commonly known as: COLACE Take 1 capsule (100 mg total) by mouth 2 (two) times daily as needed for mild constipation.   furosemide 40 MG tablet Commonly known as: LASIX Take 1 tablet (40 mg total) by mouth daily. Start taking on: July 03, 2019   ibuprofen 400 MG tablet Commonly known as: ADVIL Take 1 tablet (400 mg total) by mouth 2 (two) times daily.   lansoprazole 30 MG capsule Commonly known as: Prevacid Take 1 capsule (30 mg total) by mouth 2 (two) times daily. To reduce stomach acid   metoprolol tartrate 25 MG tablet Commonly known as: LOPRESSOR Take 1 tablet (25 mg total) by mouth 2 (two) times daily. What changed:   medication strength  how much to take   oxyCODONE-acetaminophen 5-325 MG tablet Commonly known as: PERCOCET/ROXICET Take 1 tablet by mouth every 6 (six) hours as needed for moderate pain.   potassium chloride SA 20 MEQ tablet Commonly known as: K-DUR Take 1 tablet (20  mEq total) by mouth daily. Start taking on: July 03, 2019            Durable Medical Equipment  (From admission, onward)         Start     Ordered   06/26/19 0934  For home use only DME Negative pressure wound device  Once    Comments: Please change M,W,F  Question Answer Comment  Frequency of dressing change 3 times per week   Length of need Other see comments   Dressing type Foam   Amount of suction 125 mm/Hg   Pressure application Continuous pressure   Supplies 10 canisters and 15 dressings per month for duration of therapy      06/26/19 0935          Contact information for follow-up providers    [facility] Follow up.   Why: wound vac to be delivered to patient room before discharged       Orvan Seen, Glenice Bow, MD Follow up.   Specialty: Cardiothoracic Surgery Why: Your appointment is on 07/10/2019 at 2:00pm. Please arrive at 1:30pm for a chest xray located at [facility] which is on the first floor of  our building Contact information: [place] [address] [phone]        Imogene Burn, PA-C Follow up.   Specialty: Cardiology Why: Your post-op appointment is with Ermalinda Barrios, PA-C on 07/21/2019 at 8:30 am [facility]). Please bring your hospital paperwork  Contact information: [place] [address] [phone]            Contact information for after-discharge care    Destination    [facility] .   Service: Skilled Nursing Contact information: [address] [place] [phone]                 The patient has been discharged on:   1.Beta Blocker:  Yes [ yes ]                              No   [   ]                              If No, reason:  2.Ace Inhibitor/ARB: Yes [   ]                                     No  [  no  ]                                     If No, reason: allergic 3.Statin:   Yes [ yes ]                  No  [   ]                  If No, reason:  4.Ecasa:  Yes  [ yes ]                  No   [   ]  If No, reason:    Signed: Sharlene Doryessa N Taylan Mayhan 07/02/2019, 11:40 AM

## 2019-06-25 NOTE — Progress Notes (Signed)
      [address]       [place] [address]             [phone]       PT/OT recommendations reviewed. Spoke with case management. Ordered HHPT, OT, and Nursing. Patient had [place] prior to admission. Time of discharge will depend on chest tube removal. She will need home nursing to provide wound vac dressing changes. Measurements in the OR dictated in Dr. Orvan Seen Op Note: 4cm deep and 2 cm long.   Nicholes Rough, PA-C

## 2019-06-26 ENCOUNTER — Ambulatory Visit: Payer: 59 | Admitting: Physician Assistant

## 2019-06-26 ENCOUNTER — Ambulatory Visit: Payer: 59 | Admitting: Cardiothoracic Surgery

## 2019-06-26 ENCOUNTER — Inpatient Hospital Stay (HOSPITAL_COMMUNITY): Payer: 59

## 2019-06-26 MED ORDER — ENSURE ENLIVE PO LIQD
237.0000 mL | Freq: Two times a day (BID) | ORAL | Status: DC
  Administered 2019-06-26 – 2019-07-01 (×10): 237 mL via ORAL

## 2019-06-26 MED ORDER — IBUPROFEN 200 MG PO TABS
400.0000 mg | ORAL_TABLET | Freq: Two times a day (BID) | ORAL | Status: DC
  Administered 2019-06-26 – 2019-07-02 (×13): 400 mg via ORAL
  Filled 2019-06-26 (×13): qty 2

## 2019-06-26 NOTE — Progress Notes (Signed)
      [address]       [place] [address]             [phone]      3 Days Post-Op Procedure(s) (LRB): STERNAL WOUND DEBRIDEMENT (N/A) APPLICATION OF WOUND VAC (N/A) Subjective: No pain this morning. Does get short of breath with walking and having some leg pain. Interrupter used during entire encounter.  Objective: Vital signs in last 24 hours: Temp:  [97.6 F (36.4 C)-98.2 F (36.8 C)] 97.9 F (36.6 C) (08/21 0722) Pulse Rate:  [67-89] 81 (08/21 0722) Cardiac Rhythm: Normal sinus rhythm (08/21 0700) Resp:  [13-19] 17 (08/21 0722) BP: (107-134)/(67-90) 127/79 (08/21 0722) SpO2:  [96 %-100 %] 97 % (08/21 0722) Weight:  [89 kg] 89 kg (08/21 0625)     Intake/Output from previous day: 08/20 0701 - 08/21 0700 In: -  Out: 625 [Urine:600; Drains:25] Intake/Output this shift: Total I/O In: -  Out: 400 [Urine:400]  General appearance: alert, cooperative and no distress Heart: regular rate and rhythm, S1, S2 normal, no murmur, click, rub or gallop Lungs: clear to auscultation bilaterally Abdomen: soft, non-tender; bowel sounds normal; no masses,  no organomegaly Extremities: extremities normal, atraumatic, no cyanosis or edema Wound: clean and dry incision. Wound vac with good suction.   Lab Results: Recent Labs    06/25/19 0212  WBC 14.8*  HGB 8.2*  HCT 26.5*  PLT 618*   BMET:  Recent Labs    06/25/19 0212  NA 137  K 4.2  CL 106  CO2 22  GLUCOSE 131*  BUN 27*  CREATININE 1.15*  CALCIUM 8.5*    PT/INR: No results for input(s): LABPROT, INR in the last 72 hours. ABG    Component Value Date/Time   PHART 7.312 (L) 06/10/2019 1947   HCO3 23.1 06/10/2019 1947   TCO2 22 06/10/2019 2014   ACIDBASEDEF 3.0 (H) 06/10/2019 1947   O2SAT 97.0 06/10/2019 1947   CBG (last 3)  No results for input(s): GLUCAP in the last 72 hours.  Assessment/Plan: S/P Procedure(s) (LRB): STERNAL WOUND DEBRIDEMENT (N/A) APPLICATION OF WOUND VAC (N/A)   1. Chest tube-No drainage from chest tubes recorded.No airleak. CXR shows small left, stable apical pneumothorax and small pleural effusions.  2. Hemodynamically stable and in NSR. BP well controlled.  3. Toleratingroom airwith good oxygenation 4.Wound vac in place with good suction.Not much drainage-about 25cc since placement.  5. H and H- 8.2/26.5, expected acute blood loss anemia.  6. Creatinine is 1.15, improved.Continue daily Lasix for fluid overload. Urine output appears adequate.  7. Abx-Discontinued. No indication. 8. Not eating much because the food "does have much taste". Will order ensure vanilla which is what the patient prefers.    Plan: Discontinue left chest tube today. Follow-up CXR in the morning. Arranging [place] PT, OT, RN. Working with case management. Possible discharge over the weekend.     LOS: 5 days    Elgie Collard 06/26/2019

## 2019-06-26 NOTE — [place] FL2 (Signed)
[place] MEDICAID FL2 LEVEL OF CARE SCREENING TOOL     IDENTIFICATION  Patient Name: Kimberly Bryant Birthdate: Mar 06, 1956 Sex: female Admission Date (Current Location): 06/21/2019  [facility] and [place] Number:  Herbalist and Address:  [facility] [place] [place] [address], [place], [place] [address]      Provider Number: [id number]  Attending Physician Name and Address:  Wonda Olds, MD  Relative Name and Phone Number:       Current Level of Care: Hospital Recommended Level of Care: [place] Prior Approval Number:    Date Approved/Denied:   PASRR Number: [id number]  Discharge Plan: SNF    Current Diagnoses: Patient Active Problem List   Diagnosis Date Noted  . Pleural effusion on left 06/21/2019  . 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 Atypical chest pain   . Elevated troponin   . Hypertensive urgency   . NSTEMI (non-ST elevated myocardial infarction) ([facility])   . ACS (acute coronary syndrome) ([place]) 06/08/2019  . CAD (coronary artery disease) 09/15/2018  . HTN (hypertension) 09/15/2018  . GERD (gastroesophageal reflux disease) 09/15/2018  . Chest pain, atypical 09/15/2018  . Hyperlipidemia 09/15/2018    Orientation RESPIRATION BLADDER Height & Weight     Self, Time, Place, Situation  Normal Continent Weight: 196 lb 3.4 oz (89 kg) Height:  5\' 3"  (160 cm)(Pt doesn't know estimated)  BEHAVIORAL SYMPTOMS/MOOD NEUROLOGICAL BOWEL NUTRITION STATUS      Continent Diet(heart healthy)  AMBULATORY STATUS COMMUNICATION OF NEEDS Skin   Limited Assist Verbally Surgical wounds, Wound Vac(Closed incision on chest, liquid skin adhesive. Closed incision on chest, guaze dressing. CLosed incision right leg.[employer] wound vac to chest.)                       Personal Care Assistance Level of Assistance  Bathing, Feeding, Dressing Bathing Assistance: Limited assistance Feeding assistance: Independent Dressing Assistance: Limited assistance      Functional Limitations Info  Sight, Hearing, Speech Sight Info: Adequate Hearing Info: Adequate Speech Info: Adequate    SPECIAL CARE FACTORS FREQUENCY  PT (By licensed PT), OT (By licensed OT)     PT Frequency: 5x OT Frequency: 5x            Contractures Contractures Info: Not present    Additional Factors Info  Code Status, Allergies Code Status Info: Full Code Allergies Info: Carafate (Sucralfate), Lisinopril           Current Medications (06/26/2019):  This is the current hospital active medication list Current Facility-Administered Medications  Medication Dose Route Frequency Provider Last Rate Last Dose  . acetaminophen (TYLENOL) tablet 650 mg  650 mg Oral Q6H PRN Antony Odea, PA-C   650 mg at 06/24/19 1635   Or  . acetaminophen (TYLENOL) suppository 650 mg  650 mg Rectal Q6H PRN Roddenberry, Myron G, PA-C      . amLODipine (NORVASC) tablet 5 mg  5 mg Oral Daily Wonda Olds, MD   5 mg at 06/26/19 1012  . aspirin tablet 325 mg  325 mg Oral Daily Antony Odea, PA-C   325 mg at 06/26/19 1011  . atorvastatin (LIPITOR) tablet 80 mg  80 mg Oral q1800 Antony Odea, PA-C   80 mg at 06/24/19 1811  . colchicine tablet 0.6 mg  0.6 mg Oral Daily Roddenberry, Myron G, PA-C   0.6 mg at 06/26/19 1011  . docusate sodium (COLACE) capsule 100 mg  100 mg Oral BID PRN Roddenberry, Myron G, PA-C      . feeding supplement (ENSURE ENLIVE) (ENSURE ENLIVE) liquid 237 mL  237 mL Oral BID BM Conte, Tessa N, PA-C   237 mL at 06/26/19 1500  . furosemide (LASIX) tablet 40 mg  40 mg Oral Daily Roddenberry, Myron G, PA-C   40 mg at 06/26/19 1015  . ibuprofen (ADVIL) tablet 400 mg  400 mg Oral BID Linden DolinAtkins, Broadus Z, MD   400 mg at 06/26/19 1011  . metoprolol tartrate (LOPRESSOR) tablet 25 mg  25 mg Oral BID Linden DolinAtkins, Broadus Z, MD   25 mg at 06/26/19 1012  . morphine 2 MG/ML injection 2 mg  2 mg Intravenous Q2H PRN Leary RocaRoddenberry, Myron G, PA-C   2 mg at 06/26/19 1011  .  ondansetron (ZOFRAN) tablet 4 mg  4 mg Oral Q6H PRN Roddenberry, Myron G, PA-C       Or  . ondansetron (ZOFRAN) injection 4 mg  4 mg Intravenous Q6H PRN Roddenberry, Myron G, PA-C      . oxyCODONE-acetaminophen (PERCOCET/ROXICET) 5-325 MG per tablet 1-2 tablet  1-2 tablet Oral Q4H PRN Leary Rocaoddenberry, Myron G, PA-C   1 tablet at 06/26/19 0328  . pantoprazole (PROTONIX) EC tablet 40 mg  40 mg Oral BID AC Roddenberry, Myron G, PA-C   40 mg at 06/26/19 1012  . potassium chloride SA (K-DUR) CR tablet 20 mEq  20 mEq Oral Daily Roddenberry, Myron G, PA-C   20 mEq at 06/26/19 1011  . sodium chloride flush (NS) 0.9 % injection 3 mL  3 mL Intravenous Q12H Roddenberry, Myron G, PA-C   3 mL at 06/26/19 1015  . traMADol (ULTRAM) tablet 50 mg  50 mg Oral Q4H PRN Leary RocaRoddenberry, Myron G, PA-C   50 mg at 06/25/19 1631     Discharge Medications: Please see discharge summary for a list of discharge medications.  Relevant Imaging Results:  Relevant Lab Results:   Additional Information SSN: [id number]  Maree KrabbeBridget A Laurieanne Galloway, LCSW

## 2019-06-27 ENCOUNTER — Inpatient Hospital Stay (HOSPITAL_COMMUNITY): Payer: 59

## 2019-06-27 MED ORDER — SODIUM CHLORIDE 0.9 % IV SOLN
2.0000 g | INTRAVENOUS | Status: DC
  Administered 2019-06-27 – 2019-07-01 (×5): 2 g via INTRAVENOUS
  Filled 2019-06-27 (×2): qty 2
  Filled 2019-06-27: qty 20
  Filled 2019-06-27 (×3): qty 2

## 2019-06-27 NOTE — Plan of Care (Signed)

## 2019-06-27 NOTE — Progress Notes (Addendum)
[address]       [place] [id number]             [phone]                 4 Days Post-Op Procedure(s) (LRB): STERNAL WOUND DEBRIDEMENT (N/A) APPLICATION OF WOUND VAC (N/A)  LOS: 6 days   Subjective: Up to chair , patient somewhat somnolent  Objective: Vital signs in last 24 hours: Patient Vitals for the past 24 hrs:  BP Temp Temp src Pulse Resp SpO2 Weight  06/27/19 1014 (!) 155/71 - - 84 - - -  06/27/19 0719 (!) 163/80 98 F (36.7 C) Oral 93 (!) 24 98 % -  06/27/19 0351 - - - - - - 84.4 kg  06/27/19 0339 134/72 98.3 F (36.8 C) Oral 77 14 99 % -  06/26/19 2333 (!) 145/77 98.3 F (36.8 C) Oral 84 18 100 % -  06/26/19 2125 (!) 150/71 - - 82 - - -  06/26/19 1958 (!) 150/71 98.2 F (36.8 C) Oral 85 17 95 % -  06/26/19 1500 119/66 97.7 F (36.5 C) Oral 83 20 95 % -  06/26/19 1230 139/79 - - - 19 97 % -  06/26/19 1131 (!) 160/67 (!) 97.5 F (36.4 C) Oral 79 14 97 % -    Filed Weights   06/25/19 0415 06/26/19 0625 06/27/19 0351  Weight: 91.6 kg 89 kg 84.4 kg    Hemodynamic parameters for last 24 hours:    Intake/Output from previous day: 08/21 0701 - 08/22 0700 In: 480 [P.O.:480] Out: 2025 [Urine:2000; Drains:25] Intake/Output this shift: Total I/O In: 120 [P.O.:120] Out: 750 [Urine:750]  Scheduled Meds: . amLODipine  5 mg Oral Daily  . aspirin  325 mg Oral Daily  . atorvastatin  80 mg Oral q1800  . colchicine  0.6 mg Oral Daily  . feeding supplement (ENSURE ENLIVE)  237 mL Oral BID BM  . furosemide  40 mg Oral Daily  . ibuprofen  400 mg Oral BID  . metoprolol tartrate  25 mg Oral BID  . pantoprazole  40 mg Oral BID AC  . potassium chloride  20 mEq Oral Daily  . sodium chloride flush  3 mL Intravenous Q12H   Continuous Infusions: PRN Meds:.acetaminophen **OR** acetaminophen, docusate sodium, morphine injection, ondansetron **OR** ondansetron (ZOFRAN) IV, oxyCODONE-acetaminophen, traMADol  General appearance: alert and  cooperative Neurologic: intact Heart: regular rate and rhythm, S1, S2 normal, no murmur, click, rub or gallop Lungs: diminished breath sounds bibasilar Abdomen: soft, non-tender; bowel sounds normal; no masses,  no organomegaly Extremities: extremities normal, atraumatic, no cyanosis or edema and Homans sign is negative, no sign of DVT Lower sternal incision wound VAC in place and functioning  Lab Results: CBC: Recent Labs    06/25/19 0212  WBC 14.8*  HGB 8.2*  HCT 26.5*  PLT 618*   BMET:  Recent Labs    06/25/19 0212  NA 137  K 4.2  CL 106  CO2 22  GLUCOSE 131*  BUN 27*  CREATININE 1.15*  CALCIUM 8.5*    PT/INR: No results for input(s): LABPROT, INR in the last 72 hours.   Radiology Dg Chest Port 1 View  Result Date: 06/27/2019 CLINICAL DATA:  Status post chest tube removal. EXAM: PORTABLE CHEST 1 VIEW COMPARISON:  Chest radiograph 06/26/2019 FINDINGS: Monitoring leads overlie the patient. Stable cardiomegaly status post median sternotomy. Similar-appearing left mid lung linear opacities. Bibasilar atelectasis. No pleural  effusion. Persistent tiny left apical pneumothorax. Interval removal left chest tube. IMPRESSION: Interval removal left chest tube. Persistent tiny apical left pneumothorax. Electronically Signed   By: Lovey Newcomer M.D.   On: 06/27/2019 09:19   Dg Chest Port 1 View  Result Date: 06/26/2019 CLINICAL DATA:  Follow-up chest tube EXAM: PORTABLE CHEST 1 VIEW COMPARISON:  06/24/2019 FINDINGS: Cardiac shadow is enlarged. Postsurgical changes are again seen. Left-sided thoracostomy catheter is again noted with tiny apical pneumothorax stable in appearance from the prior exam. Mild atelectatic changes in the left base are seen. Small effusions remain. IMPRESSION: Tiny left apical pneumothorax. Stable atelectatic changes in the left base. Small effusions remain. Electronically Signed   By: Inez Catalina M.D.   On: 06/26/2019 08:13     Assessment/Plan: S/P  Procedure(s) (LRB): STERNAL WOUND DEBRIDEMENT (N/A) APPLICATION OF WOUND VAC (N/A) Mobilize Stable after removal of left chest tube, with tiny residual apical pneumothorax Patient still weak not ready for discharge yet, no care at home will need SNF   Kimberly Isaac MD 06/27/2019 11:12 AM     Patient ID: Kimberly Bryant, female   DOB: [birth year], 63 y.o.   MRN: [id number]

## 2019-06-28 LAB — AEROBIC/ANAEROBIC CULTURE W GRAM STAIN (SURGICAL/DEEP WOUND)

## 2019-06-28 NOTE — Plan of Care (Signed)

## 2019-06-28 NOTE — Progress Notes (Signed)
Patient ID: Kimberly Bryant, female   DOB: [birth year], 63 y.o.   MRN: [id number]      [place] [address]       [place] [address]             [phone]                 5 Days Post-Op Procedure(s) (LRB): STERNAL WOUND DEBRIDEMENT (N/A) APPLICATION OF WOUND VAC (N/A)  LOS: 7 days   Subjective: Feels better today   Objective: Vital signs in last 24 hours: Patient Vitals for the past 24 hrs:  BP Temp Temp src Pulse Resp SpO2 Weight  06/28/19 0744 (!) 148/74 98.2 F (36.8 C) Oral - 17 - -  06/28/19 0500 - - - - - - 84.6 kg  06/28/19 0300 134/72 98.3 F (36.8 C) Oral 75 15 99 % -  06/27/19 2300 124/67 98 F (36.7 C) Oral 84 15 99 % -  06/27/19 2005 114/63 - - - 20 99 % -  06/27/19 1900 114/63 97.7 F (36.5 C) Oral 86 20 99 % -  06/27/19 1805 113/61 98.4 F (36.9 C) Oral - - 97 % -    Filed Weights   06/26/19 0625 06/27/19 0351 06/28/19 0500  Weight: 89 kg 84.4 kg 84.6 kg    Hemodynamic parameters for last 24 hours:    Intake/Output from previous day: 08/22 0701 - 08/23 0700 In: 220.6 [P.O.:120; IV Piggyback:100.6] Out: 750 [Urine:750] Intake/Output this shift: No intake/output data recorded.  Scheduled Meds: . amLODipine  5 mg Oral Daily  . aspirin  325 mg Oral Daily  . atorvastatin  80 mg Oral q1800  . colchicine  0.6 mg Oral Daily  . feeding supplement (ENSURE ENLIVE)  237 mL Oral BID BM  . furosemide  40 mg Oral Daily  . ibuprofen  400 mg Oral BID  . metoprolol tartrate  25 mg Oral BID  . pantoprazole  40 mg Oral BID AC  . potassium chloride  20 mEq Oral Daily  . sodium chloride flush  3 mL Intravenous Q12H   Continuous Infusions: . cefTRIAXone (ROCEPHIN)  IV Stopped (06/27/19 1829)   PRN Meds:.acetaminophen **OR** acetaminophen, docusate sodium, morphine injection, ondansetron **OR** ondansetron (ZOFRAN) IV, oxyCODONE-acetaminophen, traMADol  General appearance: alert and cooperative Neurologic: intact Heart: regular rate and rhythm, S1, S2  normal, no murmur, click, rub or gallop Lungs: diminished breath sounds bibasilar Abdomen: soft, non-tender; bowel sounds normal; no masses,  no organomegaly Extremities: extremities normal, atraumatic, no cyanosis or edema and Homans sign is negative, no sign of DVT Wound: wound vac intact  Lab Results: CBC:No results for input(s): WBC, HGB, HCT, PLT in the last 72 hours. BMET: No results for input(s): NA, K, CL, CO2, GLUCOSE, BUN, CREATININE, CALCIUM in the last 72 hours.  PT/INR: No results for input(s): LABPROT, INR in the last 72 hours.   Radiology Dg Chest Port 1 View  Result Date: 06/27/2019 CLINICAL DATA:  Status post chest tube removal. EXAM: PORTABLE CHEST 1 VIEW COMPARISON:  Chest radiograph 06/26/2019 FINDINGS: Monitoring leads overlie the patient. Stable cardiomegaly status post median sternotomy. Similar-appearing left mid lung linear opacities. Bibasilar atelectasis. No pleural effusion. Persistent tiny left apical pneumothorax. Interval removal left chest tube. IMPRESSION: Interval removal left chest tube. Persistent tiny apical left pneumothorax. Electronically Signed   By: Lovey Newcomer M.D.   On: 06/27/2019 09:19   Aerobic/Anaerobic Culture (surgical/deep wound)     Status: None   Collection Time:  06/23/19  6:10 PM   Specimen: Wound  Result Value Ref Range Status   Specimen Description WOUND  Final   Special Requests STERUM PT ON VANC ,CEFEVINE  Final   Gram Stain   Final    RARE WBC PRESENT, PREDOMINANTLY PMN NO ORGANISMS SEEN    Culture   Final    RARE MORGANELLA MORGANII NO ANAEROBES ISOLATED Performed at [facility], [address] [place], [place] [address]    Report Status 06/28/2019 FINAL  Final   Organism ID, Bacteria MORGANELLA MORGANII  Final      Susceptibility   Morganella morganii - MIC*    AMPICILLIN >=32 RESISTANT Resistant     CEFAZOLIN >=64 RESISTANT Resistant     CEFEPIME <=1 SENSITIVE Sensitive     CEFTAZIDIME <=1 SENSITIVE Sensitive      CEFTRIAXONE <=1 SENSITIVE Sensitive     CIPROFLOXACIN 1 SENSITIVE Sensitive     GENTAMICIN 4 SENSITIVE Sensitive     IMIPENEM 1 SENSITIVE Sensitive     TRIMETH/SULFA >=320 RESISTANT Resistant     AMPICILLIN/SULBACTAM 16 INTERMEDIATE Intermediate     PIP/TAZO <=4 SENSITIVE Sensitive     * RARE MORGANELLA MORGANII    Assessment/Plan: S/P Procedure(s) (LRB): STERNAL WOUND DEBRIDEMENT (N/A) APPLICATION OF WOUND VAC (N/A) Waiting for SNF- no care at home  Started on Rocephin     Delight OvensEdward B Letricia Krinsky MD 06/28/2019 10:46 AM

## 2019-06-29 ENCOUNTER — Inpatient Hospital Stay (HOSPITAL_COMMUNITY): Payer: 59

## 2019-06-29 NOTE — TOC Initial Note (Addendum)
Transition of Care [facility]) - Initial/Assessment Note    Patient Details  Name: Kimberly Bryant MRN: [id number] Date of Birth: 1956-04-09  [facility] CM/SW Contact:    Alberteen Sam, [place] Phone Number: [phone] 06/29/2019, 1:32 PM  Clinical Narrative:                  CSW met with patient with interpreter to discuss SNF bed offers. CSW informed patient of bed offers from [facility] and [facility]. Patient reports she would like her daughter involved in decision, she attempted to call her daughter with no answer. Patient requested CSW ask daughter for SNF choice.   CSW attempted to call daughter after meeting, still no answer. Pending call back to discuss offers. Insurance authorization will need to be initiated once choice is made.   Update: Daughter chose [facility], they started insurance authorization today.   Expected Discharge Plan: Skilled Nursing Facility Barriers to Discharge: Insurance Authorization, Continued Medical Work up   Patient Goals and CMS Choice Patient states their goals for this hospitalization and ongoing recovery are:: to get better CMS Medicare.gov Compare Post Acute Care list provided to:: Patient Choice offered to / list presented to : Patient  Expected Discharge Plan and Services Expected Discharge Plan: [place] In-house Referral: NA Discharge Planning Services: CM Consult Post Acute Care Choice: [place] Living arrangements for the past 2 months: Single Family Home                 DME Arranged: Negative pressure wound device DME Agency: [employer] Date DME Agency Contacted: 06/25/19 Time DME Agency Contacted: 1526 Representative spoke with at DME Agency: Olivia Mackie HH Arranged: RN [place] Agency: [facility] [employer]     Representative spoke with at [place]: [place] Arrangements/Services Living arrangements for the past 2 months: [place] with::  Self Patient language and need for interpreter reviewed:: Yes Do you feel safe going back to the place where you live?: No   needs short term rehab  Need for Family Participation in Patient Care: Yes (Comment) Care giver support system in place?: Yes (comment)   Criminal Activity/Legal Involvement Pertinent to Current Situation/Hospitalization: No - Comment as needed  Activities of Daily Living Home Assistive Devices/Equipment: Walker (specify type) ADL Screening (condition at time of admission) Patient's cognitive ability adequate to safely complete daily activities?: Yes Is the patient deaf or have difficulty hearing?: No Does the patient have difficulty seeing, even when wearing glasses/contacts?: No Does the patient have difficulty concentrating, remembering, or making decisions?: No Patient able to express need for assistance with ADLs?: Yes Does the patient have difficulty dressing or bathing?: No Independently performs ADLs?: Yes (appropriate for developmental age) Does the patient have difficulty walking or climbing stairs?: Yes Weakness of Legs: Both Weakness of Arms/Hands: None  Permission Sought/Granted Permission sought to share information with : Case Manager, Customer service manager, Family Supports Permission granted to share information with : Yes, Verbal Permission Granted  Share Information with NAME: Cedricka  Permission granted to share info w AGENCY: SNFs  Permission granted to share info w Relationship: daughter  Permission granted to share info w Contact Information: [phone]  Emotional Assessment Appearance:: Appears stated age Attitude/Demeanor/Rapport: Gracious Affect (typically observed): Calm Orientation: : Oriented to Self, Oriented to Place, Oriented to  Time, Oriented to Situation Alcohol / Substance Use: Not Applicable Psych Involvement: No (comment)  Admission diagnosis:  Pleural effusion [J90] S/P  CABG (coronary artery bypass graft)  [Z95.1] Wound dehiscence [T81.30XA] Patient Active Problem List   Diagnosis Date Noted  . Pleural effusion on left 06/21/2019  . 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 Atypical chest pain   . Elevated troponin   . Hypertensive urgency   . NSTEMI (non-ST elevated myocardial infarction) ([place])   . ACS (acute coronary syndrome) ([place]) 06/08/2019  . CAD (coronary artery disease) 09/15/2018  . HTN (hypertension) 09/15/2018  . GERD (gastroesophageal reflux disease) 09/15/2018  . Chest pain, atypical 09/15/2018  . Hyperlipidemia 09/15/2018   PCP:  Antony Blackbird, MD Pharmacy:   [facility] [address] [place] [address] Phone: [phone] Fax: [phone]     Social Determinants of Health (SDOH) Interventions    Readmission Risk Interventions Readmission Risk Prevention Plan 06/16/2019  Transportation Screening Complete  PCP or Specialist Appt within 5-7 Days Complete  Home Care Screening Complete  Medication Review (RN CM) Complete

## 2019-06-29 NOTE — Progress Notes (Signed)
Occupational Therapy Treatment Patient Details Name: Kimberly Bryant MRN: [id number] DOB: 03/26/1956 Today's Date: 06/29/2019    History of present illness Pt is a 63 y/o female presenting to ED on 8/16 with large L pleura effusion with CT placement and dehised chest incision with debridement and wound vac placed on 8/18. 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    OT comments  Patient agreeable to OT session. Completing toilet transfers and toileting with min guard, in room mobility with min guard and grooming at sink with min guard.  She requires cueing for sternal precautions functionally, reviewed with pt initially.  Utilized interpreter throughout session.    Follow Up Recommendations  SNF;Supervision/Assistance - 24 hour    Equipment Recommendations  3 in 1 bedside commode(pt may already have)    Recommendations for Other Services      Precautions / Restrictions Precautions Precautions: Fall;Sternal Precaution Comments: reviewed precautions with pt,  wound vac Restrictions Other Position/Activity Restrictions: sternal precautions        Mobility Bed Mobility               General bed mobility comments: OOB in recliner upon entry  Transfers Overall transfer level: Needs assistance Equipment used: Rolling walker (2 wheeled) Transfers: Sit to/from Stand Sit to Stand: Min guard         General transfer comment: min guard for balance/safety, cueing for sternal precautions and hand placement    Balance Overall balance assessment: Needs assistance Sitting-balance support: No upper extremity supported;Feet supported Sitting balance-Leahy Scale: Fair     Standing balance support: Bilateral upper extremity supported;During functional activity Standing balance-Leahy Scale: Poor Standing balance comment: reliant on BUE support dynamically                           ADL either performed or assessed with clinical judgement   ADL Overall ADL's : Needs  assistance/impaired     Grooming: Min guard;Standing           Upper Body Dressing : Minimal assistance;Sitting   Lower Body Dressing: Moderate assistance;Sit to/from stand   Toilet Transfer: Minimal assistance;Ambulation;BSC;RW Toilet Transfer Details (indicate cue type and reason): cueing for hand placement and sternal precautions  Toileting- Clothing Manipulation and Hygiene: Min guard;Sit to/from stand Toileting - Clothing Manipulation Details (indicate cue type and reason): cueing for sternal precautions     Functional mobility during ADLs: Rolling walker;Min guard General ADL Comments: cueing for sternal precautions throughout session     Vision       Perception     Praxis      Cognition Arousal/Alertness: Awake/alert Behavior During Therapy: Flat affect Overall Cognitive Status: Difficult to assess Area of Impairment: Attention;Following commands;Problem solving;Awareness                   Current Attention Level: Sustained   Following Commands: Follows one step commands with increased time   Awareness: Emergent Problem Solving: Slow processing;Decreased initiation;Difficulty sequencing;Requires verbal cues;Requires tactile cues General Comments: poor generalization of sternal precautions        Exercises     Shoulder Instructions       General Comments      Pertinent Vitals/ Pain       Pain Assessment: Faces Faces Pain Scale: No hurt  Home Living  Prior Functioning/Environment              Frequency  Min 2X/week        Progress Toward Goals  OT Goals(current goals can now be found in the care plan section)  Progress towards OT goals: Progressing toward goals  Acute Rehab OT Goals Patient Stated Goal: none stated  OT Goal Formulation: With patient  Plan Discharge plan needs to be updated;Frequency remains appropriate    Co-evaluation                  AM-PAC OT "6 Clicks" Daily Activity     Outcome Measure   Help from another person eating meals?: None Help from another person taking care of personal grooming?: A Little Help from another person toileting, which includes using toliet, bedpan, or urinal?: A Little Help from another person bathing (including washing, rinsing, drying)?: A Lot Help from another person to put on and taking off regular upper body clothing?: A Little Help from another person to put on and taking off regular lower body clothing?: A Lot 6 Click Score: 17    End of Session Equipment Utilized During Treatment: Rolling walker  OT Visit Diagnosis: Other abnormalities of gait and mobility (R26.89);Muscle weakness (generalized) (M62.81);Other symptoms and signs involving cognitive function   Activity Tolerance Patient tolerated treatment well   Patient Left Other (comment)(handoff to PT)   Nurse Communication Mobility status        Time: 1308-6578 OT Time Calculation (min): 19 min  Charges: OT General Charges $OT Visit: 1 Visit OT Treatments $Self Care/Home Management : 8-22 mins  Delight Stare, [place] Pager [phone] Office [phone]    Delight Stare 06/29/2019, 4:05 PM

## 2019-06-29 NOTE — Progress Notes (Signed)
      [name]       [place] [address]             [phone]      6 Days Post-Op Procedure(s) (LRB): STERNAL WOUND DEBRIDEMENT (N/A) APPLICATION OF WOUND VAC (N/A) Subjective: Feels okay. No chest pain or SOB. Walked all around the unit this morning. She feels like she is making forward progress. Interrupter used during entire encounter.   Objective: Vital signs in last 24 hours: Temp:  [97.9 F (36.6 C)-98.7 F (37.1 C)] 98.7 F (37.1 C) (08/24 0757) Pulse Rate:  [82-95] 95 (08/24 0757) Cardiac Rhythm: Normal sinus rhythm (08/24 0400) Resp:  [17-23] 18 (08/24 0757) BP: (92-142)/(56-83) 135/78 (08/24 0757) SpO2:  [98 %-100 %] 98 % (08/24 0757) Weight:  [85.8 kg] 85.8 kg (08/24 0500)     Intake/Output from previous day: No intake/output data recorded. Intake/Output this shift: No intake/output data recorded.  General appearance: alert, cooperative and no distress Heart: regular rate and rhythm, S1, S2 normal, no murmur, click, rub or gallop Lungs: clear to auscultation bilaterally Abdomen: soft, non-tender; bowel sounds normal; no masses,  no organomegaly Extremities: extremities normal, atraumatic, no cyanosis or edema Wound: clean and dry. Wound vac with good suction  Lab Results: No results for input(s): WBC, HGB, HCT, PLT in the last 72 hours. BMET: No results for input(s): NA, K, CL, CO2, GLUCOSE, BUN, CREATININE, CALCIUM in the last 72 hours.  PT/INR: No results for input(s): LABPROT, INR in the last 72 hours. ABG    Component Value Date/Time   PHART 7.312 (L) 06/10/2019 1947   HCO3 23.1 06/10/2019 1947   TCO2 22 06/10/2019 2014   ACIDBASEDEF 3.0 (H) 06/10/2019 1947   O2SAT 97.0 06/10/2019 1947   CBG (last 3)  No results for input(s): GLUCAP in the last 72 hours.  Assessment/Plan: S/P Procedure(s) (LRB): STERNAL WOUND DEBRIDEMENT (N/A) APPLICATION OF WOUND VAC (N/A)  1. 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  2. Hemodynamically stable and in NSR.  3. Tolerating room air with good oxygen saturation.  4. H and H 8.2/26.5, expecteda cute blood loss anemia 5. GI- did better with oral intake since her daughter brought food from home over the weekend. Ensure ordered.  6. Renal function stable.   7. Consult to wound care nursing for vac change M,W,F  Plan: SNF since her family works nights. Waiting on insurance approval which can take several days per case management. Continue abx for sternal infection. Will try to speak with daughter today about the care plan-She will be at the hospital at noon.     LOS: 8 days    Elgie Collard 06/29/2019

## 2019-06-29 NOTE — Progress Notes (Signed)
Physical Therapy Treatment Patient Details Name: Kimberly Bryant MRN: [id number] DOB: 11/19/1955 Today's Date: Bryant    History of Present Illness Pt is a 63 y/o female presenting to ED on 8/16 with large L pleura effusion with CT placement and dehised chest incision with debridement and wound vac placed on 8/18. 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     PT Comments    Pt showing steady improvement with no physical assistance needed at this point.  Emphasis on sternal precaution and progressing gait stability and stamina.    Follow Up Recommendations  Home health PT;Supervision/Assistance - 24 hour     Equipment Recommendations       Recommendations for Other Services       Precautions / Restrictions Precautions Precautions: Fall;Sternal Precaution Booklet Issued: Yes (comment) Precaution Comments: reviewed precautions with pt,  wound vac Restrictions Other Position/Activity Restrictions: sternal precautions     Mobility  Bed Mobility               General bed mobility comments: OOB  with OT  Transfers Overall transfer level: Needs assistance Equipment used: Rolling walker (2 wheeled) Transfers: Sit to/from Stand Sit to Stand: Supervision         General transfer comment: no overt use of hands, but cued via interpreter to limit use of UE's  Ambulation/Gait Ambulation/Gait assistance: Supervision Gait Distance (Feet): 650 Feet Assistive device: Rolling walker (2 wheeled) Gait Pattern/deviations: Step-through pattern Gait velocity: moderate, but interpreter shut down and could not work on cadence increases.   General Gait Details: generally steady, light use of the RW.  VSS, HR in the 80's and sats in the upper 90's   Stairs             Wheelchair Mobility    Modified Rankin (Stroke Patients Only)       Balance Overall balance assessment: Needs assistance Sitting-balance support: No upper extremity supported;Feet supported Sitting  balance-Leahy Scale: Fair     Standing balance support: Bilateral upper extremity supported;During functional activity Standing balance-Leahy Scale: Fair Standing balance comment: reliant on BUE support dynamically                            Cognition Arousal/Alertness: Awake/alert Behavior During Therapy: WFL for tasks assessed/performed Overall Cognitive Status: Difficult to assess Area of Impairment: Attention;Following commands;Problem solving;Awareness                   Current Attention Level: Sustained   Following Commands: Follows one step commands with increased time   Awareness: Emergent Problem Solving: Slow processing;Decreased initiation;Difficulty sequencing;Requires verbal cues;Requires tactile cues General Comments: poor generalization of sternal precautions      Exercises      General Comments        Pertinent Vitals/Pain Pain Assessment: Faces Faces Pain Scale: No hurt    Home Living                      Prior Function            PT Goals (current goals can now be found in the care plan section) Acute Rehab PT Goals Patient Stated Goal: none stated  PT Goal Formulation: With patient Time For Goal Achievement: 07/09/19 Potential to Achieve Goals: Good Progress towards PT goals: Progressing toward goals    Frequency    Min 3X/week      PT Plan Current plan remains appropriate  Co-evaluation              AM-PAC PT "6 Clicks" Mobility   Outcome Measure  Help needed turning from your back to your side while in a flat bed without using bedrails?: A Little Help needed moving from lying on your back to sitting on the side of a flat bed without using bedrails?: A Little Help needed moving to and from a bed to a chair (including a wheelchair)?: A Little Help needed standing up from a chair using your arms (e.g., wheelchair or bedside chair)?: None Help needed to walk in hospital room?: None Help needed  climbing 3-5 steps with a railing? : A Little 6 Click Score: 20    End of Session   Activity Tolerance: Patient tolerated treatment well Patient left: in chair;with call bell/phone within reach Nurse Communication: Mobility status PT Visit Diagnosis: Other abnormalities of gait and mobility (R26.89)     Time: [id number] PT Time Calculation (min) (ACUTE ONLY): 17 min  Charges:  $Gait Training: 8-22 mins                     [place]  [place] BingKen Rimas Bryant, PT Acute Rehabilitation Services [phone]  (pager) [phone]  (office)   Kimberly Bryant, 4:34 PM

## 2019-06-29 NOTE — Consult Note (Addendum)
[place]  Nurse wound consult note Reason for Consult: Consult requested for Vac dressing change.  Pt is followed by CT surgical team for plan of care. Wound type: Full thickness post-op wound to midline sternum Measurement: 2X1X2cm Wound bed: 100% beefy red Drainage (amount, consistency, odor) small amt pink drainage, no odor  Periwound: intact skin surrounding, closed well-approximated surgical incision above the open site Dressing procedure/placement/frequency: Applied one piece narrow black foam to inner wound bed, then another wider piece to allow suction from the track pad.  Cont suction on at 156mm.  Pt was medicated for pain prior to the procedure and tolerated with minimal amt discomfort. [place] team will plan to change Q M/W/F while pt is in the hospital. Julien Girt MSN, RN, [place], [place], [place]

## 2019-06-29 NOTE — Progress Notes (Signed)
      [address]       [place] [address]             [phone]       Discussed care plan with daughter at the bedside. M,W,F wound vac changes. Then wet-to-dry when wound becomes more superficial. All medications would be transferred over to the facility. Continue Rocephin for now but will need to transition to oral ABX for discharge.   Case management will provide details on facilities and discharge timing estimates to the family.   CXR stable today with no apparent increase in pleural effusion on the left.   Follow-up appointment has been moved to:   07/10/2019 at 2:00pm.   Nicholes Rough, PA-C

## 2019-06-30 NOTE — Plan of Care (Signed)

## 2019-06-30 NOTE — Progress Notes (Signed)
      [address]       ,[place] [address]             [phone]        7 Days Post-Op Procedure(s) (LRB): STERNAL WOUND DEBRIDEMENT (N/A) APPLICATION OF WOUND VAC (N/A)  Subjective: Patient asleep and awakened. She has some pain at wound VAC site.  Objective: Vital signs in last 24 hours: Temp:  [98 F (36.7 C)-98.7 F (37.1 C)] 98 F (36.7 C) (08/25 0300) Pulse Rate:  [72-95] 80 (08/25 0300) Cardiac Rhythm: Normal sinus rhythm (08/24 2000) Resp:  [12-30] 12 (08/25 0300) BP: (120-146)/(64-90) 139/90 (08/25 0300) SpO2:  [97 %-100 %] 99 % (08/25 0300) Weight:  [85.8 kg] 85.8 kg (08/25 0500)   Current Weight  06/30/19 85.8 kg      Intake/Output from previous day: 08/24 0701 - 08/25 0700 In: -  Out: 50 [Drains:50]   Physical Exam:  Cardiovascular: RRR Pulmonary: Clear to auscultation bilaterally Abdomen: Soft, non tender, bowel sounds present. Extremities: Trace bilateral lower extremity edema. Wounds: Clean and dry.  No erythema or signs of infection. Wound Vac on lower sternal wound  Lab Results: CBC:No results for input(s): WBC, HGB, HCT, PLT in the last 72 hours. BMET: No results for input(s): NA, K, CL, CO2, GLUCOSE, BUN, CREATININE, CALCIUM in the last 72 hours.  PT/INR:  Lab Results  Component Value Date   INR 1.5 (H) 06/10/2019   ABG:  INR: Will add last result for INR, ABG once components are confirmed Will add last 4 CBG results once components are confirmed  Assessment/Plan:  1. CV - SR in the 80's.  On Lopressor 25 mg bid and Amlodipine 5 mg daily. 2.  Pulmonary - On room air. 3. Volume Overload - On Lasix 40 mg daily 4.  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 Wound Vac changes M/W/Fri. Once wound granulated further, will change dressings to wet-to-dry. 6. To SNF when bed available   Donielle M ZimmermanPA-C 06/30/2019,7:33 AM [phone]

## 2019-07-01 LAB — SARS CORONAVIRUS 2 (TAT 6-24 HRS): SARS Coronavirus 2: NEGATIVE

## 2019-07-01 NOTE — Progress Notes (Signed)
CSW followed up with [facility], they report having auth. Patient can discharge to SNF pending COVID test results. Please notify CSW once results are in.   [place], [place]

## 2019-07-01 NOTE — Progress Notes (Signed)
Physical Therapy Treatment Patient Details Name: Kimberly Bryant MRN: [id number] DOB: 01/10/1956 Today's Date: 07/01/2019    History of Present Illness Pt is a 63 y/o female presenting to ED on 8/16 with large L pleura effusion with CT placement and dehised chest incision with debridement and wound vac placed on 8/18. 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     PT Comments    Pt admitted with above diagnosis. Pt was able to ambulate with supervision on unit following precautions as well.  Pt needed min guard assist with challenges to balance when turning head. Progressing well.  Pt very flat affect and appears sad.  Will continue to follow acutetly.  Pt currently with functional limitations due to the deficits listed below (see PT Problem List). Pt will benefit from skilled PT to increase their independence and safety with mobility to allow discharge to the venue listed below.     Follow Up Recommendations  Home health PT;Supervision/Assistance - 24 hour     Equipment Recommendations  3in1 (PT)(if she doesn't have one)    Recommendations for Other Services       Precautions / Restrictions Precautions Precautions: Fall;Sternal Precaution Booklet Issued: Yes (comment) Precaution Comments: reviewed precautions with pt,  wound vac Restrictions Weight Bearing Restrictions: No Other Position/Activity Restrictions: sternal precautions     Mobility  Bed Mobility Overal bed mobility: Needs Assistance Bed Mobility: Rolling;Sidelying to Sit Rolling: Min assist Sidelying to sit: Min assist       General bed mobility comments: Needed a little assist to come to EOB  Transfers Overall transfer level: Needs assistance Equipment used: Rolling walker (2 wheeled) Transfers: Sit to/from Stand Sit to Stand: Supervision         General transfer comment: no overt use of hands, but cued via interpreter to limit use of UE's  Ambulation/Gait Ambulation/Gait assistance: Supervision Gait  Distance (Feet): 350 Feet Assistive device: Rolling walker (2 wheeled) Gait Pattern/deviations: Step-through pattern;Decreased stride length   Gait velocity interpretation: <1.8 ft/sec, indicate of risk for recurrent falls General Gait Details: generally steady, light use of the RW.  VSS, HR in the 80's and sats in the upper 90's   Stairs             Wheelchair Mobility    Modified Rankin (Stroke Patients Only)       Balance Overall balance assessment: Needs assistance Sitting-balance support: No upper extremity supported;Feet supported Sitting balance-Leahy Scale: Fair     Standing balance support: Bilateral upper extremity supported;During functional activity Standing balance-Leahy Scale: Fair Standing balance comment: reliant on BUE support dynamically                            Cognition Arousal/Alertness: Awake/alert Behavior During Therapy: WFL for tasks assessed/performed Overall Cognitive Status: Difficult to assess Area of Impairment: Attention;Following commands;Problem solving;Awareness                   Current Attention Level: Sustained   Following Commands: Follows one step commands with increased time   Awareness: Emergent Problem Solving: Slow processing;Decreased initiation;Difficulty sequencing;Requires verbal cues;Requires tactile cues General Comments: poor generalization of sternal precautions      Exercises      General Comments General comments (skin integrity, edema, etc.): VSS on RA      Pertinent Vitals/Pain Pain Assessment: No/denies pain    Home Living  Prior Function            PT Goals (current goals can now be found in the care plan section) Acute Rehab PT Goals Patient Stated Goal: none stated  Progress towards PT goals: Progressing toward goals    Frequency    Min 3X/week      PT Plan Current plan remains appropriate    Co-evaluation               AM-PAC PT "6 Clicks" Mobility   Outcome Measure  Help needed turning from your back to your side while in a flat bed without using bedrails?: A Little Help needed moving from lying on your back to sitting on the side of a flat bed without using bedrails?: A Little Help needed moving to and from a bed to a chair (including a wheelchair)?: A Little Help needed standing up from a chair using your arms (e.g., wheelchair or bedside chair)?: None Help needed to walk in hospital room?: None Help needed climbing 3-5 steps with a railing? : A Little 6 Click Score: 20    End of Session Equipment Utilized During Treatment: Gait belt Activity Tolerance: Patient tolerated treatment well Patient left: in chair;with call bell/phone within reach Nurse Communication: Mobility status PT Visit Diagnosis: Other abnormalities of gait and mobility (R26.89)     Time: 1308-65781216-1235 PT Time Calculation (min) (ACUTE ONLY): 19 min  Charges:  $Gait Training: 8-22 mins                     Blessings Inglett,PT Acute Rehabilitation Services Pager:  [phone]  Office:  [phone]     Berline LopesDawn F Aowyn Rozeboom 07/01/2019, 1:45 PM

## 2019-07-01 NOTE — Consult Note (Signed)
[place] Nurse wound follow up Wound type: surgical  Measurement: 2.5cm x 1.5cm x 2.0cm  Wound bed: pink, clean Drainage (amount, consistency, odor) minimal, no odor Periwound: intact  Dressing procedure/placement/frequency: Removed old NPWT dressing Periwound skin protected with window framed with drape Filled wound with 2 pieces of black foam. Sealed NPWT dressing at 151mm HG Patient received IV pain medication per bedside nurse prior to dressing change Patient tolerated procedure well  Ok for bedside nurse to change non-complex NPWT dressing M/W/F    WOC nurse will continue to provide NPWT dressing changed due to the complexity of the dressing change.    [place], [place], [place]

## 2019-07-01 NOTE — Progress Notes (Addendum)
      [address]       [place] [address]             [phone]      8 Days Post-Op Procedure(s) (LRB): STERNAL WOUND DEBRIDEMENT (N/A) APPLICATION OF WOUND VAC (N/A) Subjective: Interpreter used during encounter. She had a dizzy spell yesterday around noon. It appeared her vitals were stable at that time. She also states she did not walk at all yesterday.   Objective: Vital signs in last 24 hours: Temp:  [98.1 F (36.7 C)-98.7 F (37.1 C)] 98.1 F (36.7 C) (08/26 0334) Pulse Rate:  [73-95] 83 (08/26 0334) Cardiac Rhythm: Normal sinus rhythm (08/26 0334) Resp:  [13-24] 16 (08/26 0334) BP: (108-151)/(63-86) 108/67 (08/26 0334) SpO2:  [98 %-100 %] 98 % (08/26 0334) Weight:  [86.5 kg] 86.5 kg (08/26 0334)     Intake/Output from previous day: 08/25 0701 - 08/26 0700 In: 130 [P.O.:125; I.V.:5] Out: 50 [Drains:50] Intake/Output this shift: No intake/output data recorded.  General appearance: alert, cooperative and no distress Heart: regular rate and rhythm, S1, S2 normal, no murmur, click, rub or gallop Lungs: clear to auscultation bilaterally Abdomen: soft, non-tender; bowel sounds normal; no masses,  no organomegaly Extremities: 1+ nonpitting edema in upper and lower extremity Wound: clean and dry, wound vac with good suction  Lab Results: No results for input(s): WBC, HGB, HCT, PLT in the last 72 hours. BMET: No results for input(s): NA, K, CL, CO2, GLUCOSE, BUN, CREATININE, CALCIUM in the last 72 hours.  PT/INR: No results for input(s): LABPROT, INR in the last 72 hours. ABG    Component Value Date/Time   PHART 7.312 (L) 06/10/2019 1947   HCO3 23.1 06/10/2019 1947   TCO2 22 06/10/2019 2014   ACIDBASEDEF 3.0 (H) 06/10/2019 1947   O2SAT 97.0 06/10/2019 1947   CBG (last 3)  No results for input(s): GLUCAP in the last 72 hours.  Assessment/Plan: S/P Procedure(s) (LRB): STERNAL WOUND DEBRIDEMENT (N/A) APPLICATION OF WOUND VAC (N/A)  1. CV  - SR in the 80's.  On Lopressor 25 mg bid and Amlodipine 5 mg daily. 2.  Pulmonary - On room air. 3. Volume Overload - On Lasix 40 mg daily 4.  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 Wound Vac changes M/W/Fri. Once wound granulated further, will change dressings to wet-to-dry. 6.Family has chosen [facility] for SNF. Insurance Josem Kaufmann process has begun. Caryl Pina has been following and providing updates.  7. Dizziness-occurred x 1. Unsure of reason. Vitals stable.  Will order labs for the morning to check H and H, electrolytes ect. Seems to be eating okay-finished her ensure this morning and about half her breakfast.   Plan: Await insurance auth for [facility], Wound vac change today. May be able to just do wet-to-dry dressing on d/c.    LOS: 10 days    Elgie Collard 07/01/2019   Pt seen and examined and interviewed via interpreter. She does not voice complaints and is awaiting SNF availability. Agree with PA Harriet Pho note and plan.

## 2019-07-01 NOTE — Progress Notes (Signed)
      [address]       [place] [address]             [phone]      COVID test sent earlier today. Results likely by tonight. Will plan for possible discharge tomorrow to [facility] if okay with family. I plan to talk with the daughter later today and communicate the plan of care.    Nicholes Rough, PA-C

## 2019-07-01 NOTE — Progress Notes (Addendum)
Update: After speaking with patient, patient's daughter reports they will be able to work through not being able to see patient while at SNF, and reports she is still in agreement with discharge plan of SNF.     Patient's daughter Jasha called CSW and reported concerns with not being able to see her mother while she is in the SNF. CSW explained this is for the health and safety of the SNF residents. Patient's daughter reports she is unsure if she will be able to go through this without seeing her mother, she reports she will call her mother and discuss. She will call CSW back with their decision.   [place], [place]

## 2019-07-02 LAB — CBC
HCT: 27.6 % — ABNORMAL LOW (ref 36.0–46.0)
Hemoglobin: 8.2 g/dL — ABNORMAL LOW (ref 12.0–15.0)
MCH: 25.9 pg — ABNORMAL LOW (ref 26.0–34.0)
MCHC: 29.7 g/dL — ABNORMAL LOW (ref 30.0–36.0)
MCV: 87.3 fL (ref 80.0–100.0)
Platelets: 490 10*3/uL — ABNORMAL HIGH (ref 150–400)
RBC: 3.16 MIL/uL — ABNORMAL LOW (ref 3.87–5.11)
RDW: 13.9 % (ref 11.5–15.5)
WBC: 6.3 10*3/uL (ref 4.0–10.5)
nRBC: 0 % (ref 0.0–0.2)

## 2019-07-02 LAB — BASIC METABOLIC PANEL
Anion gap: 8 (ref 5–15)
BUN: 14 mg/dL (ref 8–23)
CO2: 28 mmol/L (ref 22–32)
Calcium: 8.7 mg/dL — ABNORMAL LOW (ref 8.9–10.3)
Chloride: 106 mmol/L (ref 98–111)
Creatinine, Ser: 0.92 mg/dL (ref 0.44–1.00)
GFR calc Af Amer: >60 mL/min (ref >60)
GFR calc non Af Amer: >60 mL/min (ref >60)
Glucose, Bld: 99 mg/dL (ref 70–99)
Potassium: 3.8 mmol/L (ref 3.5–5.1)
Sodium: 142 mmol/L (ref 135–145)

## 2019-07-02 MED ORDER — ACETAMINOPHEN 325 MG PO TABS: 650.0000 mg | ORAL_TABLET | Freq: Four times a day (QID) | ORAL | 1 refills | Status: DC | PRN

## 2019-07-02 MED ORDER — FUROSEMIDE 40 MG PO TABS: 40.0000 mg | ORAL_TABLET | Freq: Every day | ORAL | 0 refills | Status: DC

## 2019-07-02 MED ORDER — COLCHICINE 0.6 MG PO TABS: 0.6000 mg | ORAL_TABLET | Freq: Every day | ORAL | 1 refills | Status: DC

## 2019-07-02 MED ORDER — AMLODIPINE BESYLATE 5 MG PO TABS: 5.0000 mg | ORAL_TABLET | Freq: Every day | ORAL | 1 refills | Status: DC

## 2019-07-02 MED ORDER — CIPROFLOXACIN HCL 500 MG PO TABS: 500.0000 mg | ORAL_TABLET | Freq: Two times a day (BID) | ORAL | Status: DC

## 2019-07-02 MED ORDER — ASPIRIN 325 MG PO TABS: 325.0000 mg | ORAL_TABLET | Freq: Every day | ORAL | 1 refills | Status: DC

## 2019-07-02 MED ORDER — IBUPROFEN 400 MG PO TABS: 400.0000 mg | ORAL_TABLET | Freq: Two times a day (BID) | ORAL | 0 refills | Status: DC

## 2019-07-02 MED ORDER — POTASSIUM CHLORIDE CRYS ER 20 MEQ PO TBCR: 20.0000 meq | EXTENDED_RELEASE_TABLET | Freq: Every day | ORAL | 0 refills | Status: DC

## 2019-07-02 MED ORDER — CIPROFLOXACIN HCL 500 MG PO TABS: 500.0000 mg | ORAL_TABLET | Freq: Two times a day (BID) | ORAL | 0 refills | Status: AC

## 2019-07-02 MED ORDER — OXYCODONE-ACETAMINOPHEN 5-325 MG PO TABS: 1.0000 | ORAL_TABLET | Freq: Four times a day (QID) | ORAL | 0 refills | Status: DC | PRN

## 2019-07-02 MED ORDER — DOCUSATE SODIUM 100 MG PO CAPS: 100.0000 mg | ORAL_CAPSULE | Freq: Two times a day (BID) | ORAL | 0 refills | Status: DC | PRN

## 2019-07-02 MED ORDER — METOPROLOL TARTRATE 25 MG PO TABS: 25.0000 mg | ORAL_TABLET | Freq: Two times a day (BID) | ORAL | 1 refills | Status: DC

## 2019-07-02 NOTE — Progress Notes (Signed)
CSW confirmed with [facility] that patient should discharge with wet to dry dressing and they will put on her wound vac at facility, as [facility] wound vac's are not to leave the hospital. CSW relayed information to nurse and charge nurse.   [place], [place]

## 2019-07-02 NOTE — Progress Notes (Signed)
Patient was supposed to discharge to facility with wound vac intact; however, patient was discharged w/wet to dry dressing and facility notified that wound vac needed to be replaced once she arrived at the facility.  CVTS is aware that wound vac had to be removed for transport.  Interpreter utilized to communicate transport needs/process to patient and all questions answered per patient/interpreter.  Hand written RX for oxycodone given to driver.  Report called to facility, [facility], at [phone], and given to [place], [place].

## 2019-07-02 NOTE — TOC Transition Note (Addendum)
Transition of Care [facility]) - CM/SW Discharge Note   Patient Details  Name: Kimberly Bryant MRN: [id number] Date of Birth: [birth year]  Transition of Care [facility]) CM/SW Contact:  Alberteen Sam, [place] Phone Number: [phone] 07/02/2019, 12:48 PM   Clinical Narrative:     Patient will DC to: [place] Anticipated DC date: 07/02/2019 Family notified:[place] - lvm Transport XB:[facility]  Per MD patient ready for DC to [facility] . RN, patient, patient's family, and facility notified of DC. Discharge Summary sent to facility. RN given number for report [phone] Room 119 . DC packet on chart. Ambulance transport requested for patient.  CSW signing off.  [place], [place]   Final next level of care: Skilled Nursing Facility Barriers to Discharge: No Barriers Identified   Patient Goals and CMS Choice Patient states their goals for this hospitalization and ongoing recovery are:: to get better CMS Medicare.gov Compare Post Acute Care list provided to:: Patient Represenative (must comment)(Debany (daughter)) Choice offered to / list presented to : Adult Children(Beckie)  Discharge Placement PASRR number recieved: 06/26/19            Patient chooses bed at: Other - please specify in the comment section below:([facility]) Patient to be transferred to facility by: [place] Name of family member notified: Lelan Pons Patient and family notified of of transfer: 07/02/19  Discharge Plan and Services In-house Referral: NA Discharge Planning Services: CM Consult Post Acute Care Choice: [place]          DME Arranged: Negative pressure wound device DME Agency: [facility] Date DME Agency Contacted: 06/25/19 Time DME Agency Contacted: 1526 Representative spoke with at DME Agency: [place]: RN [place] Agency: [facility])     Representative spoke with at [place]: [place] ([place]) Interventions      Readmission Risk Interventions Readmission Risk Prevention Plan 06/16/2019  Transportation Screening Complete  PCP or Specialist Appt within 5-7 Days Complete  Home Care Screening Complete  Medication Review (RN CM) Complete

## 2019-07-02 NOTE — Progress Notes (Addendum)
      [address]       [place] [address]             [phone]      9 Days Post-Op Procedure(s) (LRB): STERNAL WOUND DEBRIDEMENT (N/A) APPLICATION OF WOUND VAC (N/A) Subjective: No issues. Ready for discharge today.   Objective: Vital signs in last 24 hours: Temp:  [97.8 F (36.6 C)-98.8 F (37.1 C)] 97.9 F (36.6 C) (08/27 0502) Pulse Rate:  [81-96] 85 (08/27 0502) Cardiac Rhythm: Normal sinus rhythm (08/27 0400) Resp:  [17-30] 19 (08/27 0502) BP: (117-139)/(60-89) 123/76 (08/27 0502) SpO2:  [96 %-100 %] 98 % (08/27 0502) Weight:  [86 kg] 86 kg (08/27 0502)     Intake/Output from previous day: 08/26 0701 - 08/27 0700 In: 483 [P.O.:477; I.V.:6] Out: 0  Intake/Output this shift: No intake/output data recorded.  General appearance: alert, cooperative and no distress Heart: regular rate and rhythm, S1, S2 normal, no murmur, click, rub or gallop Lungs: clear to auscultation bilaterally Abdomen: soft, non-tender; bowel sounds normal; no masses,  no organomegaly Extremities: extremities normal, atraumatic, no cyanosis or edema Wound: clean and dry. Wound vac with good suction  Lab Results: Recent Labs    07/02/19 0141  WBC 6.3  HGB 8.2*  HCT 27.6*  PLT 490*   BMET:  Recent Labs    07/02/19 0141  NA 142  K 3.8  CL 106  CO2 28  GLUCOSE 99  BUN 14  CREATININE 0.92  CALCIUM 8.7*    PT/INR: No results for input(s): LABPROT, INR in the last 72 hours. ABG    Component Value Date/Time   PHART 7.312 (L) 06/10/2019 1947   HCO3 23.1 06/10/2019 1947   TCO2 22 06/10/2019 2014   ACIDBASEDEF 3.0 (H) 06/10/2019 1947   O2SAT 97.0 06/10/2019 1947   CBG (last 3)  No results for input(s): GLUCAP in the last 72 hours.  Assessment/Plan: S/P Procedure(s) (LRB): STERNAL WOUND DEBRIDEMENT (N/A) APPLICATION OF WOUND VAC (N/A)  1. CV -SR in the 80's. On Lopressor 25 mg bid andAmlodipine 5 mg daily. 2. Pulmonary - On room air with good  oxygen saturation. 3. Volume Overload -On Lasix 40 mg daily 4.ID-on Ceftriaxone for Morganella Morgani 5. Wound Vac changes M/W/Fri. Once wound granulated further, will change dressings to wet-to-dry. 6.Bed available at SNF. Insurance Josem Kaufmann process has begun. Caryl Pina has been following and providing updates. Likely bed today.  Plan: Probably discharge to SNF today. Wound vac changes M,W,F. Her chest tube site was moist this morning. I left the stitch and dressed with a dry 4 x 4 gauze. She is to keep it dressed with dry gauze until we see her in the office in 1 week. She will need Cipro BID per pharmacy. Awaiting length of therapy recommendations for Morganella Morgani.    LOS: 11 days    Elgie Collard 07/02/2019

## 2019-07-03 ENCOUNTER — Ambulatory Visit: Payer: 59 | Admitting: Cardiothoracic Surgery

## 2019-07-07 ENCOUNTER — Telehealth: Payer: Self-pay | Admitting: *Deleted

## 2019-07-07 NOTE — Telephone Encounter (Signed)
Paperwork from [facility] was faxed 07-03-2019 with a confirmation sheet. Documents will be sent to scan center.

## 2019-07-08 NOTE — Progress Notes (Signed)
Cardiology Office Note    Date:  07/21/2019   ID:  Kimberly Bryant, DOB 03-Jul-1956, MRN [id number]  PCP:  Antony Blackbird, MD  Cardiologist: Fransico Him, MD EPS: None  No chief complaint on file.   History of Present Illness:  Kimberly Bryant is a 63 y.o. female with history of hypertension, HLD, angioedema on ACE inhibitor, GERD, CAD status post acute NSTEMI 05/2019 followed by CABG x4 with a LIMA to the LAD, SVG to OM, SVG to diagonal, SVG to distal LAD.  EF normal.  She came back to the hospital with sternal wound drainage and a left large pleural effusion.  She was taken to the OR for sternal debridement and wound VAC placement 06/23/2019 and also had a chest tube put in.  Patient comes in today for follow-up accompanied by her daughter who acts as her interpreter.  They went to the wrong office initially.  Patient's wound is being treated by the son-in-law.  She was in a nursing home initially but now is at home.  They have 2 bags of medicines and she does not want to take any of them.  I went through each of the medications and stressed the importance of why she needs to be on them in the correct dosages.  Denies chest pain shortness of breath or edema.  Has some chest soreness.   Past Medical History:  Diagnosis Date   CAD (coronary artery disease)    Hyperlipidemia    Hypertension    Pleural effusion 06/24/2019   LEFT    Past Surgical History:  Procedure Laterality Date   APPLICATION OF WOUND VAC N/A 06/23/2019   Procedure: APPLICATION OF WOUND VAC;  Surgeon: Wonda Olds, MD;  Location: [facility];  Service: Thoracic;  Laterality: N/A;   CORONARY ARTERY BYPASS GRAFT N/A 06/10/2019   Procedure: CORONARY ARTERY BYPASS GRAFTING (CABG) x Four, using left internal mammary artery and right leg greater saphenous vein harvested endoscopically;  Surgeon: Wonda Olds, MD;  Location: [facility];  Service: Open Heart Surgery;  Laterality: N/A;   LEFT HEART CATH AND CORONARY ANGIOGRAPHY  N/A 06/09/2019   Procedure: LEFT HEART CATH AND CORONARY ANGIOGRAPHY;  Surgeon: Troy Sine, MD;  Location: [facility];  Service: Cardiovascular;  Laterality: N/A;   STERNAL WOUND DEBRIDEMENT N/A 06/23/2019   Procedure: STERNAL WOUND DEBRIDEMENT;  Surgeon: Wonda Olds, MD;  Location: [facility];  Service: Thoracic;  Laterality: N/A;   TEE WITHOUT CARDIOVERSION N/A 06/10/2019   Procedure: TRANSESOPHAGEAL ECHOCARDIOGRAM (TEE);  Surgeon: Wonda Olds, MD;  Location: [facility];  Service: Open Heart Surgery;  Laterality: N/A;    Current Medications: Current Meds  Medication Sig   acetaminophen (TYLENOL) 325 MG tablet Take 2 tablets (650 mg total) by mouth every 6 (six) hours as needed for mild pain (or Fever >/= 101).   aspirin 325 MG tablet Take 1 tablet (325 mg total) by mouth daily.   atorvastatin (LIPITOR) 80 MG tablet Take 1 tablet (80 mg total) by mouth daily at 6 PM.   colchicine 0.6 MG tablet Take 1 tablet (0.6 mg total) by mouth daily.   docusate sodium (COLACE) 100 MG capsule Take 1 capsule (100 mg total) by mouth 2 (two) times daily as needed for mild constipation.   furosemide (LASIX) 40 MG tablet Take 1 tablet (40 mg total) by mouth daily.   ibuprofen (ADVIL) 400 MG tablet Take 1 tablet (400 mg total) by mouth 2 (two) times  daily.   lansoprazole (PREVACID) 30 MG capsule Take 1 capsule (30 mg total) by mouth 2 (two) times daily. To reduce stomach acid   metoprolol tartrate (LOPRESSOR) 25 MG tablet Take 1 tablet (25 mg total) by mouth 2 (two) times daily.   oxyCODONE-acetaminophen (PERCOCET/ROXICET) 5-325 MG tablet Take 1 tablet by mouth every 6 (six) hours as needed for moderate pain.   potassium chloride SA (K-DUR) 20 MEQ tablet Take 1 tablet (20 mEq total) by mouth daily.     Allergies:   Carafate [sucralfate] and Lisinopril   Social History   Socioeconomic History   Marital status: Married    Spouse name: Not on file   Number of children: Not on file     Years of education: Not on file   Highest education level: Not on file  Occupational History   Not on file  Social Needs   Financial resource strain: Not on file   Food insecurity    Worry: Not on file    Inability: Not on file   Transportation needs    Medical: Not on file    Non-medical: Not on file  Tobacco Use   Smoking status: Never Smoker   Smokeless tobacco: Never Used  Substance and Sexual Activity   Alcohol use: Never    Frequency: Never   Drug use: Never   Sexual activity: Not Currently  Lifestyle   Physical activity    Days per week: Not on file    Minutes per session: Not on file   Stress: Not on file  Relationships   Social connections    Talks on phone: Not on file    Gets together: Not on file    Attends religious service: Not on file    Active member of club or organization: Not on file    Attends meetings of clubs or organizations: Not on file    Relationship status: Not on file  Other Topics Concern   Not on file  Social History Narrative   Not on file     Family History:  The patient's   family history includes Hypertension in her mother.   ROS:   Please see the history of present illness.    ROS All other systems reviewed and are negative.   PHYSICAL EXAM:   VS:  BP 128/86    Pulse 86    Ht 5\' 3"  (1.6 m)    Wt 189 lb 6.4 oz (85.9 kg)    SpO2 98%    BMI 33.55 kg/m   Physical Exam  GEN: Well nourished, well developed, in no acute distress  Neck: no JVD, carotid bruits, or masses Cardiac: Small sternal wound opening that is clean and dry, RRR; no murmurs, rubs, or gallops  Respiratory: Decreased breath sounds at left lung base GI: soft, nontender, nondistended, + BS Ext: without cyanosis, clubbing, or edema, Good distal pulses bilaterally Neuro:  Alert and Oriented x 3 Psych: euthymic mood, full affect  Wt Readings from Last 3 Encounters:  07/21/19 189 lb 6.4 oz (85.9 kg)  07/02/19 189 lb 9.5 oz (86 kg)  06/16/19 202 lb  9.6 oz (91.9 kg)      Studies/Labs Reviewed:   EKG:  EKG is not ordered today.   Recent Labs: 06/08/2019: ALT 22 06/23/2019: Magnesium 2.1 07/02/2019: BUN 14; Creatinine, Ser 0.92; Hemoglobin 8.2; Platelets 490; Potassium 3.8; Sodium 142   Lipid Panel    Component Value Date/Time   CHOL 177 12/10/2018 1117   TRIG  68 12/10/2018 1117   HDL 54 12/10/2018 1117   CHOLHDL 3.3 12/10/2018 1117   LDLCALC 109 (H) 12/10/2018 1117    Additional studies/ records that were reviewed today include:  Intra-Op TEE 8/5/2020SUMMARY   - No dissection noted after cannula removed. - CO > 2.5, CI > 1.5 - Volume responsive  PRE-OP FINDINGS  Left Ventricle: The left ventricle has hyperdynamic systolic function, with an ejection fraction of >65%. The cavity size was normal. There is severe concentric left ventricular hypertrophy. No evidence of left ventricular regional wall motion  abnormalities.   Right Ventricle: The right ventricle has normal systolic function. The cavity was normal. There is no increase in right ventricular wall thickness. Right ventricular systolic pressure is normal.   Left Atrium: Left atrial size was dilated. The left atrial appendage is well visualized and there is no evidence of thrombus present. Left atrial appendage velocity is normal at greater than 40 cm/s.   Right Atrium: Right atrial size was dilated. Right atrial pressure is estimated at 10 mmHg.   Interatrial Septum: No atrial level shunt detected by color flow Doppler.   Pericardium: A small pericardial effusion is present. The pericardial effusion is anterior to the right ventricle and surrounding the apex. There is no evidence of cardiac tamponade. There is no pleural effusion.   Mitral Valve: The mitral valve is normal in structure. Mild thickening of the mitral valve leaflet. No calcification of the mitral valve leaflet. Mitral valve regurgitation is mild to moderate by color flow Doppler. The MR jet is  centrally-directed.  There is no evidence of mitral valve vegetation.   Tricuspid Valve: The tricuspid valve was normal in structure. Tricuspid valve regurgitation is trivial by color flow Doppler. The tricuspid valve is mildly thickened. No TV vegetation was visualized.   Aortic Valve: The aortic valve is normal in structure. Aortic valve regurgitation was not visualized by color flow Doppler. There is no evidence of aortic valve stenosis. There is no evidence of a vegetation on the aortic valve.   Pulmonic Valve: The pulmonic valve was normal in structure No evidence of pumonic stenosis. Pulmonic valve regurgitation is not visualized by color flow Doppler.     Aorta: The aortic root and aortic arch are normal in size and structure. There is dilatation of the aortic root.   Pulmonary Artery: The pulmonary artery is of normal size and origin.     Shona SimpsonKevin Hollis MD Electronically signed by Shona SimpsonKevin Hollis MD Signature Date/Time: 06/10/2019/1:31:13 PM     Cardiac catheterization 06/09/2019  2nd Mrg lesion is 100% stenosed.  Prox Cx to Mid Cx lesion is 90% stenosed.  Prox LAD lesion is 60% stenosed.  Prox LAD to Mid LAD lesion is 95% stenosed.  Mid LAD lesion is 90% stenosed.  Dist LAD lesion is 90% stenosed.  Prox RCA to Mid RCA lesion is 100% stenosed.  Mid RCA to Dist RCA lesion is 100% stenosed.   Severe three-vessel coronary obstructive disease with 60 and 95% proximal LAD stenoses, 90% mid and diffuse 90% distal LAD stenoses; total recent occlusion of the second circumflex marginal vessel of the circumflex with contrast hang-up and 90% stenosis in the AV groove circumflex just after this marginal takeoff; and total mid RCA occlusion with possible small bridging collaterals with total distal RCA occlusion with collateralization distally via the proximal marginal branch and left circulation.   LVEDP 16 mm.  Left ventriculography was not done, but the echo report which came back  during the  procedure showed EF greater than 65%.   RECOMMENDATION: Surgical consultation for CABG revascularization surgery.       2D echo 8/4/2020IMPRESSIONS      1. The left ventricle has hyperdynamic systolic function, with an ejection fraction of >65%. The cavity size was normal. There is severely increased left ventricular wall thickness. Left ventricular diastolic Doppler parameters are consistent with  pseudonormalization. Elevated mean left atrial pressure No evidence of left ventricular regional wall motion abnormalities.  2. The right ventricle has normal systolic function. The cavity was normal. There is no increase in right ventricular wall thickness. Right ventricular systolic pressure is mildly elevated with an estimated pressure of 32.1 mmHg.  3. Left atrial size was moderately dilated.  4. Right atrial size was mildly dilated.  5. Mild thickening of the mitral valve leaflet. Mitral valve regurgitation is moderate by color flow Doppler.  6. The aorta is normal in size and structure.  7. The aortic root is normal in size and structure.  8. The inferior vena cava was dilated in size with <50% respiratory variability.     ASSESSMENT:    1. Coronary artery disease involving native coronary artery of native heart without angina pectoris   2. S/P CABG x 4   3. Pleural effusion on left   4. Essential hypertension   5. Hyperlipidemia, unspecified hyperlipidemia type      PLAN:  In order of problems listed above:  CAD status post end STEMI followed by CABG times 48/5/20 normal LVEF preop.  Readmitted with wound drainage and underwent debridement of sternal wound as well as left pleural effusion treated with chest tube-patient now home from the nursing home.  There is a lot of confusion over her medications and she does not want to take many of them.  I went through in detail the importance of each of the medicines.  Will have home health go out and help manage her  medications.  She was not taking amlodipine.  Restart amlodipine 5 mg once daily.  Sternal wound opening without proper education to the family after discharge from nursing facility.  Open wound clean and dry.  To see the surgeons on Friday.  Essential hypertension blood pressure controlled  Hyperlipidemia on Lipitor 80 mg daily    Medication Adjustments/Labs and Tests Ordered: Current medicines are reviewed at length with the patient today.  Concerns regarding medicines are outlined above.  Medication changes, Labs and Tests ordered today are listed in the Patient Instructions below. Patient Instructions  Medication Instructions:  START: amlodipine 5 mg tablet: Take 1 tablet by mouth once a day  Lab work: TODAY: CBC, BMET  If you have labs (blood work) drawn today and your tests are completely normal, you will receive your results only by:  MyChart Message (if you have MyChart) OR  A paper copy in the mail If you have any lab test that is abnormal or we need to change your treatment, we will call you to review the results.  Testing/Procedures: None ordered  Follow-Up:  Follow up with Dr. Mayford Knife on 08/24/19 at 9:40 AM  Any Other Special Instructions Will Be Listed Below (If Applicable).  We will arrange for home health to come out and help with your medicines     Signed, Jacolyn Reedy, PA-C  07/21/2019 10:26 AM    [facility] [address], [place], [place]  [address] Phone: [phone]; Fax: [phone]

## 2019-07-10 ENCOUNTER — Encounter: Payer: Self-pay | Admitting: Cardiothoracic Surgery

## 2019-07-10 ENCOUNTER — Ambulatory Visit (INDEPENDENT_AMBULATORY_CARE_PROVIDER_SITE_OTHER): Payer: Self-pay | Admitting: Cardiothoracic Surgery

## 2019-07-10 ENCOUNTER — Ambulatory Visit
Admission: RE | Admit: 2019-07-10 | Discharge: 2019-07-10 | Disposition: A | Discharge status: Home / Self Care | Payer: 59 | Source: Ambulatory Visit | Attending: Cardiothoracic Surgery | Admitting: Cardiothoracic Surgery

## 2019-07-10 ENCOUNTER — Other Ambulatory Visit: Payer: Self-pay

## 2019-07-10 ENCOUNTER — Other Ambulatory Visit: Payer: 59

## 2019-07-10 ENCOUNTER — Other Ambulatory Visit: Payer: Self-pay | Admitting: Cardiothoracic Surgery

## 2019-07-10 VITALS — BP 135/84 | HR 84 | Temp 97.9°F | Resp 16 | Ht 63.0 in

## 2019-07-10 DIAGNOSIS — S21109A Unspecified open wound of unspecified front wall of thorax without penetration into thoracic cavity, initial encounter: Secondary | ICD-10-CM

## 2019-07-10 DIAGNOSIS — Z951 Presence of aortocoronary bypass graft: Secondary | ICD-10-CM

## 2019-07-10 DIAGNOSIS — I2510 Atherosclerotic heart disease of native coronary artery without angina pectoris: Secondary | ICD-10-CM

## 2019-07-20 NOTE — Progress Notes (Signed)
[address]       [place], [id number]             [phone]     CARDIOTHORACIC SURGERY OFFICE NOTE  Referring Provider is Quintella Reicherturner, Traci R, MD Primary Cardiologist is Armanda Magicraci Turner, MD PCP is Cain SaupeFulp, Cammie, MD   HPI:  63 yo lady s/p CABG complicated by readmission for left pleural effusion and sternal defect requiring wound vac therapy. She was recently discharged to SNF. Presents today for wound check. No complaint, feeling better. (Interview is performed with interpreter on the telephone)   Current Outpatient Medications  Medication Sig Dispense Refill  . acetaminophen (TYLENOL) 325 MG tablet Take 2 tablets (650 mg total) by mouth every 6 (six) hours as needed for mild pain (or Fever >/= 101). 60 tablet 1  . amLODipine (NORVASC) 5 MG tablet Take 1 tablet (5 mg total) by mouth daily. 30 tablet 1  . aspirin 325 MG tablet Take 1 tablet (325 mg total) by mouth daily. 30 tablet 1  . atorvastatin (LIPITOR) 80 MG tablet Take 1 tablet (80 mg total) by mouth daily at 6 PM. 30 tablet 1  . colchicine 0.6 MG tablet Take 1 tablet (0.6 mg total) by mouth daily. 30 tablet 1  . docusate sodium (COLACE) 100 MG capsule Take 1 capsule (100 mg total) by mouth 2 (two) times daily as needed for mild constipation. 10 capsule 0  . furosemide (LASIX) 40 MG tablet Take 1 tablet (40 mg total) by mouth daily. 7 tablet 0  . ibuprofen (ADVIL) 400 MG tablet Take 1 tablet (400 mg total) by mouth 2 (two) times daily. 30 tablet 0  . lansoprazole (PREVACID) 30 MG capsule Take 1 capsule (30 mg total) by mouth 2 (two) times daily. To reduce stomach acid 60 capsule 2  . metoprolol tartrate (LOPRESSOR) 25 MG tablet Take 1 tablet (25 mg total) by mouth 2 (two) times daily. 60 tablet 1  . oxyCODONE-acetaminophen (PERCOCET/ROXICET) 5-325 MG tablet Take 1 tablet by mouth every 6 (six) hours as needed for moderate pain. 30 tablet 0  . potassium chloride SA (K-DUR) 20 MEQ tablet Take 1 tablet (20 mEq  total) by mouth daily. 7 tablet 0   No current facility-administered medications for this visit.       Physical Exam:   BP 135/84 (BP Location: Left Arm, Patient Position: Sitting, Cuff Size: Normal)   Pulse 84   Temp 97.9 F (36.6 C)   Resp 16   Ht 5\' 3"  (1.6 m)   SpO2 95% Comment: RA  BMI 33.59 kg/m   General:  Well-appearing, NAD  Chest:   cta  CV:   rrr  Incisions:  Good granulation, now very superficial   Extremities:  No edema  Diagnostic Tests:  n/a   Impression:  Improving overall  Plan: Switch to we-to-dry dressings at sternal defect BID Ok to discharge from SNF  I spent in excess of 15 minutes during the conduct of this office consultation and >50% of this time involved direct face-to-face encounter with the patient for counseling and/or coordination of their care.  Level 2                 10 minutes Level 3                 15 minutes Level 4                 25 minutes Level  5                 40 minutes  B. Murvin Natal, MD 07/20/2019 11:10 PM

## 2019-07-21 ENCOUNTER — Other Ambulatory Visit: Payer: Self-pay

## 2019-07-21 ENCOUNTER — Telehealth: Payer: Self-pay

## 2019-07-21 ENCOUNTER — Ambulatory Visit (INDEPENDENT_AMBULATORY_CARE_PROVIDER_SITE_OTHER): Payer: 59 | Admitting: Physician Assistant

## 2019-07-21 ENCOUNTER — Encounter: Payer: Self-pay | Admitting: Physician Assistant

## 2019-07-21 VITALS — BP 128/86 | HR 86 | Ht 63.0 in | Wt 189.4 lb

## 2019-07-21 DIAGNOSIS — J90 Pleural effusion, not elsewhere classified: Secondary | ICD-10-CM

## 2019-07-21 DIAGNOSIS — Z951 Presence of aortocoronary bypass graft: Secondary | ICD-10-CM

## 2019-07-21 DIAGNOSIS — I10 Essential (primary) hypertension: Secondary | ICD-10-CM | POA: Diagnosis not present

## 2019-07-21 DIAGNOSIS — E785 Hyperlipidemia, unspecified: Secondary | ICD-10-CM

## 2019-07-21 DIAGNOSIS — I2510 Atherosclerotic heart disease of native coronary artery without angina pectoris: Secondary | ICD-10-CM | POA: Diagnosis not present

## 2019-07-21 LAB — BASIC METABOLIC PANEL
BUN/Creatinine Ratio: 12 (ref 12–28)
BUN: 14 mg/dL (ref 8–27)
CO2: 24 mmol/L (ref 20–29)
Calcium: 9.2 mg/dL (ref 8.7–10.3)
Chloride: 104 mmol/L (ref 96–106)
Creatinine, Ser: 1.19 mg/dL — ABNORMAL HIGH (ref 0.57–1.00)
GFR calc Af Amer: 57 mL/min/{1.73_m2} — ABNORMAL LOW (ref >59)
GFR calc non Af Amer: 49 mL/min/{1.73_m2} — ABNORMAL LOW (ref >59)
Glucose: 85 mg/dL (ref 65–99)
Potassium: 4.8 mmol/L (ref 3.5–5.2)
Sodium: 141 mmol/L (ref 134–144)

## 2019-07-21 LAB — CBC
Hematocrit: 31.7 % — ABNORMAL LOW (ref 34.0–46.6)
Hemoglobin: 9.2 g/dL — ABNORMAL LOW (ref 11.1–15.9)
MCH: 24.3 pg — ABNORMAL LOW (ref 26.6–33.0)
MCHC: 29 g/dL — ABNORMAL LOW (ref 31.5–35.7)
MCV: 84 fL (ref 79–97)
Platelets: 439 10*3/uL (ref 150–450)
RBC: 3.78 x10E6/uL (ref 3.77–5.28)
RDW: 14.1 % (ref 11.7–15.4)
WBC: 5.6 10*3/uL (ref 3.4–10.8)

## 2019-07-21 MED ORDER — AMLODIPINE BESYLATE 5 MG PO TABS: 5.0000 mg | ORAL_TABLET | Freq: Every day | ORAL | 3 refills | Status: DC

## 2019-07-21 NOTE — Telephone Encounter (Signed)
[facility] Visit Initial Request  Date of Request ([place]):  July 21, 2019  Requesting Provider:  Ermalinda Barrios, PA    Agency Requested:    [facility] Contact:  Kimberly Buff, NP [address] [place], [place] [address] Phone #:  [phone] Fax #:  [phone]  Patient Demographic Information: Name:  Kimberly Bryant Age:  63 y.o.   DOB:  August 29, 1956  MRN:  [id number]   Address:   [address] [place] [address]   Phone Numbers:   Home Phone [phone]  Mobile [phone]     Emergency Contact Information on File:   Contact Information    Name Relation Home Work Mobile   Kimberly Bryant Daughter   [phone]   Kimberly Bryant Other   [phone]      The above family members may be contacted for information on this patient (review DPR on file):  Yes    Patient Clinical Information:  Primary Care Provider:  Antony Blackbird, MD  Primary Cardiologist:  Kimberly Him, MD  Primary Electrophysiologist:  None   Past Medical Hx: [name]  has a past medical history of CAD (coronary artery disease), Hyperlipidemia, Hypertension, and Pleural effusion (06/24/2019).   Allergies: She is allergic to carafate [sucralfate] and lisinopril.   Medications: Current Outpatient Medications on File Prior to Visit  Medication Sig  . acetaminophen (TYLENOL) 325 MG tablet Take 2 tablets (650 mg total) by mouth every 6 (six) hours as needed for mild pain (or Fever >/= 101).  Kimberly Bryant Kitchen amLODipine (NORVASC) 5 MG tablet Take 1 tablet (5 mg total) by mouth daily.  Kimberly Bryant Kitchen aspirin 325 MG tablet Take 1 tablet (325 mg total) by mouth daily.  Kimberly Bryant Kitchen atorvastatin (LIPITOR) 80 MG tablet Take 1 tablet (80 mg total) by mouth daily at 6 PM.  . colchicine 0.6 MG tablet Take 1 tablet (0.6 mg total) by mouth daily.  Kimberly Bryant Kitchen docusate sodium (COLACE) 100 MG capsule Take 1 capsule (100 mg total) by mouth 2 (two) times daily as needed for mild constipation.  . furosemide  (LASIX) 40 MG tablet Take 1 tablet (40 mg total) by mouth daily.  Kimberly Bryant Kitchen ibuprofen (ADVIL) 400 MG tablet Take 1 tablet (400 mg total) by mouth 2 (two) times daily.  . lansoprazole (PREVACID) 30 MG capsule Take 1 capsule (30 mg total) by mouth 2 (two) times daily. To reduce stomach acid  . metoprolol tartrate (LOPRESSOR) 25 MG tablet Take 1 tablet (25 mg total) by mouth 2 (two) times daily.  Kimberly Bryant Kitchen oxyCODONE-acetaminophen (PERCOCET/ROXICET) 5-325 MG tablet Take 1 tablet by mouth every 6 (six) hours as needed for moderate pain.  . potassium chloride SA (K-DUR) 20 MEQ tablet Take 1 tablet (20 mEq total) by mouth daily.  . [DISCONTINUED] lisinopril (ZESTRIL) 5 MG tablet Take 1 tablet (5 mg total) by mouth daily.   No current facility-administered medications on file prior to visit.      Social Hx: She  reports that she has never smoked. She has never used smokeless tobacco. She reports that she does not drink alcohol or use drugs.    Diagnosis/Reason for Visit:   CAD, s/p CABG x 4, Pleural effusion, HTN, HLD  Services Requested:  Vital Signs (BP, Pulse, O2, Weight)  Physical Exam  Medication Reconciliation  # of Visits Needed/Frequency per Week: Once

## 2019-07-21 NOTE — Patient Instructions (Signed)
Medication Instructions:  START: amlodipine 5 mg tablet: Take 1 tablet by mouth once a day  Lab work: TODAY: CBC, BMET  If you have labs (blood work) drawn today and your tests are completely normal, you will receive your results only by: Marland Kitchen MyChart Message (if you have MyChart) OR . A paper copy in the mail If you have any lab test that is abnormal or we need to change your treatment, we will call you to review the results.  Testing/Procedures: None ordered  Follow-Up: . Follow up with Dr. Radford Pax on 08/24/19 at 9:40 AM  Any Other Special Instructions Will Be Listed Below (If Applicable).  We will arrange for home health to come out and help with your medicines

## 2019-07-22 ENCOUNTER — Telehealth: Payer: Self-pay | Admitting: Cardiology

## 2019-07-22 DIAGNOSIS — I2510 Atherosclerotic heart disease of native coronary artery without angina pectoris: Secondary | ICD-10-CM

## 2019-07-22 DIAGNOSIS — I10 Essential (primary) hypertension: Secondary | ICD-10-CM

## 2019-07-22 NOTE — Telephone Encounter (Signed)
-----   Message from Imogene Burn, PA-C sent at 07/22/2019  8:52 AM EDT ----- Kidney function up a little. Please discuss lasix and potassium need with surgeon on Friday. hbg better. No changes repeat bmet in 2 weeks

## 2019-07-22 NOTE — Telephone Encounter (Signed)
New Message ° ° ° °Patient returning your call about lab results  °

## 2019-07-22 NOTE — Telephone Encounter (Signed)
Called and spoke to daughter and made her aware of results and recommendations. Lab appt made for 10/5. She states that she cannot come before then.

## 2019-07-22 NOTE — Addendum Note (Signed)
Addended by: Drue Novel I on: 07/22/2019 04:33 PM   Modules accepted: Orders

## 2019-07-23 ENCOUNTER — Other Ambulatory Visit: Payer: Self-pay | Admitting: Cardiothoracic Surgery

## 2019-07-23 DIAGNOSIS — Z951 Presence of aortocoronary bypass graft: Secondary | ICD-10-CM

## 2019-07-24 ENCOUNTER — Encounter: Payer: Self-pay | Admitting: Cardiothoracic Surgery

## 2019-07-24 ENCOUNTER — Ambulatory Visit
Admission: RE | Admit: 2019-07-24 | Discharge: 2019-07-24 | Disposition: A | Discharge status: Home / Self Care | Payer: 59 | Source: Ambulatory Visit | Attending: Cardiothoracic Surgery | Admitting: Cardiothoracic Surgery

## 2019-07-24 DIAGNOSIS — Z951 Presence of aortocoronary bypass graft: Secondary | ICD-10-CM

## 2019-07-29 ENCOUNTER — Other Ambulatory Visit: Payer: Self-pay | Admitting: Cardiothoracic Surgery

## 2019-07-29 DIAGNOSIS — Z951 Presence of aortocoronary bypass graft: Secondary | ICD-10-CM

## 2019-07-31 ENCOUNTER — Encounter: Payer: Self-pay | Admitting: Cardiothoracic Surgery

## 2019-08-07 ENCOUNTER — Ambulatory Visit
Admission: RE | Admit: 2019-08-07 | Discharge: 2019-08-07 | Disposition: A | Discharge status: Home / Self Care | Payer: 59 | Source: Ambulatory Visit | Attending: Cardiothoracic Surgery | Admitting: Cardiothoracic Surgery

## 2019-08-07 ENCOUNTER — Encounter: Payer: Self-pay | Admitting: Cardiothoracic Surgery

## 2019-08-07 ENCOUNTER — Telehealth: Payer: Self-pay | Admitting: Cardiothoracic Surgery

## 2019-08-07 DIAGNOSIS — Z951 Presence of aortocoronary bypass graft: Secondary | ICD-10-CM

## 2019-08-10 ENCOUNTER — Other Ambulatory Visit: Payer: 59

## 2019-08-17 ENCOUNTER — Ambulatory Visit: Payer: 59 | Admitting: Cardiology

## 2019-08-20 ENCOUNTER — Other Ambulatory Visit: Payer: Self-pay | Admitting: Cardiothoracic Surgery

## 2019-08-20 DIAGNOSIS — Z951 Presence of aortocoronary bypass graft: Secondary | ICD-10-CM

## 2019-08-21 ENCOUNTER — Encounter: Payer: Self-pay | Admitting: Cardiothoracic Surgery

## 2019-08-21 ENCOUNTER — Ambulatory Visit (INDEPENDENT_AMBULATORY_CARE_PROVIDER_SITE_OTHER): Payer: Self-pay | Admitting: Cardiothoracic Surgery

## 2019-08-21 ENCOUNTER — Other Ambulatory Visit: Payer: Self-pay

## 2019-08-21 ENCOUNTER — Ambulatory Visit
Admission: RE | Admit: 2019-08-21 | Discharge: 2019-08-21 | Disposition: A | Discharge status: Home / Self Care | Payer: 59 | Source: Ambulatory Visit | Attending: Cardiothoracic Surgery | Admitting: Cardiothoracic Surgery

## 2019-08-21 ENCOUNTER — Telehealth: Payer: Self-pay | Admitting: Cardiothoracic Surgery

## 2019-08-21 VITALS — BP 137/87 | HR 74 | Temp 97.5°F | Resp 20 | Wt 201.4 lb

## 2019-08-21 DIAGNOSIS — Z951 Presence of aortocoronary bypass graft: Secondary | ICD-10-CM

## 2019-08-22 NOTE — Progress Notes (Signed)
[address]       [place] [address]             [phone]     CARDIOTHORACIC SURGERY OFFICE NOTE  Referring Provider is Troy Sine, MD Primary Cardiologist is Fransico Him, MD PCP is Antony Blackbird, MD   HPI:  63 year old lady presents for another follow-up appointment status post coronary artery bypass grafting on 06/10/2019.  She did well except for an inflammatory response which led to retained left pleural effusion and pericardial effusion.  She was treated with a wound VAC for a few days in a rehab center to treat a lower incisional dehiscence.  She presents today for clearance to return to work.  She denies chest pain or shortness of breath.  Patient is accompanied by her daughter who corroborates these reports  Current Outpatient Medications  Medication Sig Dispense Refill  . acetaminophen (TYLENOL) 325 MG tablet Take 2 tablets (650 mg total) by mouth every 6 (six) hours as needed for mild pain (or Fever >/= 101). 60 tablet 1  . amLODipine (NORVASC) 5 MG tablet Take 1 tablet (5 mg total) by mouth daily. 90 tablet 3  . aspirin 325 MG tablet Take 1 tablet (325 mg total) by mouth daily. 30 tablet 1  . atorvastatin (LIPITOR) 80 MG tablet Take 1 tablet (80 mg total) by mouth daily at 6 PM. 30 tablet 1  . colchicine 0.6 MG tablet Take 1 tablet (0.6 mg total) by mouth daily. 30 tablet 1  . docusate sodium (COLACE) 100 MG capsule Take 1 capsule (100 mg total) by mouth 2 (two) times daily as needed for mild constipation. 10 capsule 0  . furosemide (LASIX) 40 MG tablet Take 1 tablet (40 mg total) by mouth daily. 7 tablet 0  . ibuprofen (ADVIL) 400 MG tablet Take 1 tablet (400 mg total) by mouth 2 (two) times daily. 30 tablet 0  . lansoprazole (PREVACID) 30 MG capsule Take 1 capsule (30 mg total) by mouth 2 (two) times daily. To reduce stomach acid 60 capsule 2  . metoprolol tartrate (LOPRESSOR) 25 MG tablet Take 1 tablet (25 mg total) by mouth 2 (two) times  daily. 60 tablet 1  . oxyCODONE-acetaminophen (PERCOCET/ROXICET) 5-325 MG tablet Take 1 tablet by mouth every 6 (six) hours as needed for moderate pain. 30 tablet 0  . potassium chloride SA (K-DUR) 20 MEQ tablet Take 1 tablet (20 mEq total) by mouth daily. 7 tablet 0   No current facility-administered medications for this visit.       Physical Exam:   BP 137/87 (BP Location: Right Arm)   Pulse 74   Temp (!) 97.5 F (36.4 C) (Skin)   Resp 20   Wt 91.4 kg   SpO2 98% Comment: RA  BMI 35.68 kg/m   General:  No acute distress  Chest:   Clear to auscultation  CV:   Regular rate and rhythm  Incisions:  Well-healed including the inferior aspect of the sternal incision  Abdomen:  Soft  Extremities:  No edema  Diagnostic Tests:  Chest x-ray showing clear lung fields and cardiomegaly   Impression:  Doing well after CABG.  No obstacles to return to work  Plan:  Follow-up as needed.  A note is given for work to return on 08/24/2019  I spent in excess of 15 minutes during the conduct of this office consultation and >50% of this time involved direct face-to-face encounter with the patient for  counseling and/or coordination of their care.  Level 2                 10 minutes Level 3                 15 minutes Level 4                 25 minutes Level 5                 40 minutes  B. Lorayne Marek, MD 08/22/2019 11:05 AM

## 2019-08-23 NOTE — Progress Notes (Signed)
Cardiology Office Note:    Date:  08/24/2019   ID:  ERIANNA JOLLY, DOB 1956/02/21, MRN [id number]  PCP:  Antony Blackbird, MD  Cardiologist:  Fransico Him, MD    Referring MD: Antony Blackbird, MD   Chief Complaint  Patient presents with  . Coronary Artery Disease  . Hypertension  . Hyperlipidemia    History of Present Illness:    Kimberly Bryant is a 63 y.o. female with a hx of hypertension, HLD, angioedema on ACE inhibitor, GERD, CAD status post acute NSTEMI 05/2019 followed by CABG x4 with a LIMA to the LAD, SVG to OM, SVG to diagonal, SVG to distal LAD.  EF normal.  She came back to the hospital with sternal wound drainage and a left large pleural effusion.  She was taken to the OR for sternal debridement and wound VAC placement 06/23/2019 and also had a chest tube put in.  She was seen back 07/21/2019 and was doing ok although was having some problems with her not wanting to take he meds.  Her meds were adjusted and condensed.    SHe is here today for followup and is doing well.  She is with her daughter today who serves as her interpreter.  She denies any chest pain or pressure (except for chest wall pain from surgery), SOB, DOE, PND, orthopnea, LE edema, dizziness, palpitations or syncope. She is compliant with her meds and is tolerating meds with no SE but her daughter is asking for her to switch what meds are possible to switch to liquid as she is having some problems with swallowing all the pills.   Past Medical History:  Diagnosis Date  . CAD (coronary artery disease)   . Hyperlipidemia   . Hypertension   . Pleural effusion 06/24/2019   LEFT    Past Surgical History:  Procedure Laterality Date  . APPLICATION OF WOUND VAC N/A 06/23/2019   Procedure: APPLICATION OF WOUND VAC;  Surgeon: Wonda Olds, MD;  Location: [facility];  Service: Thoracic;  Laterality: N/A;  . CORONARY ARTERY BYPASS GRAFT N/A 06/10/2019   Procedure: CORONARY ARTERY BYPASS GRAFTING (CABG) x Four, using left  internal mammary artery and right leg greater saphenous vein harvested endoscopically;  Surgeon: Wonda Olds, MD;  Location: [facility];  Service: Open Heart Surgery;  Laterality: N/A;  . LEFT HEART CATH AND CORONARY ANGIOGRAPHY N/A 06/09/2019   Procedure: LEFT HEART CATH AND CORONARY ANGIOGRAPHY;  Surgeon: Troy Sine, MD;  Location: [facility];  Service: Cardiovascular;  Laterality: N/A;  . STERNAL WOUND DEBRIDEMENT N/A 06/23/2019   Procedure: STERNAL WOUND DEBRIDEMENT;  Surgeon: Wonda Olds, MD;  Location: [facility];  Service: Thoracic;  Laterality: N/A;  . TEE WITHOUT CARDIOVERSION N/A 06/10/2019   Procedure: TRANSESOPHAGEAL ECHOCARDIOGRAM (TEE);  Surgeon: Wonda Olds, MD;  Location: [facility];  Service: Open Heart Surgery;  Laterality: N/A;    Current Medications: Current Meds  Medication Sig  . acetaminophen (TYLENOL) 325 MG tablet Take 2 tablets (650 mg total) by mouth every 6 (six) hours as needed for mild pain (or Fever >/= 101).  Marland Kitchen amLODipine (NORVASC) 5 MG tablet Take 1 tablet (5 mg total) by mouth daily.  Marland Kitchen atorvastatin (LIPITOR) 80 MG tablet Take 1 tablet (80 mg total) by mouth daily at 6 PM.  . docusate sodium (COLACE) 100 MG capsule Take 1 capsule (100 mg total) by mouth 2 (two) times daily as needed for mild constipation.  Marland Kitchen ibuprofen (ADVIL) 400  MG tablet Take 1 tablet (400 mg total) by mouth 2 (two) times daily.  . lansoprazole (PREVACID) 30 MG capsule Take 1 capsule (30 mg total) by mouth 2 (two) times daily. To reduce stomach acid  . oxyCODONE-acetaminophen (PERCOCET/ROXICET) 5-325 MG tablet Take 1 tablet by mouth every 6 (six) hours as needed for moderate pain.  . potassium chloride SA (K-DUR) 20 MEQ tablet Take 1 tablet (20 mEq total) by mouth daily.  . [DISCONTINUED] aspirin 325 MG tablet Take 1 tablet (325 mg total) by mouth daily.  . [DISCONTINUED] colchicine 0.6 MG tablet Take 1 tablet (0.6 mg total) by mouth daily.  . [DISCONTINUED] furosemide (LASIX) 40  MG tablet Take 1 tablet (40 mg total) by mouth daily.  . [DISCONTINUED] metoprolol tartrate (LOPRESSOR) 25 MG tablet Take 1 tablet (25 mg total) by mouth 2 (two) times daily.     Allergies:   Carafate [sucralfate] and Lisinopril   Social History   Socioeconomic History  . Marital status: Married    Spouse name: Not on file  . Number of children: Not on file  . Years of education: Not on file  . Highest education level: Not on file  Occupational History  . Not on file  Social Needs  . Financial resource strain: Not on file  . Food insecurity    Worry: Not on file    Inability: Not on file  . Transportation needs    Medical: Not on file    Non-medical: Not on file  Tobacco Use  . Smoking status: Never Smoker  . Smokeless tobacco: Never Used  Substance and Sexual Activity  . Alcohol use: Never    Frequency: Never  . Drug use: Never  . Sexual activity: Not Currently  Lifestyle  . Physical activity    Days per week: Not on file    Minutes per session: Not on file  . Stress: Not on file  Relationships  . Social Musicianconnections    Talks on phone: Not on file    Gets together: Not on file    Attends religious service: Not on file    Active member of club or organization: Not on file    Attends meetings of clubs or organizations: Not on file    Relationship status: Not on file  Other Topics Concern  . Not on file  Social History Narrative  . Not on file     Family History: The patient's family history includes Hypertension in her mother.  ROS:   Please see the history of present illness.    ROS  All other systems reviewed and negative.   EKGs/Labs/Other Studies Reviewed:    The following studies were reviewed today: none  EKG:  EKG is not ordered today.  Recent Labs: 06/08/2019: ALT 22 06/23/2019: Magnesium 2.1 07/21/2019: BUN 14; Creatinine, Ser 1.19; Hemoglobin 9.2; Platelets 439; Potassium 4.8; Sodium 141   Recent Lipid Panel    Component Value Date/Time    CHOL 177 12/10/2018 1117   TRIG 68 12/10/2018 1117   HDL 54 12/10/2018 1117   CHOLHDL 3.3 12/10/2018 1117   LDLCALC 109 (H) 12/10/2018 1117    Physical Exam:    VS:  BP 110/62   Pulse 73   Ht 5\' 3"  (1.6 m)   Wt 200 lb (90.7 kg)   SpO2 97%   BMI 35.43 kg/m     Wt Readings from Last 3 Encounters:  08/24/19 200 lb (90.7 kg)  08/21/19 201 lb 6.4 oz (91.4 kg)  07/21/19 189 lb 6.4 oz (85.9 kg)     GEN:  Well nourished, well developed in no acute distress HEENT: Normal NECK: No JVD; No carotid bruits LYMPHATICS: No lymphadenopathy CARDIAC: RRR, no murmurs, rubs, gallops RESPIRATORY:  Clear to auscultation without rales, wheezing or rhonchi  ABDOMEN: Soft, non-tender, non-distended MUSCULOSKELETAL:  No edema; No deformity  SKIN: Warm and dry NEUROLOGIC:  Alert and oriented x 3 PSYCHIATRIC:  Normal affect   ASSESSMENT:    1. Coronary artery disease involving native coronary artery of native heart without angina pectoris   2. Essential hypertension   3. Hyperlipidemia, unspecified hyperlipidemia type    PLAN:    In order of problems listed above:  1.  ASCAD - s/p NSTEMI 05/2019 followed by CABG x4 with a LIMA to the LAD, SVG to OM, SVG to diagonal, SVG to distal LAD.  -she denies any angina sx -continue ASA and decrease to 81mg  daily as she is more than 3 months out from her CABG -continue statin and BB.  2.  HTN -BP controlled on exam -continue amlodipine 5mg  daily, Lopressor 25mg  BID  3.  HLD -LDL goal < 70 -LDL 109 in Feb 2020.  -repeat FLP and ALT -continue atorvastatin 80mg  daily  4.  Chronic diastolic CHF -continue on Lasix but decrease to 20mg  daily -decrease K+ supp to 10 meq daily and change to effervescent formulation due to difficulty swallowing Kur -check BMET today   Medication Adjustments/Labs and Tests Ordered: Current medicines are reviewed at length with the patient today.  Concerns regarding medicines are outlined above.  Orders Placed This  Encounter  Procedures  . ALT  . Lipid Profile  . Basic Metabolic Panel (BMET)   Meds ordered this encounter  Medications  . furosemide (LASIX) 20 MG tablet    Sig: Take 1 tablet (20 mg total) by mouth daily.    Dispense:  90 tablet    Refill:  3  . aspirin EC 81 MG tablet    Sig: Take 1 tablet (81 mg total) by mouth daily.    Dispense:     . Potassium Bicarb-Citric Acid 10 MEQ TBEF    Sig: Take 1 tablet (10 mEq total) by mouth daily.    Dispense:  90 tablet    Refill:  3    Signed, , MD  08/24/2019 11:36 AM    [facility]

## 2019-08-24 ENCOUNTER — Encounter: Payer: Self-pay | Admitting: Cardiology

## 2019-08-24 ENCOUNTER — Ambulatory Visit (INDEPENDENT_AMBULATORY_CARE_PROVIDER_SITE_OTHER): Payer: 59 | Admitting: Cardiology

## 2019-08-24 ENCOUNTER — Other Ambulatory Visit: Payer: Self-pay

## 2019-08-24 VITALS — BP 110/62 | HR 73 | Ht 63.0 in | Wt 200.0 lb

## 2019-08-24 DIAGNOSIS — I10 Essential (primary) hypertension: Secondary | ICD-10-CM

## 2019-08-24 DIAGNOSIS — I2510 Atherosclerotic heart disease of native coronary artery without angina pectoris: Secondary | ICD-10-CM

## 2019-08-24 DIAGNOSIS — E785 Hyperlipidemia, unspecified: Secondary | ICD-10-CM | POA: Diagnosis not present

## 2019-08-24 LAB — LIPID PANEL
Chol/HDL Ratio: 3 ratio (ref 0.0–4.4)
Cholesterol, Total: 138 mg/dL (ref 100–199)
HDL: 46 mg/dL (ref >39)
LDL Chol Calc (NIH): 76 mg/dL (ref 0–99)
Triglycerides: 81 mg/dL (ref 0–149)
VLDL Cholesterol Cal: 16 mg/dL (ref 5–40)

## 2019-08-24 LAB — BASIC METABOLIC PANEL
BUN/Creatinine Ratio: 21 (ref 12–28)
BUN: 19 mg/dL (ref 8–27)
CO2: 23 mmol/L (ref 20–29)
Calcium: 9.3 mg/dL (ref 8.7–10.3)
Chloride: 106 mmol/L (ref 96–106)
Creatinine, Ser: 0.92 mg/dL (ref 0.57–1.00)
GFR calc Af Amer: 77 mL/min/{1.73_m2} (ref >59)
GFR calc non Af Amer: 67 mL/min/{1.73_m2} (ref >59)
Glucose: 95 mg/dL (ref 65–99)
Potassium: 4.4 mmol/L (ref 3.5–5.2)
Sodium: 143 mmol/L (ref 134–144)

## 2019-08-24 LAB — ALT: ALT: 57 IU/L — ABNORMAL HIGH (ref 0–32)

## 2019-08-24 MED ORDER — FUROSEMIDE 20 MG PO TABS: 20.0000 mg | ORAL_TABLET | Freq: Every day | ORAL | 3 refills | Status: DC

## 2019-08-24 MED ORDER — ASPIRIN EC 81 MG PO TBEC: 81.0000 mg | DELAYED_RELEASE_TABLET | Freq: Every day | ORAL | Status: DC

## 2019-08-24 MED ORDER — POTASSIUM BICARB-CITRIC ACID 10 MEQ PO TBEF: 10.0000 meq | EFFERVESCENT_TABLET | Freq: Every day | ORAL | 3 refills | Status: DC

## 2019-08-24 NOTE — Patient Instructions (Addendum)
Medication Instructions:  Your physician has recommended you make the following change in your medication:  DECREASE Aspirin to 81 mg once daily DECREASE Potassium chloride to 10 mEq - you will pick up the dissolving tablet DECREASE Furosemide (Lasix) to 20 mg once daily STOP Colchicine STOP Ibuprofen  *If you need a refill on your cardiac medications before your next appointment, please call your pharmacy*  Lab Work: TODAY - cholesterol, ALT, BMET If you have labs (blood work) drawn today and your tests are completely normal, you will receive your results only by: Marland Kitchen MyChart Message (if you have MyChart) OR . A paper copy in the mail If you have any lab test that is abnormal or we need to change your treatment, we will call you to review the results.  Testing/Procedures: None Ordered   Follow-Up: At [facility], you and your health needs are our priority.  As part of our continuing mission to provide you with exceptional heart care, we have created designated Provider Care Teams.  These Care Teams include your primary Cardiologist (physician) and Advanced Practice Providers (APPs -  Physician Assistants and Nurse Practitioners) who all work together to provide you with the care you need, when you need it.  Your next appointment:   6 months  The format for your next appointment:   In Person  Provider:   Ermalinda Barrios, PA-C

## 2019-08-25 ENCOUNTER — Encounter (HOSPITAL_COMMUNITY): Payer: Self-pay

## 2019-08-25 ENCOUNTER — Telehealth (HOSPITAL_COMMUNITY): Payer: Self-pay

## 2019-08-25 ENCOUNTER — Telehealth: Payer: Self-pay | Admitting: *Deleted

## 2019-08-25 DIAGNOSIS — I2510 Atherosclerotic heart disease of native coronary artery without angina pectoris: Secondary | ICD-10-CM

## 2019-08-25 DIAGNOSIS — E785 Hyperlipidemia, unspecified: Secondary | ICD-10-CM

## 2019-08-25 NOTE — Telephone Encounter (Signed)
DPR ok to s/w pt's daughter Talaya who has been notified of pt's lab results and recommendations per Dr. Radford Pax. Referral to Lipid Clinic placed and pt's daughter aware our office will call and set up appt with Lipid Clinic. Pt's daughter thanked me for the call. Patient notified of result.  Please refer to phone note from today for complete details.   Julaine Hua, [place] 08/25/2019 11:38 AM

## 2019-08-25 NOTE — Telephone Encounter (Signed)
Called the interpreting line and called pt, pt did not pick up. LMTCB  Mailed letter

## 2019-08-31 NOTE — Progress Notes (Signed)
Patient ID: Kimberly Bryant                 DOB: 1956/07/31                    MRN: [id number]     HPI: Kimberly Bryant is a 63 y.o. female patient referred to lipid clinic by Dr. Radford Pax. PMH is significant for CAD s/p acute NSTEMI (05/2019) followed by CABG x4, HTN, HLD, angioedema on ACE inhibitor, and GERD.  Patient presents to clinic today accompanied by daughter who translates for her. She confirms taking atorvastatin 80mg  every night with no adverse side effects. Most recent LDL is 76 (down from 109 while taking atorvastatin 40mg  with suspicion for nonadherence). She enjoys a wide variety of foods including noodles, lentils, beef, [place] and fish. She drinks primarily water and orange juice. 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  Current Medications: atorvastatin 80mg  daily Risk Factors: CAD s/p NSTEMI + CABG x4 LDL goal: < 55mg /dL  Diet:  Breakfast -  Noodles, cereals Lunch - lentils, meat (beef, [place], fish) Holiday representative - same as for lunch Snacks - none Beverage - water, orange juice  Exercise: none, works at [employer] evening shift  Family History: HTN (mother)  Social History: not a smoker, no alcohol or drug use  Labs: 08/24/19 - TC 138, TG 81, HDL 46, LDL 76, VLDL 16, ALT 57 (atorvastatin 80mg  daily) 12/10/2018 - TC 177, HDL 54, LDL 109, VLDL 14, LFTs normal (atorvastatin 40mg  daily, unsure of adherence)  Past Medical History:  Diagnosis Date  . CAD (coronary artery disease)   . Hyperlipidemia   . Hypertension   . Pleural effusion 06/24/2019   LEFT    Current Outpatient Medications on File Prior to Visit  Medication Sig Dispense Refill  . acetaminophen (TYLENOL) 325 MG tablet Take 2 tablets (650 mg total) by mouth every 6 (six) hours as needed for mild pain (or Fever >/= 101). 60 tablet 1  . amLODipine (NORVASC) 5 MG tablet Take 1 tablet (5 mg total) by mouth daily. 90 tablet 3  . aspirin EC 81 MG tablet Take 1 tablet (81 mg  total) by mouth daily.    Marland Kitchen atorvastatin (LIPITOR) 80 MG tablet Take 1 tablet (80 mg total) by mouth daily at 6 PM. 30 tablet 1  . docusate sodium (COLACE) 100 MG capsule Take 1 capsule (100 mg total) by mouth 2 (two) times daily as needed for mild constipation. 10 capsule 0  . furosemide (LASIX) 20 MG tablet Take 1 tablet (20 mg total) by mouth daily. 90 tablet 3  . ibuprofen (ADVIL) 400 MG tablet Take 1 tablet (400 mg total) by mouth 2 (two) times daily. 30 tablet 0  . lansoprazole (PREVACID) 30 MG capsule Take 1 capsule (30 mg total) by mouth 2 (two) times daily. To reduce stomach acid 60 capsule 2  . oxyCODONE-acetaminophen (PERCOCET/ROXICET) 5-325 MG tablet Take 1 tablet by mouth every 6 (six) hours as needed for moderate pain. 30 tablet 0  . Potassium Bicarb-Citric Acid 10 MEQ TBEF Take 1 tablet (10 mEq total) by mouth daily. 90 tablet 3  . potassium chloride SA (K-DUR) 20 MEQ tablet Take 1 tablet (20 mEq total) by mouth daily. 7 tablet 0  . [DISCONTINUED] lisinopril (ZESTRIL) 5 MG tablet Take 1 tablet (5 mg total) by mouth daily. 30 tablet 2  . [DISCONTINUED] metoprolol tartrate (LOPRESSOR) 25 MG tablet Take  1 tablet (25 mg total) by mouth 2 (two) times daily. 60 tablet 1   No current facility-administered medications on file prior to visit.     Allergies  Allergen Reactions  . Carafate [Sucralfate] Swelling and Other (See Comments)    Patient stated it made her face swell (angioedema)- no breathing issues, however  . Lisinopril Swelling and Other (See Comments)    Patient stated it made her face swell (angioedema)- no breathing issues, however    Assessment/Plan:  1. Hyperlipidemia - Patient remains above goal LDL < 55 mg/dL at 76mg /dL. Given significant cardiac history, will initiate Praluent 75mg /mL once every 14 days. Educated patient and daughter on importance of LDL reduction, efficacy of PCSK9i, injection technique, storage and disposal. Attempted to submit PA to insurance  company, but appears plan is no longer active. Once reactivated, will send PA and prescription to pharmacy with copay card. Will continue atorvastatin 80mg  daily - transient increase in LFTs not uncommon but pt can continue on statin therapy - will follow LFTs with follow up lab work to ensure they remain stable/improved. Encouraged a diet of lean meats, fresh vegetables and whole grain noodles. Follow up in 2-3 months for fasting lipid panel.  Patient seen by , P4 pharmacy student  Megan E. Supple, PharmD, BCACP, CPP [facility] [address], [place], [place] B[place] [address] Phone: [phone]; Fax: [phone] 09/01/2019 11:23 AM

## 2019-09-01 ENCOUNTER — Telehealth: Payer: Self-pay

## 2019-09-01 ENCOUNTER — Ambulatory Visit (INDEPENDENT_AMBULATORY_CARE_PROVIDER_SITE_OTHER): Payer: 59 | Admitting: Pharmacist

## 2019-09-01 ENCOUNTER — Other Ambulatory Visit: Payer: Self-pay

## 2019-09-01 DIAGNOSIS — E785 Hyperlipidemia, unspecified: Secondary | ICD-10-CM | POA: Diagnosis not present

## 2019-09-01 NOTE — Patient Instructions (Addendum)
Nice to see you today!  Keep up the good work with diet and exercise. Aim for a diet full of vegetables, fruit and lean meats (chicken, [place], fish). Try to limit carbs (bread, pasta, sugar, rice) and red meat consumption.  Your goal LDL is < 55 mg/dL, you're currently at 76 mg/dL  Medication Changes: Begin injecting Praluent 75mg /mL once every other week (any day of the week that works for you) into the fatty skin of stomach, upper outer thigh or back of the arm. Clean the site with soap and warm water or an alcohol pad. Keep the medication in the fridge until you are ready to give your dose, then take it out and let warm up to room temperature for 30-60 mins.  Continue taking atorvastatin 80mg  daily.   We will see you back on Dec 8th any time after 7:30 am for follow up labs. Please come without eating for at least 8 hours. Water is okay.   Please give [place] a call at [phone] with any questions or concerns.

## 2019-09-01 NOTE — Telephone Encounter (Signed)
LVM for patient daughter to return call to pharmacy line. [employer] and they stated her plan is no longer active. She will need to call the customer service line [phone]) to get plan reactivated or check with employer if insurance provided by company.

## 2019-09-02 NOTE — Telephone Encounter (Addendum)
Patient's daughter returned call to clinic - she states her mother's insurance is no longer active because she lost her job 3 months ago. Will mail patient Safety Net Application for [employer] pt assistance. Advised pt to fill out highlighted portions and return to clinic.

## 2019-09-04 ENCOUNTER — Other Ambulatory Visit: Payer: Self-pay | Admitting: Cardiology

## 2019-09-04 MED ORDER — POTASSIUM BICARB-CITRIC ACID 10 MEQ PO TBEF: 10.0000 meq | EFFERVESCENT_TABLET | Freq: Every day | ORAL | 3 refills | Status: DC

## 2019-09-04 NOTE — Telephone Encounter (Signed)
New message  Pt c/o medication issue:  1. Name of Medication:  Potassium Bicarb-Citric Acid 10 MEQ TBEF     2. How are you currently taking this medication (dosage and times per day)? As written  3. Are you having a reaction (difficulty breathing--STAT)? No   4. What is your medication issue? Per mary at [facility] please call to verify dosage. Please call.

## 2019-09-07 ENCOUNTER — Telehealth (HOSPITAL_COMMUNITY): Payer: Self-pay

## 2019-09-07 NOTE — Telephone Encounter (Signed)
Patient called to say that her insurance will be active on Wednesday. Will submit PA on Wed and contact patient one determination has been received.

## 2019-09-07 NOTE — Telephone Encounter (Signed)
No response from pt regarding CR.  Closed referral.  

## 2019-09-09 MED ORDER — PRALUENT 75 MG/ML ~~LOC~~ SOAJ: 1.0000 "pen " | SUBCUTANEOUS | 11 refills | Status: DC

## 2019-09-09 NOTE — Telephone Encounter (Addendum)
Prior authorization approved through 09/08/20. Rx sent to pharmacy. Called [facility] to check on copay, they stated pt needs to fill at [place]. Rx sent there and will follow up with copay information. Unable to activate copay card for pt (website stated pt not found).

## 2019-09-09 NOTE — Addendum Note (Signed)
Addended by: Hamlin Devine E on: 09/09/2019 12:54 PM   Modules accepted: Orders

## 2019-09-09 NOTE — Telephone Encounter (Signed)
Prior auth submitted to insurance.

## 2019-09-10 NOTE — Telephone Encounter (Signed)
Called specialty pharmacy again, still no copay update. Will try back tomorrow.

## 2019-09-10 NOTE — Telephone Encounter (Signed)
Called to follow up with cost and was advised that rx needed to be transferred from [facility] in [place] to [place] since the [facility] only fills Medicaid prescriptions. Transferred to [facility] who stated they still need to input the rx, advised [place] to follow up later [phone].

## 2019-09-11 NOTE — Telephone Encounter (Signed)
Called pharmacy and copay is $131.26. Unable to activate a copay card for patient as it says cannot find patient. There is a question of if patient is a [place] resident. I have called and left message for patients daughter to call back to discuss. Will give her the MyPraluent phone number to call for help  [phone]

## 2019-09-11 NOTE — Telephone Encounter (Addendum)
Patient returned call. She states she is ok with copay. I gave her the phone number to My Praluent for her to call about copay card issue. Patient states she will call pharmacy with info if she is able to get. Labs already scheduled for Dec 8

## 2019-09-24 ENCOUNTER — Telehealth: Payer: Self-pay | Admitting: Pharmacist

## 2019-09-24 NOTE — Telephone Encounter (Signed)
Have tried calling pt's daughter a few times to follow up to see if she was able to activate Praluent copay card for pt. Was unable to reach pt. Called pharmacy today - they have also tried to reach pt 3x this month on Nov 9, 11, and 18 but have not been able to reach her. Stated the copay is still $131 so pt has not activated copay card.  Tried calling pt's daughter again (pt is not [place] speaking), she did pick up. Provided her again with # for MyPraluent to activate copay card which will make Praluent free. Then provided her with # for specialty pharmacy again since she will need to provide copay card information to the pharmacy. She verbalized understanding.

## 2019-10-13 ENCOUNTER — Other Ambulatory Visit: Payer: 59

## 2019-10-14 ENCOUNTER — Telehealth: Payer: Self-pay | Admitting: Pharmacist

## 2019-10-14 NOTE — Telephone Encounter (Signed)
Pt missed lipid appt yesterday to assess Praluent efficacy. Called pharmacy as pt is non [place] speaking and I have had trouble getting in touch with her daughter.  Pharmacy states they still have not been able to reach pt at all and they have not shipped Praluent out to her at all.  Called daughter again and left message.  Will need to provider her with same info I provided her with on 11/19 phone encounter. She needs to call MyPraluent [phone] to activate copay card and then call [facility] [phone], provide them with copay card info, and coordinate delivery. Her insurance requires her to use this specific pharmacy and I cannot activate copay card since pt needs to call and confirm she is a [place] citizen.

## 2020-02-17 NOTE — Progress Notes (Signed)
Cardiology Office Note    Date:  02/23/2020   ID:  Kimberly Bryant, DOB 07/23/1956, MRN [id number]  PCP:  Antony Blackbird, MD  Cardiologist: Fransico Him, MD EPS: None  No chief complaint on file.   History of Present Illness:  Kimberly Bryant is a 64 y.o. female  with a hx of hypertension, HLD, angioedema on ACE inhibitor, GERD, CAD status post acute NSTEMI 05/2019 followed by CABG x4 with a LIMA to the LAD, SVG to OM, SVG to diagonal, SVG to distal LAD.  EF normal.  She came back to the hospital with sternal wound drainage and a left large pleural effusion.  She was taken to the OR for sternal debridement and wound VAC placement 06/23/2019 and also had a chest tube put in.  She was seen back 07/21/2019 and was doing ok although was having some problems with her not wanting to take her meds.  Last saw Dr. Radford Pax 08/2019 and doing better. Lasix reduced 20 mg daily.  Patient comes in accompanied by her daughter who interprets [place] for her. She feels like her chest is still healing. Not exercising at all. Denies chest pain, shortness of breath, dizziness or presyncope. Didn't reduce ASA or lasix as instructed LOV.   Past Medical History:  Diagnosis Date  . CAD (coronary artery disease)   . Hyperlipidemia   . Hypertension   . Pleural effusion 06/24/2019   LEFT    Past Surgical History:  Procedure Laterality Date  . APPLICATION OF WOUND VAC N/A 06/23/2019   Procedure: APPLICATION OF WOUND VAC;  Surgeon: Wonda Olds, MD;  Location: [facility];  Service: Thoracic;  Laterality: N/A;  . CORONARY ARTERY BYPASS GRAFT N/A 06/10/2019   Procedure: CORONARY ARTERY BYPASS GRAFTING (CABG) x Four, using left internal mammary artery and right leg greater saphenous vein harvested endoscopically;  Surgeon: Wonda Olds, MD;  Location: [facility];  Service: Open Heart Surgery;  Laterality: N/A;  . LEFT HEART CATH AND CORONARY ANGIOGRAPHY N/A 06/09/2019   Procedure: LEFT HEART CATH AND CORONARY ANGIOGRAPHY;   Surgeon: Troy Sine, MD;  Location: [facility];  Service: Cardiovascular;  Laterality: N/A;  . STERNAL WOUND DEBRIDEMENT N/A 06/23/2019   Procedure: STERNAL WOUND DEBRIDEMENT;  Surgeon: Wonda Olds, MD;  Location: [facility];  Service: Thoracic;  Laterality: N/A;  . TEE WITHOUT CARDIOVERSION N/A 06/10/2019   Procedure: TRANSESOPHAGEAL ECHOCARDIOGRAM (TEE);  Surgeon: Wonda Olds, MD;  Location: [facility];  Service: Open Heart Surgery;  Laterality: N/A;    Current Medications: Current Meds  Medication Sig  . acetaminophen (TYLENOL) 325 MG tablet Take 650 mg by mouth every 6 (six) hours as needed.  . Alirocumab (PRALUENT) 75 MG/ML SOAJ Inject 1 pen into the skin every 14 (fourteen) days.  Marland Kitchen docusate sodium (COLACE) 100 MG capsule Take 1 capsule (100 mg total) by mouth 2 (two) times daily as needed for mild constipation.  . furosemide (LASIX) 40 MG tablet Take 40 mg by mouth daily.  . metoprolol tartrate (LOPRESSOR) 25 MG tablet Take 25 mg by mouth 2 (two) times daily.  . [DISCONTINUED] acetaminophen (TYLENOL) 325 MG tablet Take 2 tablets (650 mg total) by mouth every 6 (six) hours as needed for mild pain (or Fever >/= 101). (Patient taking differently: Take 325 mg by mouth daily. )  . [DISCONTINUED] aspirin 325 MG tablet Take 325 mg by mouth daily.     Allergies:   Carafate [sucralfate] and Lisinopril   Social  History   Socioeconomic History  . Marital status: Married    Spouse name: Not on file  . Number of children: Not on file  . Years of education: Not on file  . Highest education level: Not on file  Occupational History  . Not on file  Tobacco Use  . Smoking status: Never Smoker  . Smokeless tobacco: Never Used  Substance and Sexual Activity  . Alcohol use: Never  . Drug use: Never  . Sexual activity: Not Currently  Other Topics Concern  . Not on file  Social History Narrative  . Not on file   Social Determinants of Health   Financial Resource Strain:   .  Difficulty of Paying Living Expenses:   Food Insecurity:   . Worried About Programme researcher, broadcasting/film/video in the Last Year:   . Barista in the Last Year:   Transportation Needs:   . Freight forwarder (Medical):   Marland Kitchen Lack of Transportation (Non-Medical):   Physical Activity:   . Days of Exercise per Week:   . Minutes of Exercise per Session:   Stress:   . Feeling of Stress :   Social Connections:   . Frequency of Communication with Friends and Family:   . Frequency of Social Gatherings with Friends and Family:   . Attends Religious Services:   . Active Member of Clubs or Organizations:   . Attends Banker Meetings:   Marland Kitchen Marital Status:      Family History:  The patient's family history includes Hypertension in her mother.   ROS:   Please see the history of present illness.    ROS All other systems reviewed and are negative.   PHYSICAL EXAM:   VS:  BP 126/82   Pulse 68   Ht 5\' 3"  (1.6 m)   Wt 209 lb 1.9 oz (94.9 kg)   SpO2 97%   BMI 37.04 kg/m   Physical Exam  GEN: Obese, in no acute distress  Neck: no JVD, carotid bruits, or masses Cardiac:RRR; no murmurs, rubs, or gallops  Respiratory:  clear to auscultation bilaterally, normal work of breathing GI: soft, nontender, nondistended, + BS Ext: without cyanosis, clubbing, or edema, Good distal pulses bilaterally Neuro:  Alert and Oriented x 3 Psych: euthymic mood, full affect  Wt Readings from Last 3 Encounters:  02/23/20 209 lb 1.9 oz (94.9 kg)  08/24/19 200 lb (90.7 kg)  08/21/19 201 lb 6.4 oz (91.4 kg)      Studies/Labs Reviewed:   EKG:  EKG is not ordered today.   Recent Labs: 06/23/2019: Magnesium 2.1 07/21/2019: Hemoglobin 9.2; Platelets 439 08/24/2019: ALT 57; BUN 19; Creatinine, Ser 0.92; Potassium 4.4; Sodium 143   Lipid Panel    Component Value Date/Time   CHOL 138 08/24/2019 1110   TRIG 81 08/24/2019 1110   HDL 46 08/24/2019 1110   CHOLHDL 3.0 08/24/2019 1110   LDLCALC 76 08/24/2019  1110    Additional studies/ records that were reviewed today include:  Intra-Op TEE 8/5/2020SUMMARY   - No dissection noted after cannula removed. - CO > 2.5, CI > 1.5 - Volume responsive  PRE-OP FINDINGS  Left Ventricle: The left ventricle has hyperdynamic systolic function, with an ejection fraction of >65%. The cavity size was normal. There is severe concentric left ventricular hypertrophy. No evidence of left ventricular regional wall motion  abnormalities.   Right Ventricle: The right ventricle has normal systolic function. The cavity was normal. There is no increase  in right ventricular wall thickness. Right ventricular systolic pressure is normal.   Left Atrium: Left atrial size was dilated. The left atrial appendage is well visualized and there is no evidence of thrombus present. Left atrial appendage velocity is normal at greater than 40 cm/s.   Right Atrium: Right atrial size was dilated. Right atrial pressure is estimated at 10 mmHg.   Interatrial Septum: No atrial level shunt detected by color flow Doppler.   Pericardium: A small pericardial effusion is present. The pericardial effusion is anterior to the right ventricle and surrounding the apex. There is no evidence of cardiac tamponade. There is no pleural effusion.   Mitral Valve: The mitral valve is normal in structure. Mild thickening of the mitral valve leaflet. No calcification of the mitral valve leaflet. Mitral valve regurgitation is mild to moderate by color flow Doppler. The MR jet is centrally-directed.  There is no evidence of mitral valve vegetation.   Tricuspid Valve: The tricuspid valve was normal in structure. Tricuspid valve regurgitation is trivial by color flow Doppler. The tricuspid valve is mildly thickened. No TV vegetation was visualized.   Aortic Valve: The aortic valve is normal in structure. Aortic valve regurgitation was not visualized by color flow Doppler. There is no evidence of aortic valve  stenosis. There is no evidence of a vegetation on the aortic valve.   Pulmonic Valve: The pulmonic valve was normal in structure No evidence of pumonic stenosis. Pulmonic valve regurgitation is not visualized by color flow Doppler.     Aorta: The aortic root and aortic arch are normal in size and structure. There is dilatation of the aortic root.   Pulmonary Artery: The pulmonary artery is of normal size and origin.     Shona Simpson MD Electronically signed by Shona Simpson MD Signature Date/Time: 06/10/2019/1:31:13 PM     Cardiac catheterization 06/09/2019  2nd Mrg lesion is 100% stenosed.  Prox Cx to Mid Cx lesion is 90% stenosed.  Prox LAD lesion is 60% stenosed.  Prox LAD to Mid LAD lesion is 95% stenosed.  Mid LAD lesion is 90% stenosed.  Dist LAD lesion is 90% stenosed.  Prox RCA to Mid RCA lesion is 100% stenosed.  Mid RCA to Dist RCA lesion is 100% stenosed.   Severe three-vessel coronary obstructive disease with 60 and 95% proximal LAD stenoses, 90% mid and diffuse 90% distal LAD stenoses; total recent occlusion of the second circumflex marginal vessel of the circumflex with contrast hang-up and 90% stenosis in the AV groove circumflex just after this marginal takeoff; and total mid RCA occlusion with possible small bridging collaterals with total distal RCA occlusion with collateralization distally via the proximal marginal branch and left circulation.   LVEDP 16 mm.  Left ventriculography was not done, but the echo report which came back during the procedure showed EF greater than 65%.   RECOMMENDATION: Surgical consultation for CABG revascularization surgery.          2D echo 8/4/2020IMPRESSIONS      1. The left ventricle has hyperdynamic systolic function, with an ejection fraction of >65%. The cavity size was normal. There is severely increased left ventricular wall thickness. Left ventricular diastolic Doppler parameters are consistent with    pseudonormalization. Elevated mean left atrial pressure No evidence of left ventricular regional wall motion abnormalities.  2. The right ventricle has normal systolic function. The cavity was normal. There is no increase in right ventricular wall thickness. Right ventricular systolic pressure is mildly elevated with an estimated  pressure of 32.1 mmHg.  3. Left atrial size was moderately dilated.  4. Right atrial size was mildly dilated.  5. Mild thickening of the mitral valve leaflet. Mitral valve regurgitation is moderate by color flow Doppler.  6. The aorta is normal in size and structure.  7. The aortic root is normal in size and structure.  8. The inferior vena cava was dilated in size with <50% respiratory variability.         ASSESSMENT:    1. Coronary artery disease involving coronary bypass graft of native heart without angina pectoris   2. Essential hypertension   3. Hyperlipidemia, unspecified hyperlipidemia type   4. Chronic diastolic CHF (congestive heart failure) (HCC)      PLAN:  In order of problems listed above: CAD status post end STEMI followed by CABG times 4-06/10/19 normal LVEF preop.  Readmitted with wound drainage and underwent debridement of sternal wound as well as left pleural effusion treated with chest tube-  now doing well. Reduce ASA 81 mg once daily, 150 min exercise weekly  Essential hypertension blood pressure controlled on metoprolol   Hyperlipidemia on prauluent   Chronic diastolic CHF lasix decrease lov but she is still taking 40 mg daily. Reduce to 20 mg daily and check labs.  [phone] education-haven't been vaccinated but gave them information about the vaccine.   Medication Adjustments/Labs and Tests Ordered: Current medicines are reviewed at length with the patient today.  Concerns regarding medicines are outlined above.  Medication changes, Labs and Tests ordered today are listed in the Patient Instructions below. There are no Patient  Instructions on file for this visit.   Elson Clan, PA-C  02/23/2020 11:20 AM    [facility] [address] [place], [place], [place]  [address] Phone: [phone]; Fax: [phone]

## 2020-02-23 ENCOUNTER — Other Ambulatory Visit: Payer: Self-pay

## 2020-02-23 ENCOUNTER — Encounter: Payer: Self-pay | Admitting: Physician Assistant

## 2020-02-23 ENCOUNTER — Ambulatory Visit (INDEPENDENT_AMBULATORY_CARE_PROVIDER_SITE_OTHER): Payer: 59 | Admitting: Physician Assistant

## 2020-02-23 VITALS — BP 126/82 | HR 68 | Ht 63.0 in | Wt 209.1 lb

## 2020-02-23 DIAGNOSIS — E785 Hyperlipidemia, unspecified: Secondary | ICD-10-CM | POA: Diagnosis not present

## 2020-02-23 DIAGNOSIS — I25810 Atherosclerosis of coronary artery bypass graft(s) without angina pectoris: Secondary | ICD-10-CM | POA: Diagnosis not present

## 2020-02-23 DIAGNOSIS — I10 Essential (primary) hypertension: Secondary | ICD-10-CM

## 2020-02-23 DIAGNOSIS — I5032 Chronic diastolic (congestive) heart failure: Secondary | ICD-10-CM | POA: Diagnosis not present

## 2020-02-23 MED ORDER — ASPIRIN EC 81 MG PO TBEC: 81.0000 mg | DELAYED_RELEASE_TABLET | Freq: Every day | ORAL | 3 refills | Status: DC

## 2020-02-23 MED ORDER — FUROSEMIDE 20 MG PO TABS: 20.0000 mg | ORAL_TABLET | Freq: Every day | ORAL | 3 refills | Status: DC

## 2020-02-23 NOTE — Patient Instructions (Signed)
Medication Instructions:  Your physician has recommended you make the following change in your medication:   1. DECREASE: furosemide (lasix) to 20 mg once a day  2. DECREASE: aspirin to 81 mg once a day  *If you need a refill on your cardiac medications before your next appointment, please call your pharmacy*   Lab Work: TODAY: CBC, BMET  If you have labs (blood work) drawn today and your tests are completely normal, you will receive your results only by: Marland Kitchen MyChart Message (if you have MyChart) OR . A paper copy in the mail If you have any lab test that is abnormal or we need to change your treatment, we will call you to review the results.   Testing/Procedures: None ordered   Follow-Up: At [facility], you and your health needs are our priority.  As part of our continuing mission to provide you with exceptional heart care, we have created designated Provider Care Teams.  These Care Teams include your primary Cardiologist (physician) and Advanced Practice Providers (APPs -  Physician Assistants and Nurse Practitioners) who all work together to provide you with the care you need, when you need it.  We recommend signing up for the patient portal called "MyChart".  Sign up information is provided on this After Visit Summary.  MyChart is used to connect with patients for Virtual Visits (Telemedicine).  Patients are able to view lab/test results, encounter notes, upcoming appointments, etc.  Non-urgent messages can be sent to your provider as well.   To learn more about what you can do with MyChart, go to [email or web address].    Your next appointment:   6 month(s)  The format for your next appointment:   In Person  Provider:   You may see Armanda Magic, MD or one of the following Advanced Practice Providers on your designated Care Team:    Ronie Spies, PA-C  Jacolyn Reedy, PA-C    Other Instructions  Your provider recommends that you maintain 20-30 minutes of moderate  aerobic activity a day.  We are recommending the COVID-19 vaccine to all of our patients. Cardiac medications (including blood thinners) should not deter anyone from being vaccinated and there is no need to hold any of those medications prior to vaccine administration.   Currently, there is a hotline to call (active 11/13/19) to schedule vaccination appointments as no walk-ins will be accepted.    Vaccines through the health department can be arranged by calling [phone]    Vaccines through [facility] can be arranged by calling [phone] or visiting [email or web address]   If you have further questions or concerns about the vaccine process, please visit [email or web address], [email or web address], or contact your primary care physician.

## 2020-02-24 LAB — COMPREHENSIVE METABOLIC PANEL
ALT: 26 IU/L (ref 0–32)
AST: 19 IU/L (ref 0–40)
Albumin/Globulin Ratio: 1.6 (ref 1.2–2.2)
Albumin: 4.2 g/dL (ref 3.8–4.8)
Alkaline Phosphatase: 79 IU/L (ref 39–117)
BUN/Creatinine Ratio: 17 (ref 12–28)
BUN: 17 mg/dL (ref 8–27)
Bilirubin Total: <0.2 mg/dL (ref 0.0–1.2)
CO2: 23 mmol/L (ref 20–29)
Calcium: 8.9 mg/dL (ref 8.7–10.3)
Chloride: 108 mmol/L — ABNORMAL HIGH (ref 96–106)
Creatinine, Ser: 1.03 mg/dL — ABNORMAL HIGH (ref 0.57–1.00)
GFR calc Af Amer: 67 mL/min/{1.73_m2} (ref >59)
GFR calc non Af Amer: 58 mL/min/{1.73_m2} — ABNORMAL LOW (ref >59)
Globulin, Total: 2.6 g/dL (ref 1.5–4.5)
Glucose: 99 mg/dL (ref 65–99)
Potassium: 4.6 mmol/L (ref 3.5–5.2)
Sodium: 145 mmol/L — ABNORMAL HIGH (ref 134–144)
Total Protein: 6.8 g/dL (ref 6.0–8.5)

## 2020-02-24 LAB — CBC
Hematocrit: 39.1 % (ref 34.0–46.6)
Hemoglobin: 12.4 g/dL (ref 11.1–15.9)
MCH: 27.6 pg (ref 26.6–33.0)
MCHC: 31.7 g/dL (ref 31.5–35.7)
MCV: 87 fL (ref 79–97)
Platelets: 283 10*3/uL (ref 150–450)
RBC: 4.49 x10E6/uL (ref 3.77–5.28)
RDW: 13.1 % (ref 11.7–15.4)
WBC: 6.5 10*3/uL (ref 3.4–10.8)

## 2020-03-02 ENCOUNTER — Telehealth: Payer: Self-pay | Admitting: Physician Assistant

## 2020-03-02 NOTE — Telephone Encounter (Signed)
-----   Message from Dyann Kief, PA-C sent at 02/24/2020  3:47 PM EDT ----- Labs look good, no longer anemic, no further changes

## 2020-03-02 NOTE — Telephone Encounter (Signed)
Kimberly Bryant is returning Brittany's ca;; in regards to lab results. Please advise.

## 2020-03-02 NOTE — Telephone Encounter (Signed)
The patient has been notified of the result and verbalized understanding.  All questions (if any) were answered. Lattie Haw, RN 03/02/2020 2:09 PM

## 2020-08-16 ENCOUNTER — Ambulatory Visit: Payer: Self-pay | Admitting: Cardiology

## 2020-09-04 ENCOUNTER — Other Ambulatory Visit: Payer: Self-pay

## 2020-09-04 ENCOUNTER — Emergency Department (HOSPITAL_COMMUNITY): Payer: Self-pay

## 2020-09-04 ENCOUNTER — Inpatient Hospital Stay (HOSPITAL_COMMUNITY)
Admission: EM | Admit: 2020-09-04 | Discharge: 2020-09-07 | DRG: 638 | Disposition: A | Discharge status: Home / Self Care | Payer: Self-pay | Attending: Internal Medicine | Admitting: Internal Medicine

## 2020-09-04 ENCOUNTER — Encounter (HOSPITAL_COMMUNITY): Payer: Self-pay | Admitting: Emergency Medicine

## 2020-09-04 DIAGNOSIS — Z888 Allergy status to other drugs, medicaments and biological substances status: Secondary | ICD-10-CM

## 2020-09-04 DIAGNOSIS — I2510 Atherosclerotic heart disease of native coronary artery without angina pectoris: Secondary | ICD-10-CM | POA: Diagnosis present

## 2020-09-04 DIAGNOSIS — K5909 Other constipation: Secondary | ICD-10-CM | POA: Diagnosis present

## 2020-09-04 DIAGNOSIS — I5032 Chronic diastolic (congestive) heart failure: Secondary | ICD-10-CM | POA: Diagnosis present

## 2020-09-04 DIAGNOSIS — E785 Hyperlipidemia, unspecified: Secondary | ICD-10-CM | POA: Diagnosis present

## 2020-09-04 DIAGNOSIS — R8271 Bacteriuria: Secondary | ICD-10-CM | POA: Diagnosis present

## 2020-09-04 DIAGNOSIS — R81 Glycosuria: Secondary | ICD-10-CM | POA: Diagnosis present

## 2020-09-04 DIAGNOSIS — E1100 Type 2 diabetes mellitus with hyperosmolarity without nonketotic hyperglycemic-hyperosmolar coma (NKHHC): Principal | ICD-10-CM | POA: Diagnosis present

## 2020-09-04 DIAGNOSIS — Z8249 Family history of ischemic heart disease and other diseases of the circulatory system: Secondary | ICD-10-CM

## 2020-09-04 DIAGNOSIS — K219 Gastro-esophageal reflux disease without esophagitis: Secondary | ICD-10-CM | POA: Diagnosis present

## 2020-09-04 DIAGNOSIS — Z7982 Long term (current) use of aspirin: Secondary | ICD-10-CM

## 2020-09-04 DIAGNOSIS — M79601 Pain in right arm: Secondary | ICD-10-CM | POA: Diagnosis present

## 2020-09-04 DIAGNOSIS — E875 Hyperkalemia: Secondary | ICD-10-CM | POA: Diagnosis present

## 2020-09-04 DIAGNOSIS — I110 Hypertensive heart disease with heart failure: Secondary | ICD-10-CM | POA: Diagnosis present

## 2020-09-04 DIAGNOSIS — I252 Old myocardial infarction: Secondary | ICD-10-CM

## 2020-09-04 DIAGNOSIS — R739 Hyperglycemia, unspecified: Secondary | ICD-10-CM

## 2020-09-04 DIAGNOSIS — Z951 Presence of aortocoronary bypass graft: Secondary | ICD-10-CM

## 2020-09-04 DIAGNOSIS — Z79899 Other long term (current) drug therapy: Secondary | ICD-10-CM

## 2020-09-04 DIAGNOSIS — Z20822 Contact with and (suspected) exposure to covid-19: Secondary | ICD-10-CM | POA: Diagnosis present

## 2020-09-04 DIAGNOSIS — E1165 Type 2 diabetes mellitus with hyperglycemia: Secondary | ICD-10-CM | POA: Diagnosis present

## 2020-09-04 DIAGNOSIS — E669 Obesity, unspecified: Secondary | ICD-10-CM | POA: Diagnosis present

## 2020-09-04 DIAGNOSIS — I10 Essential (primary) hypertension: Secondary | ICD-10-CM | POA: Diagnosis present

## 2020-09-04 LAB — URINALYSIS, ROUTINE W REFLEX MICROSCOPIC
Bilirubin Urine: NEGATIVE
Glucose, UA: >=500 mg/dL — AB
Hgb urine dipstick: NEGATIVE
Ketones, ur: 20 mg/dL — AB
Leukocytes,Ua: NEGATIVE
Nitrite: NEGATIVE
Protein, ur: NEGATIVE mg/dL
Specific Gravity, Urine: 1.015 (ref 1.005–1.030)
pH: 5 (ref 5.0–8.0)

## 2020-09-04 LAB — BASIC METABOLIC PANEL
Anion gap: 15 (ref 5–15)
Anion gap: 11 (ref 5–15)
BUN: 24 mg/dL — ABNORMAL HIGH (ref 8–23)
BUN: 17 mg/dL (ref 8–23)
CO2: 21 mmol/L — ABNORMAL LOW (ref 22–32)
CO2: 22 mmol/L (ref 22–32)
Calcium: 9.9 mg/dL (ref 8.9–10.3)
Calcium: 8.3 mg/dL — ABNORMAL LOW (ref 8.9–10.3)
Chloride: 102 mmol/L (ref 98–111)
Chloride: 112 mmol/L — ABNORMAL HIGH (ref 98–111)
Creatinine, Ser: 1.36 mg/dL — ABNORMAL HIGH (ref 0.44–1.00)
Creatinine, Ser: 0.89 mg/dL (ref 0.44–1.00)
GFR, Estimated: 44 mL/min — ABNORMAL LOW (ref >60)
GFR, Estimated: >60 mL/min (ref >60)
Glucose, Bld: 714 mg/dL (ref 70–99)
Glucose, Bld: 400 mg/dL — ABNORMAL HIGH (ref 70–99)
Potassium: 5.6 mmol/L — ABNORMAL HIGH (ref 3.5–5.1)
Potassium: 3.3 mmol/L — ABNORMAL LOW (ref 3.5–5.1)
Sodium: 138 mmol/L (ref 135–145)
Sodium: 145 mmol/L (ref 135–145)

## 2020-09-04 LAB — TROPONIN I (HIGH SENSITIVITY)
Troponin I (High Sensitivity): 30 ng/L — ABNORMAL HIGH (ref <18)
Troponin I (High Sensitivity): 28 ng/L — ABNORMAL HIGH (ref <18)

## 2020-09-04 LAB — CBC
HCT: 48 % — ABNORMAL HIGH (ref 36.0–46.0)
HCT: 44.7 % (ref 36.0–46.0)
Hemoglobin: 15.1 g/dL — ABNORMAL HIGH (ref 12.0–15.0)
Hemoglobin: 14 g/dL (ref 12.0–15.0)
MCH: 27.5 pg (ref 26.0–34.0)
MCH: 27.2 pg (ref 26.0–34.0)
MCHC: 31.5 g/dL (ref 30.0–36.0)
MCHC: 31.3 g/dL (ref 30.0–36.0)
MCV: 87.4 fL (ref 80.0–100.0)
MCV: 86.8 fL (ref 80.0–100.0)
Platelets: 233 10*3/uL (ref 150–400)
Platelets: 222 10*3/uL (ref 150–400)
RBC: 5.49 MIL/uL — ABNORMAL HIGH (ref 3.87–5.11)
RBC: 5.15 MIL/uL — ABNORMAL HIGH (ref 3.87–5.11)
RDW: 12.5 % (ref 11.5–15.5)
RDW: 12.5 % (ref 11.5–15.5)
WBC: 7.5 10*3/uL (ref 4.0–10.5)
WBC: 6.8 10*3/uL (ref 4.0–10.5)
nRBC: 0 % (ref 0.0–0.2)
nRBC: 0 % (ref 0.0–0.2)

## 2020-09-04 LAB — I-STAT VENOUS BLOOD GAS, ED
Acid-base deficit: 3 mmol/L — ABNORMAL HIGH (ref 0.0–2.0)
Bicarbonate: 21.6 mmol/L (ref 20.0–28.0)
Calcium, Ion: 1.17 mmol/L (ref 1.15–1.40)
HCT: 48 % — ABNORMAL HIGH (ref 36.0–46.0)
Hemoglobin: 16.3 g/dL — ABNORMAL HIGH (ref 12.0–15.0)
O2 Saturation: 62 %
Potassium: 5.6 mmol/L — ABNORMAL HIGH (ref 3.5–5.1)
Sodium: 139 mmol/L (ref 135–145)
TCO2: 23 mmol/L (ref 22–32)
pCO2, Ven: 37.8 mmHg — ABNORMAL LOW (ref 44.0–60.0)
pH, Ven: 7.364 (ref 7.250–7.430)
pO2, Ven: 33 mmHg (ref 32.0–45.0)

## 2020-09-04 LAB — RESPIRATORY PANEL BY RT PCR (FLU A&B, COVID)
Influenza A by PCR: NEGATIVE
Influenza B by PCR: NEGATIVE
SARS Coronavirus 2 by RT PCR: NEGATIVE

## 2020-09-04 LAB — CK: Total CK: 59 U/L (ref 38–234)

## 2020-09-04 LAB — HEPATIC FUNCTION PANEL
ALT: 21 U/L (ref 0–44)
AST: 14 U/L — ABNORMAL LOW (ref 15–41)
Albumin: 3.9 g/dL (ref 3.5–5.0)
Alkaline Phosphatase: 75 U/L (ref 38–126)
Bilirubin, Direct: <0.1 mg/dL (ref 0.0–0.2)
Total Bilirubin: 0.8 mg/dL (ref 0.3–1.2)
Total Protein: 7.8 g/dL (ref 6.5–8.1)

## 2020-09-04 LAB — CBG MONITORING, ED
Glucose-Capillary: >600 mg/dL (ref 70–99)
Glucose-Capillary: 583 mg/dL (ref 70–99)
Glucose-Capillary: 383 mg/dL — ABNORMAL HIGH (ref 70–99)

## 2020-09-04 LAB — OSMOLALITY: Osmolality: 348 mOsm/kg (ref 275–295)

## 2020-09-04 MED ORDER — LACTATED RINGERS IV SOLN: INTRAVENOUS | Status: DC

## 2020-09-04 MED ORDER — DEXTROSE IN LACTATED RINGERS 5 % IV SOLN: INTRAVENOUS | Status: DC

## 2020-09-04 MED ORDER — INSULIN REGULAR(HUMAN) IN NACL 100-0.9 UT/100ML-% IV SOLN
INTRAVENOUS | Status: DC
  Administered 2020-09-04: 6 [IU]/h via INTRAVENOUS
  Filled 2020-09-04: qty 100

## 2020-09-04 MED ORDER — DEXTROSE 50 % IV SOLN: 0.0000 mL | INTRAVENOUS | Status: DC | PRN

## 2020-09-04 MED ORDER — ENOXAPARIN SODIUM 40 MG/0.4ML ~~LOC~~ SOLN
40.0000 mg | SUBCUTANEOUS | Status: DC
  Administered 2020-09-05 – 2020-09-06 (×3): 40 mg via SUBCUTANEOUS
  Filled 2020-09-04 (×3): qty 0.4

## 2020-09-04 MED ORDER — LACTATED RINGERS IV BOLUS
1800.0000 mL | Freq: Once | INTRAVENOUS | Status: AC
  Administered 2020-09-04: 1800 mL via INTRAVENOUS

## 2020-09-04 MED ORDER — LACTATED RINGERS IV BOLUS
20.0000 mL/kg | Freq: Once | INTRAVENOUS | Status: AC
  Administered 2020-09-04: 1000 mL via INTRAVENOUS

## 2020-09-04 MED ORDER — INSULIN ASPART 100 UNIT/ML ~~LOC~~ SOLN
5.0000 [IU] | Freq: Once | SUBCUTANEOUS | Status: AC
  Administered 2020-09-04: 5 [IU] via SUBCUTANEOUS

## 2020-09-04 MED ORDER — INSULIN REGULAR(HUMAN) IN NACL 100-0.9 UT/100ML-% IV SOLN: INTRAVENOUS | Status: DC

## 2020-09-04 MED ORDER — SODIUM CHLORIDE 0.9 % IV BOLUS
1000.0000 mL | Freq: Once | INTRAVENOUS | Status: AC
  Administered 2020-09-04: 1000 mL via INTRAVENOUS

## 2020-09-04 NOTE — ED Provider Notes (Signed)
[facility] EMERGENCY DEPARTMENT Provider Note   CSN: [id number] Arrival date & time: 09/04/20  1733     History Chief Complaint  Patient presents with  . Hyperglycemia    Kimberly Bryant is a 64 y.o. female history of CAD, hypertension, hyperlipidemia here presenting with right arm spasms and increased thirst and polydipsia and weakness.  Patient is a very difficult historian and states that she just does not feel well.  When asked more detailed, patient states that for the last week or so she has been very thirsty and has been urinating a lot.  Since yesterday she has some cramps in the right arm and feels very weak all over.  Patient denies any fevers or vomiting or abdominal pain.  Patient does not know what medications she takes.  Upon review of records, patient does follow-up with cardiology and there is no reported history of diabetes.  Her glucose was normal and her chemistry several months ago.  Patient has a history of NSTEMI denies any chest pain right now.  The history is provided by the patient. A language interpreter was used.  [place] interpreter used     Past Medical History:  Diagnosis Date  . CAD (coronary artery disease)   . Hyperlipidemia   . Hypertension   . Pleural effusion 06/24/2019   LEFT    Patient Active Problem List   Diagnosis Date Noted  . Pleural effusion on left 06/21/2019  . 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 Atypical chest pain   . Elevated troponin   . Hypertensive urgency   . NSTEMI (non-ST elevated myocardial infarction) (HCC)   . ACS (acute coronary syndrome) (HCC) 06/08/2019  . CAD (coronary artery disease) 09/15/2018  . HTN (hypertension) 09/15/2018  . GERD (gastroesophageal reflux disease) 09/15/2018  . Chest pain, atypical 09/15/2018  . Hyperlipidemia 09/15/2018    Past Surgical History:  Procedure Laterality Date  . APPLICATION OF WOUND VAC N/A 06/23/2019   Procedure: APPLICATION OF WOUND VAC;  Surgeon: Linden Dolin, MD;  Location: [facility] OR;  Service: Thoracic;  Laterality: N/A;  . CORONARY ARTERY BYPASS GRAFT N/A 06/10/2019   Procedure: CORONARY ARTERY BYPASS GRAFTING (CABG) x Four, using left internal mammary artery and right leg greater saphenous vein harvested endoscopically;  Surgeon: Linden Dolin, MD;  Location: [facility] OR;  Service: Open Heart Surgery;  Laterality: N/A;  . LEFT HEART CATH AND CORONARY ANGIOGRAPHY N/A 06/09/2019   Procedure: LEFT HEART CATH AND CORONARY ANGIOGRAPHY;  Surgeon: Lennette Bihari, MD;  Location: [facility] INVASIVE CV LAB;  Service: Cardiovascular;  Laterality: N/A;  . STERNAL WOUND DEBRIDEMENT N/A 06/23/2019   Procedure: STERNAL WOUND DEBRIDEMENT;  Surgeon: Linden Dolin, MD;  Location: [facility] OR;  Service: Thoracic;  Laterality: N/A;  . TEE WITHOUT CARDIOVERSION N/A 06/10/2019   Procedure: TRANSESOPHAGEAL ECHOCARDIOGRAM (TEE);  Surgeon: Linden Dolin, MD;  Location: [facility] OR;  Service: Open Heart Surgery;  Laterality: N/A;     OB History   No obstetric history on file.     Family History  Problem Relation Age of Onset  . Hypertension Mother     Social History   Tobacco Use  . Smoking status: Never Smoker  . Smokeless tobacco: Never Used  Vaping Use  . Vaping Use: Never used  Substance Use Topics  . Alcohol use: Never  . Drug use: Never    Home Medications Prior to Admission medications   Medication Sig Start Date End Date Taking? Authorizing  Provider  acetaminophen (TYLENOL) 325 MG tablet Take 650 mg by mouth every 6 (six) hours as needed.    [provider]  Alirocumab (PRALUENT) 75 MG/ML SOAJ Inject 1 pen into the skin every 14 (fourteen) days. 09/09/19   Quintella Reichert, MD  aspirin EC 81 MG tablet Take 1 tablet (81 mg total) by mouth daily. 02/23/20   Dyann Kief, PA-C  docusate sodium (COLACE) 100 MG capsule Take 1 capsule (100 mg total) by mouth 2 (two) times daily as needed for mild constipation. 07/02/19   Sharlene Dory, PA-C  furosemide (LASIX)  20 MG tablet Take 1 tablet (20 mg total) by mouth daily. 02/23/20   Dyann Kief, PA-C  metoprolol tartrate (LOPRESSOR) 25 MG tablet Take 25 mg by mouth 2 (two) times daily.    [provider]    Allergies    Carafate [sucralfate] and Lisinopril  Review of Systems   Review of Systems  Neurological: Positive for dizziness and weakness.  All other systems reviewed and are negative.   Physical Exam Updated Vital Signs BP (!) 163/88   Pulse 91   Temp 98.6 F (37 C) (Oral)   Resp 17   SpO2 100%   Physical Exam Vitals and nursing note reviewed.  Constitutional:      Comments: Chronically ill   HENT:     Head: Normocephalic.     Mouth/Throat:     Mouth: Mucous membranes are dry.  Eyes:     Extraocular Movements: Extraocular movements intact.     Pupils: Pupils are equal, round, and reactive to light.  Cardiovascular:     Rate and Rhythm: Normal rate and regular rhythm.     Pulses: Normal pulses.     Heart sounds: Normal heart sounds.  Pulmonary:     Effort: Pulmonary effort is normal.     Breath sounds: Normal breath sounds.  Abdominal:     General: Abdomen is flat.     Palpations: Abdomen is soft.  Musculoskeletal:        General: Normal range of motion.     Cervical back: Normal range of motion.     Comments: Occasional right arm twitches but no obvious deformity.  Skin:    General: Skin is warm.     Capillary Refill: Capillary refill takes less than 2 seconds.  Neurological:     Mental Status: She is alert and oriented to person, place, and time.     Comments: Cranial nerves II to XII intact.  Patient does have occasional right arm twitching movements.  Patient does not have any eye deviation to suggest seizures.  Psychiatric:        Mood and Affect: Mood normal.        Behavior: Behavior normal.     ED Results / Procedures / Treatments   Labs (all labs ordered are listed, but only abnormal results are displayed) Labs Reviewed  BASIC METABOLIC  PANEL - Abnormal; Notable for the following components:      Result Value   Potassium 5.6 (*)    CO2 21 (*)    Glucose, Bld 714 (*)    BUN 24 (*)    Creatinine, Ser 1.36 (*)    GFR, Estimated 44 (*)    All other components within normal limits  CBC - Abnormal; Notable for the following components:   RBC 5.49 (*)    Hemoglobin 15.1 (*)    HCT 48.0 (*)    All other components  within normal limits  HEPATIC FUNCTION PANEL - Abnormal; Notable for the following components:   AST 14 (*)    All other components within normal limits  CBG MONITORING, ED - Abnormal; Notable for the following components:   Glucose-Capillary >600 (*)    All other components within normal limits  CBG MONITORING, ED - Abnormal; Notable for the following components:   Glucose-Capillary 583 (*)    All other components within normal limits  I-STAT VENOUS BLOOD GAS, ED - Abnormal; Notable for the following components:   pCO2, Ven 37.8 (*)    Acid-base deficit 3.0 (*)    Potassium 5.6 (*)    HCT 48.0 (*)    Hemoglobin 16.3 (*)    All other components within normal limits  TROPONIN I (HIGH SENSITIVITY) - Abnormal; Notable for the following components:   Troponin I (High Sensitivity) 30 (*)    All other components within normal limits  RESPIRATORY PANEL BY RT PCR (FLU A&B, COVID)  CK  URINALYSIS, ROUTINE W REFLEX MICROSCOPIC  TROPONIN I (HIGH SENSITIVITY)    EKG None  Radiology CT Head Wo Contrast  Result Date: 09/04/2020 CLINICAL DATA:  Right arm pain and weakness. EXAM: CT HEAD WITHOUT CONTRAST TECHNIQUE: Contiguous axial images were obtained from the base of the skull through the vertex without intravenous contrast. COMPARISON:  None. FINDINGS: Brain: There is mild cerebral atrophy with widening of the extra-axial spaces and ventricular dilatation. There are areas of decreased attenuation within the white matter tracts of the supratentorial brain, consistent with microvascular disease changes. Normal basal  ganglia. Normal bilateral thalami. Vascular: No hyperdense vessel or unexpected calcification. Skull: Normal. Negative for fracture or focal lesion. Sinuses/Orbits: No acute finding. Other: None. IMPRESSION: 1. Mild cerebral atrophy and microvascular disease changes of the supratentorial brain. 2. No acute intracranial abnormality. Electronically Signed   By: Aram Candelahaddeus  Houston M.D.   On: 09/04/2020 19:49   DG Chest Port 1 View  Result Date: 09/04/2020 CLINICAL DATA:  Weakness. EXAM: PORTABLE CHEST 1 VIEW COMPARISON:  08/21/2019 FINDINGS: Median sternotomy and CABG. Heart is enlarged and stable in configuration. There are no focal consolidations. No pleural effusions or pulmonary edema. IMPRESSION: Cardiomegaly. Electronically Signed   By: Norva PavlovElizabeth  Brown M.D.   On: 09/04/2020 18:45    Procedures Procedures (including critical care time)   CRITICAL CARE Performed by: Richardean Canalavid H Jaystin Mcgarvey   Total critical care time: 30 minutes  Critical care time was exclusive of separately billable procedures and treating other patients.  Critical care was necessary to treat or prevent imminent or life-threatening deterioration.  Critical care was time spent personally by me on the following activities: development of treatment plan with patient and/or surrogate as well as nursing, discussions with consultants, evaluation of patient's response to treatment, examination of patient, obtaining history from patient or surrogate, ordering and performing treatments and interventions, ordering and review of laboratory studies, ordering and review of radiographic studies, pulse oximetry and re-evaluation of patient's condition.   Medications Ordered in ED Medications  insulin regular, human (MYXREDLIN) 100 units/ 100 mL infusion (has no administration in time range)  lactated ringers infusion (has no administration in time range)  dextrose 5 % in lactated ringers infusion (has no administration in time range)  dextrose  50 % solution 0-50 mL (has no administration in time range)  sodium chloride 0.9 % bolus 1,000 mL (0 mLs Intravenous Stopped 09/04/20 1950)  insulin aspart (novoLOG) injection 5 Units (5 Units Subcutaneous Given 09/04/20 1904)  lactated  ringers bolus 20 mL/kg (1,000 mLs Intravenous New Bag/Given 09/04/20 1913)    ED Course  I have reviewed the triage vital signs and the nursing notes.  Pertinent labs & imaging results that were available during my care of the patient were reviewed by me and considered in my medical decision making (see chart for details).    MDM Rules/Calculators/A&P                         MIOSHA BEHE is a 64 y.o. female who presented with weakness and right arm spasms.  I do not think she has focal seizures.  Her glucose is greater than 600 with no history of diabetes.  I suspect component of HHS.  Plan to get CBC and CMP and VBG and CT head.  Will need IV fluids and give her IV insulin.  8:06 PM CT head unremarkable. AG is 15. CK normal.  Ordered glucose stabilizes for HHS given the right arm spasms.  Will admit to the hospital for new onset diabetes, possible HHS.    Final Clinical Impression(s) / ED Diagnoses Final diagnoses:  None    Rx / DC Orders ED Discharge Orders    None       Charlynne Pander, MD 09/04/20 2007

## 2020-09-04 NOTE — ED Notes (Signed)
Patients niece would like to be contacted for updates on status. Tanaya Dunigan [phone]

## 2020-09-04 NOTE — H&P (Signed)
History and Physical   Kimberly Bryant XBJ:[id number] DOB: 05/15/1956 DOA: 09/04/2020  Referring MD/NP/PA: Dr. Silverio LayYao  PCP: Cain SaupeFulp, Cammie, MD   Outpatient Specialists: Dr. Armanda Magicraci Turner, cardiology  Patient coming from: Home  Chief Complaint: High blood sugar  HPI: Kimberly SamMarie C Manasco is a 64 y.o. female with medical history significant of coronary artery disease status post coronary artery bypass grafting, hyperlipidemia, hypertension, who is non-English speaking presenting to the ER with blood sugar more than 700.  Patient has had multiple hospitalizations and office visits in the past.  Was not a known diabetic.  Previous hemoglobin A1c was 5.9.  Today however she is here for weak with malaise.  She is a difficult historian even through the interpreter.  Patient did not indicate that she took a lot of sugar.  She has also not had any recent steroids.  No indication that she has family history of diabetes.  She is not in DKA.  She has no altered mental status.  She does appear to be dry mucous membranes.  She has been initiated on insulin drip as seems to have new onset diabetes..  ED Course: Temperature 98.6, blood pressure 160/95 pulse 106 respiratory rate of 20 oxygen sats 99% on room air. White count 7.5 hemoglobin 16.3 and platelets 233.  Sodium 139 potassium 5.6 chloride 102 CO2 21 BUN 24 creatinine 1.36 calcium 9.9 glucose of 114.  Urinalysis showed glycosuria otherwise no significant findings head CT without contrast is negative.  Troponin of 30.  Patient being admitted with nonketotic hyperglycemia.  Her pH is 7.36.  Start on insulin drip.  Review of Systems: As per HPI otherwise 10 point review of systems negative.    Past Medical History:  Diagnosis Date  . CAD (coronary artery disease)   . Hyperlipidemia   . Hypertension   . Pleural effusion 06/24/2019   LEFT    Past Surgical History:  Procedure Laterality Date  . APPLICATION OF WOUND VAC N/A 06/23/2019   Procedure: APPLICATION OF  WOUND VAC;  Surgeon: Linden DolinAtkins, Broadus Z, MD;  Location: [facility] OR;  Service: Thoracic;  Laterality: N/A;  . CORONARY ARTERY BYPASS GRAFT N/A 06/10/2019   Procedure: CORONARY ARTERY BYPASS GRAFTING (CABG) x Four, using left internal mammary artery and right leg greater saphenous vein harvested endoscopically;  Surgeon: Linden DolinAtkins, Broadus Z, MD;  Location: [facility] OR;  Service: Open Heart Surgery;  Laterality: N/A;  . LEFT HEART CATH AND CORONARY ANGIOGRAPHY N/A 06/09/2019   Procedure: LEFT HEART CATH AND CORONARY ANGIOGRAPHY;  Surgeon: Lennette BihariKelly, Thomas A, MD;  Location: [facility] INVASIVE CV LAB;  Service: Cardiovascular;  Laterality: N/A;  . STERNAL WOUND DEBRIDEMENT N/A 06/23/2019   Procedure: STERNAL WOUND DEBRIDEMENT;  Surgeon: Linden DolinAtkins, Broadus Z, MD;  Location: [facility] OR;  Service: Thoracic;  Laterality: N/A;  . TEE WITHOUT CARDIOVERSION N/A 06/10/2019   Procedure: TRANSESOPHAGEAL ECHOCARDIOGRAM (TEE);  Surgeon: Linden DolinAtkins, Broadus Z, MD;  [facility];  Service: Open Heart Surgery;  Laterality: N/A;     reports that she has never smoked. She has never used smokeless tobacco. She reports that she does not drink alcohol and does not use drugs.  Allergies  Allergen Reactions  . Carafate [Sucralfate] Swelling and Other (See Comments)    Patient stated it made her face swell (angioedema)- no breathing issues, however  . Lisinopril Swelling and Other (See Comments)    Patient stated it made her face swell (angioedema)- no breathing issues, however    Family History  Problem Relation Age of  Onset  . Hypertension Mother      Prior to Admission medications   Medication Sig Start Date End Date Taking? Authorizing Provider  acetaminophen (TYLENOL) 325 MG tablet Take 650 mg by mouth every 6 (six) hours as needed.    [provider]  Alirocumab (PRALUENT) 75 MG/ML SOAJ Inject 1 pen into the skin every 14 (fourteen) days. 09/09/19   Quintella Reichert, MD  aspirin EC 81 MG tablet Take 1 tablet (81 mg total) by mouth daily.  02/23/20   Dyann Kief, PA-C  docusate sodium (COLACE) 100 MG capsule Take 1 capsule (100 mg total) by mouth 2 (two) times daily as needed for mild constipation. 07/02/19   Sharlene Dory, PA-C  furosemide (LASIX) 20 MG tablet Take 1 tablet (20 mg total) by mouth daily. 02/23/20   Dyann Kief, PA-C  metoprolol tartrate (LOPRESSOR) 25 MG tablet Take 25 mg by mouth 2 (two) times daily.    [provider]    Physical Exam: Vitals:   09/04/20 1830 09/04/20 1845 09/04/20 1900 09/04/20 1915  BP:   (!) 168/95 (!) 163/88  Pulse: 97 88 88 91  Resp: 16 12 15 17   Temp:      TempSrc:      SpO2: 100% 99% 99% 100%      Constitutional: Non-English-speaking, weak Vitals:   09/04/20 1830 09/04/20 1845 09/04/20 1900 09/04/20 1915  BP:   (!) 168/95 (!) 163/88  Pulse: 97 88 88 91  Resp: 16 12 15 17   Temp:      TempSrc:      SpO2: 100% 99% 99% 100%   Eyes: PERRL, lids and conjunctivae normal ENMT: Mucous membranes are dry. Posterior pharynx clear of any exudate or lesions.Normal dentition.  Neck: normal, supple, no masses, no thyromegaly Respiratory: clear to auscultation bilaterally, no wheezing, no crackles. Normal respiratory effort. No accessory muscle use.  Cardiovascular: Sinus tachycardia no murmurs / rubs / gallops. No extremity edema. 2+ pedal pulses. No carotid bruits.  Abdomen: no tenderness, no masses palpated. No hepatosplenomegaly. Bowel sounds positive.  Musculoskeletal: no clubbing / cyanosis. No joint deformity upper and lower extremities. Good ROM, no contractures. Normal muscle tone.  Skin: Dry coarse skin no rashes, lesions, ulcers. No induration Neurologic: CN 2-12 grossly intact. Sensation intact, DTR normal. Strength 5/5 in all 4.  Psychiatric:  Alert and oriented x 3. Normal mood.     Labs on Admission: I have personally reviewed following labs and imaging studies  CBC: Recent Labs  Lab 09/04/20 1747 09/04/20 1805  WBC 7.5  --   HGB 15.1* 16.3*    HCT 48.0* 48.0*  MCV 87.4  --   PLT 233  --    Basic Metabolic Panel: Recent Labs  Lab 09/04/20 1747 09/04/20 1805  NA 138 139  K 5.6* 5.6*  CL 102  --   CO2 21*  --   GLUCOSE 714*  --   BUN 24*  --   CREATININE 1.36*  --   CALCIUM 9.9  --    GFR: CrCl cannot be calculated (Unknown ideal weight.). Liver Function Tests: Recent Labs  Lab 09/04/20 1747  AST 14*  ALT 21  ALKPHOS 75  BILITOT 0.8  PROT 7.8  ALBUMIN 3.9   No results for input(s): LIPASE, AMYLASE in the last 168 hours. No results for input(s): AMMONIA in the last 168 hours. Coagulation Profile: No results for input(s): INR, PROTIME in the last 168 hours. Cardiac Enzymes: Recent Labs  Lab 09/04/20 1747  CKTOTAL 59   BNP (last 3 results) No results for input(s): PROBNP in the last 8760 hours. HbA1C: No results for input(s): HGBA1C in the last 72 hours. CBG: Recent Labs  Lab 09/04/20 1741 09/04/20 1836  GLUCAP >600* 583*   Lipid Profile: No results for input(s): CHOL, HDL, LDLCALC, TRIG, CHOLHDL, LDLDIRECT in the last 72 hours. Thyroid Function Tests: No results for input(s): TSH, T4TOTAL, FREET4, T3FREE, THYROIDAB in the last 72 hours. Anemia Panel: No results for input(s): VITAMINB12, FOLATE, FERRITIN, TIBC, IRON, RETICCTPCT in the last 72 hours. Urine analysis:    Component Value Date/Time   COLORURINE COLORLESS (A) 09/04/2020 2020   APPEARANCEUR CLEAR 09/04/2020 2020   LABSPEC 1.015 09/04/2020 2020   PHURINE 5.0 09/04/2020 2020   GLUCOSEU >=500 (A) 09/04/2020 2020   HGBUR NEGATIVE 09/04/2020 2020   BILIRUBINUR NEGATIVE 09/04/2020 2020   KETONESUR 20 (A) 09/04/2020 2020   PROTEINUR NEGATIVE 09/04/2020 2020   NITRITE NEGATIVE 09/04/2020 2020   LEUKOCYTESUR NEGATIVE 09/04/2020 2020   Sepsis Labs: @LABRCNTIP (procalcitonin:4,lacticidven:4) )No results found for this or any previous visit (from the past 240 hour(s)).   Radiological Exams on Admission: CT Head Wo Contrast  Result  Date: 09/04/2020 CLINICAL DATA:  Right arm pain and weakness. EXAM: CT HEAD WITHOUT CONTRAST TECHNIQUE: Contiguous axial images were obtained from the base of the skull through the vertex without intravenous contrast. COMPARISON:  None. FINDINGS: Brain: There is mild cerebral atrophy with widening of the extra-axial spaces and ventricular dilatation. There are areas of decreased attenuation within the white matter tracts of the supratentorial brain, consistent with microvascular disease changes. Normal basal ganglia. Normal bilateral thalami. Vascular: No hyperdense vessel or unexpected calcification. Skull: Normal. Negative for fracture or focal lesion. Sinuses/Orbits: No acute finding. Other: None. IMPRESSION: 1. Mild cerebral atrophy and microvascular disease changes of the supratentorial brain. 2. No acute intracranial abnormality. Electronically Signed   By: 09/06/2020 M.D.   On: 09/04/2020 19:49   DG Chest Port 1 View  Result Date: 09/04/2020 CLINICAL DATA:  Weakness. EXAM: PORTABLE CHEST 1 VIEW COMPARISON:  08/21/2019 FINDINGS: Median sternotomy and CABG. Heart is enlarged and stable in configuration. There are no focal consolidations. No pleural effusions or pulmonary edema. IMPRESSION: Cardiomegaly. Electronically Signed   By: 08/23/2019 M.D.   On: 09/04/2020 18:45    EKG: Independently reviewed.  It shows sinus rhythm with voltage criteria for LVH LAE as well as borderline QT interval prolongation.  Does not appear to be new.  Assessment/Plan Principal Problem:   Hyperglycemia due to diabetes mellitus (HCC) Active Problems:   CAD (coronary artery disease)   HTN (hypertension)   GERD (gastroesophageal reflux disease)   Hyperlipidemia   S/P CABG x 4   Hyperkalemia     #1 hyperosmolar hyperglycemia: Patient most likely newly diagnosed diabetic.  No prior diagnosis.  Previous A1c 5.9.  We will admit the patient.  Initiate insulin drip non-DKA.  We will titrate to  subcutaneous insulin once patient's blood sugar stabilizes.  IV fluids resuscitation.  Check hemoglobin A1c.  Education on diabetes with nutrition.  We will determine home regimen at time of discharge.  #2 coronary artery disease: Status post bypass grafting.  Continue close monitoring.  #3 hypertension: Continue blood pressure control  #4 hyperlipidemia: Resume home regimen  #5 hyperkalemia: With the insulin potassium should correct.  #6 GERD: Continue with PPIs   DVT prophylaxis: Lovenox Code Status: Full code Family Communication: No family at bedside Disposition  Plan: Home Consults called: None Admission status: Inpatient  Severity of Illness: The appropriate patient status for this patient is INPATIENT. Inpatient status is judged to be reasonable and necessary in order to provide the required intensity of service to ensure the patient's safety. The patient's presenting symptoms, physical exam findings, and initial radiographic and laboratory data in the context of their chronic comorbidities is felt to place them at high risk for further clinical deterioration. Furthermore, it is not anticipated that the patient will be medically stable for discharge from the hospital within 2 midnights of admission. The following factors support the patient status of inpatient.   " The patient's presenting symptoms include weakness and no specific complaints. " The worrisome physical exam findings include dry mucous membranes. " The initial radiographic and laboratory data are worrisome because of glucose more than 700. " The chronic co-morbidities include coronary artery disease.   * I certify that at the point of admission it is my clinical judgment that the patient will require inpatient hospital care spanning beyond 2 midnights from the point of admission due to high intensity of service, high risk for further deterioration and high frequency of surveillance required.Lonia Blood  MD [facility] Pager [phone]  If 7PM-7AM, please contact night-coverage [email or web address] Password [facility]  09/04/2020, 9:53 PM

## 2020-09-04 NOTE — ED Triage Notes (Signed)
Pt BIB [facility] from home, c/o right arm pain/spasms x 1 day. Denies fall/injury. Pt diabetic, not taking medications. CBG >600. Denies nausea/vomiting.

## 2020-09-05 LAB — BASIC METABOLIC PANEL
Anion gap: 11 (ref 5–15)
Anion gap: 10 (ref 5–15)
Anion gap: 9 (ref 5–15)
Anion gap: 9 (ref 5–15)
BUN: 12 mg/dL (ref 8–23)
BUN: 10 mg/dL (ref 8–23)
BUN: 8 mg/dL (ref 8–23)
BUN: 11 mg/dL (ref 8–23)
CO2: 25 mmol/L (ref 22–32)
CO2: 26 mmol/L (ref 22–32)
CO2: 28 mmol/L (ref 22–32)
CO2: 29 mmol/L (ref 22–32)
Calcium: 8.5 mg/dL — ABNORMAL LOW (ref 8.9–10.3)
Calcium: 8.3 mg/dL — ABNORMAL LOW (ref 8.9–10.3)
Calcium: 8.7 mg/dL — ABNORMAL LOW (ref 8.9–10.3)
Calcium: 8.8 mg/dL — ABNORMAL LOW (ref 8.9–10.3)
Chloride: 108 mmol/L (ref 98–111)
Chloride: 105 mmol/L (ref 98–111)
Chloride: 104 mmol/L (ref 98–111)
Chloride: 98 mmol/L (ref 98–111)
Creatinine, Ser: 0.78 mg/dL (ref 0.44–1.00)
Creatinine, Ser: 0.87 mg/dL (ref 0.44–1.00)
Creatinine, Ser: 0.81 mg/dL (ref 0.44–1.00)
Creatinine, Ser: 1.17 mg/dL — ABNORMAL HIGH (ref 0.44–1.00)
GFR, Estimated: >60 mL/min (ref >60)
GFR, Estimated: >60 mL/min (ref >60)
GFR, Estimated: >60 mL/min (ref >60)
GFR, Estimated: 52 mL/min — ABNORMAL LOW (ref >60)
Glucose, Bld: 211 mg/dL — ABNORMAL HIGH (ref 70–99)
Glucose, Bld: 270 mg/dL — ABNORMAL HIGH (ref 70–99)
Glucose, Bld: 252 mg/dL — ABNORMAL HIGH (ref 70–99)
Glucose, Bld: 445 mg/dL — ABNORMAL HIGH (ref 70–99)
Potassium: 2.8 mmol/L — ABNORMAL LOW (ref 3.5–5.1)
Potassium: 3.6 mmol/L (ref 3.5–5.1)
Potassium: 2.9 mmol/L — ABNORMAL LOW (ref 3.5–5.1)
Potassium: 3.5 mmol/L (ref 3.5–5.1)
Sodium: 144 mmol/L (ref 135–145)
Sodium: 141 mmol/L (ref 135–145)
Sodium: 141 mmol/L (ref 135–145)
Sodium: 136 mmol/L (ref 135–145)

## 2020-09-05 LAB — CBG MONITORING, ED
Glucose-Capillary: 189 mg/dL — ABNORMAL HIGH (ref 70–99)
Glucose-Capillary: 191 mg/dL — ABNORMAL HIGH (ref 70–99)
Glucose-Capillary: 191 mg/dL — ABNORMAL HIGH (ref 70–99)
Glucose-Capillary: 192 mg/dL — ABNORMAL HIGH (ref 70–99)
Glucose-Capillary: 202 mg/dL — ABNORMAL HIGH (ref 70–99)
Glucose-Capillary: 204 mg/dL — ABNORMAL HIGH (ref 70–99)
Glucose-Capillary: 205 mg/dL — ABNORMAL HIGH (ref 70–99)
Glucose-Capillary: 205 mg/dL — ABNORMAL HIGH (ref 70–99)
Glucose-Capillary: 219 mg/dL — ABNORMAL HIGH (ref 70–99)
Glucose-Capillary: 234 mg/dL — ABNORMAL HIGH (ref 70–99)
Glucose-Capillary: 234 mg/dL — ABNORMAL HIGH (ref 70–99)
Glucose-Capillary: 256 mg/dL — ABNORMAL HIGH (ref 70–99)
Glucose-Capillary: 274 mg/dL — ABNORMAL HIGH (ref 70–99)
Glucose-Capillary: 290 mg/dL — ABNORMAL HIGH (ref 70–99)

## 2020-09-05 LAB — GLUCOSE, CAPILLARY
Glucose-Capillary: 445 mg/dL — ABNORMAL HIGH (ref 70–99)
Glucose-Capillary: 411 mg/dL — ABNORMAL HIGH (ref 70–99)

## 2020-09-05 LAB — MAGNESIUM: Magnesium: 1.6 mg/dL — ABNORMAL LOW (ref 1.7–2.4)

## 2020-09-05 LAB — HIV ANTIBODY (ROUTINE TESTING W REFLEX): HIV Screen 4th Generation wRfx: NONREACTIVE

## 2020-09-05 MED ORDER — INSULIN STARTER KIT- SYRINGES (ENGLISH)
1.0000 | Freq: Once | Status: AC
  Administered 2020-09-05: 1
  Filled 2020-09-05: qty 1

## 2020-09-05 MED ORDER — LIVING WELL WITH DIABETES BOOK
Freq: Once | Status: AC
  Filled 2020-09-05: qty 1

## 2020-09-05 MED ORDER — INSULIN ASPART 100 UNIT/ML ~~LOC~~ SOLN
10.0000 [IU] | Freq: Once | SUBCUTANEOUS | Status: AC
  Administered 2020-09-05: 10 [IU] via SUBCUTANEOUS

## 2020-09-05 MED ORDER — INSULIN ASPART 100 UNIT/ML ~~LOC~~ SOLN
0.0000 [IU] | SUBCUTANEOUS | Status: DC
  Administered 2020-09-05: 7 [IU] via SUBCUTANEOUS
  Administered 2020-09-05: 11 [IU] via SUBCUTANEOUS
  Administered 2020-09-06: 15 [IU] via SUBCUTANEOUS
  Administered 2020-09-06: 4 [IU] via SUBCUTANEOUS
  Administered 2020-09-06: 15 [IU] via SUBCUTANEOUS

## 2020-09-05 MED ORDER — POTASSIUM CHLORIDE 20 MEQ PO PACK
40.0000 meq | PACK | Freq: Two times a day (BID) | ORAL | Status: AC
  Administered 2020-09-05 – 2020-09-06 (×2): 40 meq via ORAL
  Filled 2020-09-05 (×2): qty 2

## 2020-09-05 MED ORDER — ASPIRIN EC 81 MG PO TBEC
81.0000 mg | DELAYED_RELEASE_TABLET | Freq: Every day | ORAL | Status: DC
  Administered 2020-09-05 – 2020-09-07 (×3): 81 mg via ORAL
  Filled 2020-09-05 (×3): qty 1

## 2020-09-05 MED ORDER — AMLODIPINE BESYLATE 5 MG PO TABS
5.0000 mg | ORAL_TABLET | Freq: Every day | ORAL | Status: DC
  Administered 2020-09-05 – 2020-09-07 (×3): 5 mg via ORAL
  Filled 2020-09-05 (×3): qty 1

## 2020-09-05 MED ORDER — DOCUSATE SODIUM 100 MG PO CAPS
100.0000 mg | ORAL_CAPSULE | Freq: Every day | ORAL | Status: DC
  Administered 2020-09-05 – 2020-09-07 (×3): 100 mg via ORAL
  Filled 2020-09-05 (×3): qty 1

## 2020-09-05 MED ORDER — INSULIN GLARGINE 100 UNIT/ML ~~LOC~~ SOLN
20.0000 [IU] | Freq: Every day | SUBCUTANEOUS | Status: DC
  Administered 2020-09-05 – 2020-09-06 (×2): 20 [IU] via SUBCUTANEOUS
  Filled 2020-09-05 (×2): qty 0.2

## 2020-09-05 NOTE — ED Notes (Signed)
Spoke with diabetes coordinator. Carollee Herter, diabetes coordinator, would like to speak to family when they arrive for the patient.

## 2020-09-05 NOTE — Progress Notes (Signed)
Pt stated by translator twice that pt Birthdate is 02/16/1967 in pt. chart Birthdate is Nov 03, 1956. I attempted to contact daughter but was unable to get a answer will continue to try.ED adsmission was called and stated that on her [place] states issued employment authentication card her birthday is 1956-10-03 and that's the date the system has to go by.    Everlean Cherry, RN

## 2020-09-05 NOTE — Progress Notes (Signed)
Inpatient Diabetes Program Recommendations  AACE/ADA: New Consensus Statement on Inpatient Glycemic Control (2015)  Target Ranges:  Prepandial:   less than 140 mg/dL      Peak postprandial:   less than 180 mg/dL (1-2 hours)      Critically ill patients:  140 - 180 mg/dL   Lab Results  Component Value Date   GLUCAP 204 (H) 09/05/2020   HGBA1C 5.9 (H) 06/09/2019    Review of Glycemic Control  Diabetes history: New Diabetes Diagnosis   Current orders for Inpatient glycemic control: IV insulin transitioning to Lantus 20 units, Novolog 0-20 units Q4 hours  Waiting on current A1c, pre diabetes with 5.9% on 06/09/2019 Speaks [place], NOS No insurance Will need [facility] follow up and resources  Will see pt today. Will need extensive education.  Thanks,  Christena Deem RN, MSN, BC-ADM Inpatient Diabetes Coordinator Team Pager [phone] (8a-5p)

## 2020-09-05 NOTE — Discharge Instructions (Signed)
Check your sugar by fingerstick 3 times a day before meals Give insulin before morning meal and before evening meal.

## 2020-09-05 NOTE — Progress Notes (Addendum)
Inpatient Diabetes Program Recommendations  AACE/ADA: New Consensus Statement on Inpatient Glycemic Control (2015)  Target Ranges:  Prepandial:   less than 140 mg/dL      Peak postprandial:   less than 180 mg/dL (1-2 hours)      Critically ill patients:  140 - 180 mg/dL   Lab Results  Component Value Date   GLUCAP 205 (H) 09/05/2020   HGBA1C 5.9 (H) 06/09/2019    Spoke with pt and daughter regarding diabetes care at home. Used interpreter services with patient between 11-12 pm today and came back later when daughter was here to show and discuss insulin as pt can't read and understand English numbers (verified by daughter).  Discussed self care such as checking CBGs (three times a day before meals), taking insulin vial vial and syringe and insulin pen, goal glucose levels and follow up at [facility] in order to get their insulins.  Daughter requests information to be given in [place].  They desire insulin vial and syringe at time of d/c if cost is cheaper.   TOC consult placed for medication assistance ad PCP.  Insulin syringe kit ordered, living well with Diabetes educational booklet ordered for daughter.  May benefit from 70/30 18-20 units bid before meals  D/C: Glucose meter kit order # [id number] Syringe order # [id number] Novolog 70/30 mix insulin order # [id number]  Thanks,  Tama Headings RN, MSN, BC-ADM Inpatient Diabetes Coordinator Team Pager [phone] (8a-5p)

## 2020-09-05 NOTE — Progress Notes (Signed)
PROGRESS NOTE    Alberteen SamMarie C Buford  ZOX:[id number] DOB: 07/13/1956 DOA: 09/04/2020 PCP: Cain SaupeFulp, Cammie, MD   Brief Narrative:  Patient is [age] year old female with past medical history of coronary artery disease status post CABG, hypertension, hyperlipidemia, obese, GERD presents to emergency department with generalized weakness, polyuria, malaise.  Upon arrival to ED: Patient afebrile, blood pressure: 160/95, HR: 106, respiratory rate: 20, oxygen saturation 99% on room air, no leukocytosis, sodium: 139, potassium: 3.3, chloride: 102, CO2: 21, BUN: 24, blood glucose: 714, anion gap: 15.  Patient was noted to be confused.  Concern for HHS.  Patient started on insulin GTT in ED and admitted for further evaluation and management.  Assessment & Plan:  HHS: -Likely in the setting of newly diagnosed diabetes.  Previous A1c: 5.9. -Blood glucose: 714, anion gap: 15, CO2: 21, potassium: 5.6.  Patient started on IV fluids and insulin GTT in the ED.  A1c is pending.  Osmolality: 348-elevated. -CT head showed no acute findings.  Chest x-ray shows cardiomegaly.  No pneumonia.  UA positive for rare bacteria.  COVID-19 negative. -Blood sugar - improved.  Transition to sliding scale insulin, CBG every 4 hours and start patient on Lantus 20 units daily. -Monitor blood sugar closely -Consult diabetes coordinator-appreciate help.  Hyperkalemia: Potassium 5.6 upon arrival.  Now 2.9.  Will replenish.  Check magnesium level.  Coronary artery disease status post CABG in 2020: -Patient denies ACS symptoms.  Initial troponin: 38 trended down to 28 28 -Continue aspirin.  Reviewed home meds.  Not on beta-blocker or statin  Asymptomatic bacteriuria: Patient's UA is positive for rare bacteria.  Patient denies any urinary symptoms.  No indication of antibiotics at this time.  Hypertension: Stable -Continue amlodipine.  Continue to monitor blood pressure closely.  Chronic diastolic CHF: Patient appears dry on exam.  Hold  Lasix for now.  Continue IV hydration.  Strict INO's and daily weight.  Monitor signs for fluid overload.  Obesity: -Diet modification/exercise and weight loss recommended.  Chronic constipation: Continue Colace  Anticipate discharge tomorrow AM.  DVT prophylaxis: SCD/Lovenox  code Status: Full code Family Communication: None present at bedside.  Plan of care discussed with patient in length and she verbalized understanding and agreed with it. Disposition Plan: Likely home 09/06/2020  Consultants:   None  Procedures:   None  Antimicrobials:   None  Status is: Inpatient   Dispo: The patient is from: Home              Anticipated d/c is to: Home              Anticipated d/c date is: 09/06/20              Patient currently not medically stable for the discharge    Subjective: Patient seen and examined.  Used [place] interpreter for the history.  Tells me that she came to the hospital due to right arm pain, generalized weakness and polyuria.  She feels better now.  Denies chest pain, shortness of breath, palpitation, leg swelling, fever, chills, urinary symptoms such as dysuria, hematuria, abdominal pain, bowel changes.  Objective: Vitals:   09/05/20 1325 09/05/20 1330 09/05/20 1400 09/05/20 1519  BP: (!) 136/97 (!) 149/83 (!) 141/71 128/62  Pulse: 85 82 71 90  Resp: 15 17 12  (!) 21  Temp:      TempSrc:      SpO2: 97% 100% 99% 96%    Intake/Output Summary (Last 24 hours) at 09/05/2020 1544 Last data filed at  09/05/2020 0050 Gross per 24 hour  Intake 4800 ml  Output --  Net 4800 ml   There were no vitals filed for this visit.  Examination:  General exam: Appears calm and comfortable, on room air, obese, appears dehydrated  Respiratory system: Clear to auscultation. Respiratory effort normal. Cardiovascular system: S1 & S2 heard, RRR. No JVD, murmurs, rubs, gallops or clicks. No pedal edema. Gastrointestinal system: Abdomen is nondistended, soft and nontender. No  organomegaly or masses felt. Normal bowel sounds heard. Central nervous system: Alert and and communicating well however does not know what year it is and which hospital she is at Extremities: Symmetric 5 x 5 power. Skin: No rashes, lesions or ulcers Psychiatry: Judgement and insight appear normal. Mood & affect appropriate.    Data Reviewed: I have personally reviewed following labs and imaging studies  CBC: Recent Labs  Lab 09/04/20 1747 09/04/20 1805 09/04/20 2243  WBC 7.5  --  6.8  HGB 15.1* 16.3* 14.0  HCT 48.0* 48.0* 44.7  MCV 87.4  --  86.8  PLT 233  --  222   Basic Metabolic Panel: Recent Labs  Lab 09/04/20 1747 09/04/20 1747 09/04/20 1805 09/04/20 2236 09/05/20 0338 09/05/20 0724 09/05/20 1022  NA 138   < > 139 145 144 141 141  K 5.6*   < > 5.6* 3.3* 2.8* 3.6 2.9*  CL 102  --   --  112* 108 105 104  CO2 21*  --   --  22 25 26 28   GLUCOSE 714*  --   --  400* 211* 270* 252*  BUN 24*  --   --  17 12 10 8   CREATININE 1.36*  --   --  0.89 0.78 0.87 0.81  CALCIUM 9.9  --   --  8.3* 8.5* 8.3* 8.7*   < > = values in this interval not displayed.   GFR: CrCl cannot be calculated (Unknown ideal weight.). Liver Function Tests: Recent Labs  Lab 09/04/20 1747  AST 14*  ALT 21  ALKPHOS 75  BILITOT 0.8  PROT 7.8  ALBUMIN 3.9   No results for input(s): LIPASE, AMYLASE in the last 168 hours. No results for input(s): AMMONIA in the last 168 hours. Coagulation Profile: No results for input(s): INR, PROTIME in the last 168 hours. Cardiac Enzymes: Recent Labs  Lab 09/04/20 1747  CKTOTAL 59   BNP (last 3 results) No results for input(s): PROBNP in the last 8760 hours. HbA1C: No results for input(s): HGBA1C in the last 72 hours. CBG: Recent Labs  Lab 09/05/20 0813 09/05/20 0917 09/05/20 1021 09/05/20 1056 09/05/20 1137  GLUCAP 204* 234* 256* 234* 205*   Lipid Profile: No results for input(s): CHOL, HDL, LDLCALC, TRIG, CHOLHDL, LDLDIRECT in the last 72  hours. Thyroid Function Tests: No results for input(s): TSH, T4TOTAL, FREET4, T3FREE, THYROIDAB in the last 72 hours. Anemia Panel: No results for input(s): VITAMINB12, FOLATE, FERRITIN, TIBC, IRON, RETICCTPCT in the last 72 hours. Sepsis Labs: No results for input(s): PROCALCITON, LATICACIDVEN in the last 168 hours.  Recent Results (from the past 240 hour(s))  Respiratory Panel by RT PCR (Flu A&B, Covid) - Nasopharyngeal Swab     Status: None   Collection Time: 09/04/20  7:08 PM   Specimen: Nasopharyngeal Swab  Result Value Ref Range Status   SARS Coronavirus 2 by RT PCR NEGATIVE NEGATIVE Final    Comment: (NOTE) SARS-CoV-2 target nucleic acids are NOT DETECTED.  The SARS-CoV-2 RNA is generally detectable in upper  respiratoy specimens during the acute phase of infection. The lowest concentration of SARS-CoV-2 viral copies this assay can detect is 131 copies/mL. A negative result does not preclude SARS-Cov-2 infection and should not be used as the sole basis for treatment or other patient management decisions. A negative result may occur with  improper specimen collection/handling, submission of specimen other than nasopharyngeal swab, presence of viral mutation(s) within the areas targeted by this assay, and inadequate number of viral copies (<131 copies/mL). A negative result must be combined with clinical observations, patient history, and epidemiological information. The expected result is Negative.  Fact Sheet for Patients:  https://www.moore.com/  Fact Sheet for Healthcare Providers:  https://www.young.biz/  This test is no t yet approved or cleared by the [place] FDA and  has been authorized for detection and/or diagnosis of SARS-CoV-2 by FDA under an Emergency Use Authorization (EUA). This EUA will remain  in effect (meaning this test can be used) for the duration of the COVID-19 declaration under Section 564(b)(1) of the  Act, 21 U.S.C. section 360bbb-3(b)(1), unless the authorization is terminated or revoked sooner.     Influenza A by PCR NEGATIVE NEGATIVE Final   Influenza B by PCR NEGATIVE NEGATIVE Final    Comment: (NOTE) The Xpert Xpress SARS-CoV-2/FLU/RSV assay is intended as an aid in  the diagnosis of influenza from Nasopharyngeal swab specimens and  should not be used as a sole basis for treatment. Nasal washings and  aspirates are unacceptable for Xpert Xpress SARS-CoV-2/FLU/RSV  testing.  Fact Sheet for Patients: https://www.moore.com/  Fact Sheet for Healthcare Providers: https://www.young.biz/  This test is not yet approved or cleared by the [place] FDA and  has been authorized for detection and/or diagnosis of SARS-CoV-2 by  FDA under an Emergency Use Authorization (EUA). This EUA will remain  in effect (meaning this test can be used) for the duration of the  Covid-19 declaration under Section 564(b)(1) of the Act, 21  U.S.C. section 360bbb-3(b)(1), unless the authorization is  terminated or revoked. Performed at [facility], [address], [place], [place] [address]       Radiology Studies: CT Head Wo Contrast  Result Date: 09/04/2020 CLINICAL DATA:  Right arm pain and weakness. EXAM: CT HEAD WITHOUT CONTRAST TECHNIQUE: Contiguous axial images were obtained from the base of the skull through the vertex without intravenous contrast. COMPARISON:  None. FINDINGS: Brain: There is mild cerebral atrophy with widening of the extra-axial spaces and ventricular dilatation. There are areas of decreased attenuation within the white matter tracts of the supratentorial brain, consistent with microvascular disease changes. Normal basal ganglia. Normal bilateral thalami. Vascular: No hyperdense vessel or unexpected calcification. Skull: Normal. Negative for fracture or focal lesion. Sinuses/Orbits: No acute finding. Other: None. IMPRESSION: 1.  Mild cerebral atrophy and microvascular disease changes of the supratentorial brain. 2. No acute intracranial abnormality. Electronically Signed   By: Aram Candela M.D.   On: 09/04/2020 19:49   DG Chest Port 1 View  Result Date: 09/04/2020 CLINICAL DATA:  Weakness. EXAM: PORTABLE CHEST 1 VIEW COMPARISON:  08/21/2019 FINDINGS: Median sternotomy and CABG. Heart is enlarged and stable in configuration. There are no focal consolidations. No pleural effusions or pulmonary edema. IMPRESSION: Cardiomegaly. Electronically Signed   By: Norva Pavlov M.D.   On: 09/04/2020 18:45    Scheduled Meds: . enoxaparin (LOVENOX) injection  40 mg Subcutaneous Q24H  . insulin aspart  0-20 Units Subcutaneous Q4H  . insulin glargine  20 Units Subcutaneous Daily  Continuous Infusions: . dextrose 5% lactated ringers Stopped (09/05/20 1237)  . insulin Stopped (09/05/20 1100)  . lactated ringers    . lactated ringers       LOS: 1 day   Time spent: 40 minutes   Shiquita Collignon Estill Cotta, MD [facility]  If 7PM-7AM, please contact night-coverage [email or web address] 09/05/2020, 3:44 PM

## 2020-09-05 NOTE — Progress Notes (Addendum)
Pt arrived from unit from ED hyperglycemia .HR 95 SPo2 99 RA BP 145/111 (119) .Will continue to monitor. Pt. Oriented to unit. Pt has bottom dentures .   Everlean Cherry, RN

## 2020-09-05 NOTE — Progress Notes (Signed)
Pts daughter, Kateryna Grantham, called back to verify that Pts birthday is October 29, 1956, the birthday that we have on file.  Willa Frater, RN

## 2020-09-06 LAB — HEMOGLOBIN A1C
Hgb A1c MFr Bld: >15.5 % — ABNORMAL HIGH (ref 4.8–5.6)
Mean Plasma Glucose: >398 mg/dL

## 2020-09-06 LAB — GLUCOSE, CAPILLARY
Glucose-Capillary: 173 mg/dL — ABNORMAL HIGH (ref 70–99)
Glucose-Capillary: 257 mg/dL — ABNORMAL HIGH (ref 70–99)
Glucose-Capillary: 300 mg/dL — ABNORMAL HIGH (ref 70–99)
Glucose-Capillary: 330 mg/dL — ABNORMAL HIGH (ref 70–99)
Glucose-Capillary: 333 mg/dL — ABNORMAL HIGH (ref 70–99)
Glucose-Capillary: 339 mg/dL — ABNORMAL HIGH (ref 70–99)
Glucose-Capillary: 87 mg/dL (ref 70–99)

## 2020-09-06 LAB — BASIC METABOLIC PANEL
Anion gap: 10 (ref 5–15)
BUN: 18 mg/dL (ref 8–23)
CO2: 25 mmol/L (ref 22–32)
Calcium: 8.7 mg/dL — ABNORMAL LOW (ref 8.9–10.3)
Chloride: 100 mmol/L (ref 98–111)
Creatinine, Ser: 1.15 mg/dL — ABNORMAL HIGH (ref 0.44–1.00)
GFR, Estimated: 54 mL/min — ABNORMAL LOW (ref >60)
Glucose, Bld: 364 mg/dL — ABNORMAL HIGH (ref 70–99)
Potassium: 3.7 mmol/L (ref 3.5–5.1)
Sodium: 135 mmol/L (ref 135–145)

## 2020-09-06 LAB — MAGNESIUM: Magnesium: 1.7 mg/dL (ref 1.7–2.4)

## 2020-09-06 MED ORDER — INSULIN ASPART PROT & ASPART (70-30 MIX) 100 UNIT/ML ~~LOC~~ SUSP
12.0000 [IU] | Freq: Once | SUBCUTANEOUS | Status: AC
  Administered 2020-09-06: 12 [IU] via SUBCUTANEOUS
  Filled 2020-09-06: qty 10

## 2020-09-06 MED ORDER — MAGNESIUM SULFATE 2 GM/50ML IV SOLN
2.0000 g | Freq: Once | INTRAVENOUS | Status: AC
  Administered 2020-09-06: 2 g via INTRAVENOUS
  Filled 2020-09-06: qty 50

## 2020-09-06 MED ORDER — INSULIN ASPART 100 UNIT/ML ~~LOC~~ SOLN
0.0000 [IU] | Freq: Every day | SUBCUTANEOUS | Status: DC
  Administered 2020-09-06: 4 [IU] via SUBCUTANEOUS

## 2020-09-06 MED ORDER — INSULIN ASPART PROT & ASPART (70-30 MIX) 100 UNIT/ML ~~LOC~~ SUSP
20.0000 [IU] | Freq: Two times a day (BID) | SUBCUTANEOUS | Status: DC
  Administered 2020-09-07: 20 [IU] via SUBCUTANEOUS
  Filled 2020-09-06: qty 10

## 2020-09-06 MED ORDER — INSULIN ASPART 100 UNIT/ML ~~LOC~~ SOLN
0.0000 [IU] | Freq: Three times a day (TID) | SUBCUTANEOUS | Status: DC
  Administered 2020-09-06: 8 [IU] via SUBCUTANEOUS
  Administered 2020-09-07: 3 [IU] via SUBCUTANEOUS
  Administered 2020-09-07: 4 [IU] via SUBCUTANEOUS

## 2020-09-06 MED ORDER — INSULIN ASPART 100 UNIT/ML ~~LOC~~ SOLN: 0.0000 [IU] | Freq: Three times a day (TID) | SUBCUTANEOUS | Status: DC

## 2020-09-06 NOTE — Progress Notes (Addendum)
Inpatient Diabetes Program Recommendations  AACE/ADA: New Consensus Statement on Inpatient Glycemic Control (2015)  Target Ranges:  Prepandial:   less than 140 mg/dL      Peak postprandial:   less than 180 mg/dL (1-2 hours)      Critically ill patients:  140 - 180 mg/dL   Lab Results  Component Value Date   GLUCAP 339 (H) 09/06/2020   HGBA1C >15.5 (H) 09/04/2020    Review of Glycemic Control Results for [name], [name] (MRN [id number]) as of 09/06/2020 13:42  Ref. Range 09/05/2020 20:30 09/06/2020 00:34 09/06/2020 04:36 09/06/2020 08:05 09/06/2020 12:18  Glucose-Capillary Latest Ref Range: 70 - 99 mg/dL 411 (H) 333 (H) 173 (H) 87 339 (H)   Current orders for Inpatient glycemic control:  Novolog 0-20 units q4h Lantus 20 units qhs  Inpatient Diabetes Program Recommendations:    Please consider Novolog 0-20 units tid with meals and 0-5 units qhs since patient is now eating.  Likely needs Novolog 4-5 units of meal coverage tid if eats at least 50% of meal as post prandials are elevated.   Addendum @ 6484: Please consider  -DC lantus 20 units qhs -DC Novolog 0-20 q4H -70/30 tonight 12 units at supper tonight -70/30 20 units bid with breakfast and supper starting on 09/07/20 (total 28 units basal) -add Novolog 0-15 units tid & 0-5 units qhs  D/C: Glucose meter kit order # [id number] Syringe order # [id number] Vial Novolog 70/30 mix insulin order # [id number]   Will continue to follow while inhspatient.  Thank you, Reche Dixon, RN, BSN Diabetes Coordinator Inpatient Diabetes Program [phone] (team pager from 8a-5p)

## 2020-09-06 NOTE — Progress Notes (Signed)
PROGRESS NOTE    LEIGHANA NEYMAN  STM:[id number] DOB: 08-24-1956 DOA: 09/04/2020 PCP: Cain Saupe, MD   Brief Narrative:  Patient is 64 year old female with past medical history of coronary artery disease status post CABG, hypertension, hyperlipidemia, obese, GERD presents to emergency department with generalized weakness, polyuria, malaise.  Upon arrival to ED: Patient afebrile, blood pressure: 160/95, HR: 106, respiratory rate: 20, oxygen saturation 99% on room air, no leukocytosis, sodium: 139, potassium: 3.3, chloride: 102, CO2: 21, BUN: 24, blood glucose: 714, anion gap: 15.  Patient was noted to be confused.  Concern for HHS.  Patient started on insulin GTT in ED and admitted for further evaluation and management.  Assessment & Plan:  HHS: Clinically patient is improving. -Likely in the setting of newly diagnosed diabetes.  Previous A1c: 5.9.  A1c on this admission is more than 15%. -Upon arrival: Blood glucose: 714, anion gap: 15, CO2: 21, potassium: 5.6.  Patient started on IV fluids and insulin GTT in the ED.  Osmolality: 348-elevated. -CT head showed no acute findings.  Chest x-ray shows cardiomegaly.  No pneumonia.  UA positive for rare bacteria.  COVID-19 negative. -Blood sugar - improved.  Transition to sliding scale insulin, CBG every 4 hours and start patient on Lantus 20 units daily. -Blood sugar labile-between 80s-300s -Discussed with diabetes coordinator: DC sliding scale insulin.  Start 70/30 tonight 12 units at supper.  Start 70/3020 units twice daily from tomorrow AM.  Add NovoLog 0 to 15 units 3 times daily and 0 to 5 units nightly. -We we will continue to monitor her blood sugar closely. -Anticipating discharge tomorrow AM.  Hyperkalemia: Potassium 5.6 upon arrival.  Now 2.9.  Replenished.  Repeat potassium level: WNL.  Coronary artery disease status post CABG in 2020: -Patient denies ACS symptoms.  Initial troponin: 38 trended down to 28 28 -Continue aspirin.   Reviewed home meds.  Not on beta-blocker or statin  Asymptomatic bacteriuria: Patient's UA is positive for rare bacteria.  Patient denies any urinary symptoms.  No indication of antibiotics at this time.  Hypertension: Stable -Continue amlodipine.  Continue to monitor blood pressure closely.  Chronic diastolic CHF: Patient appears dry on exam.  Hold Lasix for now.   Strict INO's and daily weight.  Monitor signs for fluid overload.  Obesity: -Diet modification/exercise and weight loss recommended.  Chronic constipation: Continue Colace  Anticipate discharge tomorrow AM.  DVT prophylaxis: SCD/Lovenox  code Status: Full code Family Communication: None present at bedside.  Plan of care discussed with patient in length and she verbalized understanding and agreed with it. Disposition Plan: Likely home 09/06/2020  Consultants:   None  Procedures:   None  Antimicrobials:   None  Status is: Inpatient   Dispo: The patient is from: Home              Anticipated d/c is to: Home              Anticipated d/c date is: 09/07/20              Patient currently not medically stable for the discharge    Subjective: Patient seen and examined.  Used [place] interpreter for the history.  Tells me that she is feeling better, her right arm pain has improved significantly.  She denies any chest pain, shortness of breath, weakness or dizziness. Objective: Vitals:   09/06/20 0031 09/06/20 0430 09/06/20 0800 09/06/20 1211  BP: 139/78 (!) 145/79 (!) 147/78 138/67  Pulse: 84 80 72 74  Resp: 15 15 14 19   Temp: (!) 97.5 F (36.4 C) 98.1 F (36.7 C) 97.6 F (36.4 C) 98 F (36.7 C)  TempSrc: Oral Oral Oral Oral  SpO2: 100% 100% 98% 99%    Intake/Output Summary (Last 24 hours) at 09/06/2020 1554 Last data filed at 09/06/2020 1500 Gross per 24 hour  Intake 2218.06 ml  Output --  Net 2218.06 ml   There were no vitals filed for this visit.  Examination: General exam: Appears calm and  comfortable, obese Respiratory system: Clear to auscultation. Respiratory effort normal. Cardiovascular system: S1 & S2 heard, RRR. No JVD, murmurs, rubs, gallops or clicks. No pedal edema. Gastrointestinal system: Abdomen is nondistended, soft and nontender. No organomegaly or masses felt. Normal bowel sounds heard. Central nervous system: Alert and oriented. No focal neurological deficits. Extremities: Symmetric 5 x 5 power. Skin: No rashes, lesions or ulcers. Psychiatry: Judgement and insight appear normal. Mood & affect appropriate.    Data Reviewed: I have personally reviewed following labs and imaging studies  CBC: Recent Labs  Lab 09/04/20 1747 09/04/20 1805 09/04/20 2243  WBC 7.5  --  6.8  HGB 15.1* 16.3* 14.0  HCT 48.0* 48.0* 44.7  MCV 87.4  --  86.8  PLT 233  --  222   Basic Metabolic Panel: Recent Labs  Lab 09/05/20 0338 09/05/20 0724 09/05/20 1022 09/05/20 1712 09/06/20 0132  NA 144 141 141 136 135  K 2.8* 3.6 2.9* 3.5 3.7  CL 108 105 104 98 100  CO2 25 26 28 29 25   GLUCOSE 211* 270* 252* 445* 364*  BUN 12 10 8 11 18   CREATININE 0.78 0.87 0.81 1.17* 1.15*  CALCIUM 8.5* 8.3* 8.7* 8.8* 8.7*  MG  --   --   --  1.6* 1.7   GFR: CrCl cannot be calculated (Unknown ideal weight.). Liver Function Tests: Recent Labs  Lab 09/04/20 1747  AST 14*  ALT 21  ALKPHOS 75  BILITOT 0.8  PROT 7.8  ALBUMIN 3.9   No results for input(s): LIPASE, AMYLASE in the last 168 hours. No results for input(s): AMMONIA in the last 168 hours. Coagulation Profile: No results for input(s): INR, PROTIME in the last 168 hours. Cardiac Enzymes: Recent Labs  Lab 09/04/20 1747  CKTOTAL 59   BNP (last 3 results) No results for input(s): PROBNP in the last 8760 hours. HbA1C: Recent Labs    09/04/20 2243  HGBA1C >15.5*   CBG: Recent Labs  Lab 09/05/20 2030 09/06/20 0034 09/06/20 0436 09/06/20 0805 09/06/20 1218  GLUCAP 411* 333* 173* 87 339*   Lipid Profile: No  results for input(s): CHOL, HDL, LDLCALC, TRIG, CHOLHDL, LDLDIRECT in the last 72 hours. Thyroid Function Tests: No results for input(s): TSH, T4TOTAL, FREET4, T3FREE, THYROIDAB in the last 72 hours. Anemia Panel: No results for input(s): VITAMINB12, FOLATE, FERRITIN, TIBC, IRON, RETICCTPCT in the last 72 hours. Sepsis Labs: No results for input(s): PROCALCITON, LATICACIDVEN in the last 168 hours.  Recent Results (from the past 240 hour(s))  Respiratory Panel by RT PCR (Flu A&B, Covid) - Nasopharyngeal Swab     Status: None   Collection Time: 09/04/20  7:08 PM   Specimen: Nasopharyngeal Swab  Result Value Ref Range Status   SARS Coronavirus 2 by RT PCR NEGATIVE NEGATIVE Final    Comment: (NOTE) SARS-CoV-2 target nucleic acids are NOT DETECTED.  The SARS-CoV-2 RNA is generally detectable in upper respiratoy specimens during the acute phase of infection. The lowest concentration of SARS-CoV-2 viral  copies this assay can detect is 131 copies/mL. A negative result does not preclude SARS-Cov-2 infection and should not be used as the sole basis for treatment or other patient management decisions. A negative result may occur with  improper specimen collection/handling, submission of specimen other than nasopharyngeal swab, presence of viral mutation(s) within the areas targeted by this assay, and inadequate number of viral copies (<131 copies/mL). A negative result must be combined with clinical observations, patient history, and epidemiological information. The expected result is Negative.  Fact Sheet for Patients:  https://www.moore.com/  Fact Sheet for Healthcare Providers:  https://www.young.biz/  This test is no t yet approved or cleared by the [place] FDA and  has been authorized for detection and/or diagnosis of SARS-CoV-2 by FDA under an Emergency Use Authorization (EUA). This EUA will remain  in effect (meaning this test can be  used) for the duration of the COVID-19 declaration under Section 564(b)(1) of the Act, 21 U.S.C. section 360bbb-3(b)(1), unless the authorization is terminated or revoked sooner.     Influenza A by PCR NEGATIVE NEGATIVE Final   Influenza B by PCR NEGATIVE NEGATIVE Final    Comment: (NOTE) The Xpert Xpress SARS-CoV-2/FLU/RSV assay is intended as an aid in  the diagnosis of influenza from Nasopharyngeal swab specimens and  should not be used as a sole basis for treatment. Nasal washings and  aspirates are unacceptable for Xpert Xpress SARS-CoV-2/FLU/RSV  testing.  Fact Sheet for Patients: https://www.moore.com/  Fact Sheet for Healthcare Providers: https://www.young.biz/  This test is not yet approved or cleared by the [place] FDA and  has been authorized for detection and/or diagnosis of SARS-CoV-2 by  FDA under an Emergency Use Authorization (EUA). This EUA will remain  in effect (meaning this test can be used) for the duration of the  Covid-19 declaration under Section 564(b)(1) of the Act, 21  U.S.C. section 360bbb-3(b)(1), unless the authorization is  terminated or revoked. Performed at [facility], [address], [place], [place] [address]       Radiology Studies: CT Head Wo Contrast  Result Date: 09/04/2020 CLINICAL DATA:  Right arm pain and weakness. EXAM: CT HEAD WITHOUT CONTRAST TECHNIQUE: Contiguous axial images were obtained from the base of the skull through the vertex without intravenous contrast. COMPARISON:  None. FINDINGS: Brain: There is mild cerebral atrophy with widening of the extra-axial spaces and ventricular dilatation. There are areas of decreased attenuation within the white matter tracts of the supratentorial brain, consistent with microvascular disease changes. Normal basal ganglia. Normal bilateral thalami. Vascular: No hyperdense vessel or unexpected calcification. Skull: Normal. Negative for  fracture or focal lesion. Sinuses/Orbits: No acute finding. Other: None. IMPRESSION: 1. Mild cerebral atrophy and microvascular disease changes of the supratentorial brain. 2. No acute intracranial abnormality. Electronically Signed   By: Aram Candela M.D.   On: 09/04/2020 19:49   DG Chest Port 1 View  Result Date: 09/04/2020 CLINICAL DATA:  Weakness. EXAM: PORTABLE CHEST 1 VIEW COMPARISON:  08/21/2019 FINDINGS: Median sternotomy and CABG. Heart is enlarged and stable in configuration. There are no focal consolidations. No pleural effusions or pulmonary edema. IMPRESSION: Cardiomegaly. Electronically Signed   By: Norva Pavlov M.D.   On: 09/04/2020 18:45    Scheduled Meds: . amLODipine  5 mg Oral Daily  . aspirin EC  81 mg Oral Daily  . docusate sodium  100 mg Oral Daily  . enoxaparin (LOVENOX) injection  40 mg Subcutaneous Q24H  . insulin aspart  0-20 Units  Subcutaneous Q4H  . insulin glargine  20 Units Subcutaneous Daily   Continuous Infusions: . dextrose 5% lactated ringers Stopped (09/05/20 1237)  . lactated ringers    . lactated ringers       LOS: 2 days   Time spent: 40 minutes   Daviyon Widmayer Estill Cotta, MD [facility]  If 7PM-7AM, please contact night-coverage [email or web address] 09/06/2020, 3:54 PM

## 2020-09-07 ENCOUNTER — Other Ambulatory Visit (HOSPITAL_COMMUNITY): Payer: Self-pay | Admitting: Internal Medicine

## 2020-09-07 LAB — BASIC METABOLIC PANEL
Anion gap: 11 (ref 5–15)
BUN: 15 mg/dL (ref 8–23)
CO2: 24 mmol/L (ref 22–32)
Calcium: 8.9 mg/dL (ref 8.9–10.3)
Chloride: 103 mmol/L (ref 98–111)
Creatinine, Ser: 0.86 mg/dL (ref 0.44–1.00)
GFR, Estimated: >60 mL/min (ref >60)
Glucose, Bld: 148 mg/dL — ABNORMAL HIGH (ref 70–99)
Potassium: 3.9 mmol/L (ref 3.5–5.1)
Sodium: 138 mmol/L (ref 135–145)

## 2020-09-07 LAB — GLUCOSE, CAPILLARY
Glucose-Capillary: 190 mg/dL — ABNORMAL HIGH (ref 70–99)
Glucose-Capillary: 191 mg/dL — ABNORMAL HIGH (ref 70–99)

## 2020-09-07 LAB — CBC
HCT: 40.2 % (ref 36.0–46.0)
Hemoglobin: 13.3 g/dL (ref 12.0–15.0)
MCH: 28.3 pg (ref 26.0–34.0)
MCHC: 33.1 g/dL (ref 30.0–36.0)
MCV: 85.5 fL (ref 80.0–100.0)
Platelets: 218 10*3/uL (ref 150–400)
RBC: 4.7 MIL/uL (ref 3.87–5.11)
RDW: 12.4 % (ref 11.5–15.5)
WBC: 6.3 10*3/uL (ref 4.0–10.5)
nRBC: 0 % (ref 0.0–0.2)

## 2020-09-07 LAB — HEMOGLOBIN A1C
Hgb A1c MFr Bld: 15.5 % — ABNORMAL HIGH (ref 4.8–5.6)
Mean Plasma Glucose: 398 mg/dL

## 2020-09-07 MED ORDER — INSULIN ASPART PROT & ASPART (70-30 MIX) 100 UNIT/ML ~~LOC~~ SUSP
20.0000 [IU] | Freq: Two times a day (BID) | SUBCUTANEOUS | 11 refills | Status: DC
Start: 2020-09-07 — End: 2020-11-14

## 2020-09-07 MED ORDER — PEN NEEDLES 32G X 4 MM MISC: 11 refills | Status: DC

## 2020-09-07 MED FILL — INSULIN ASP PROT & ASP FLEX: (70-30) 100 | 34 days supply | Qty: 15 | Fill #0

## 2020-09-07 MED FILL — PENTIPS 32G X 4 MM MISC: 32G X 4 MM | 25 days supply | Qty: 100 | Fill #0

## 2020-09-07 NOTE — Progress Notes (Signed)
Inpatient Diabetes Program Recommendations  AACE/ADA: New Consensus Statement on Inpatient Glycemic Control (2015)  Target Ranges:  Prepandial:   less than 140 mg/dL      Peak postprandial:   less than 180 mg/dL (1-2 hours)      Critically ill patients:  140 - 180 mg/dL   Lab Results  Component Value Date   GLUCAP 190 (H) 09/07/2020   HGBA1C >15.5 (H) 09/04/2020    Spoke with daughter Sherra on the phone.  Educated her on 70/30 and dose of 20 units with breakfast and dinner.  Provided patient with a glucometer and asked daughter to be sure she checks 4 times a day ac/hs.  She will have follow up in 1 week with PCP.  Will continue to follow while inpatient.  Thank you, Dulce Sellar, RN, BSN Diabetes Coordinator Inpatient Diabetes Program [phone] (team pager from 8a-5p)

## 2020-09-07 NOTE — Progress Notes (Signed)
Discharge instructions given to patient. IV removed, clean and intact. Medications reviewed, all questions answered. Pt escorted home with daughter.  [name] R Vaniah Chambers, RN  

## 2020-09-07 NOTE — TOC Transition Note (Signed)
Transition of Care (TOC) - CM/SW Discharge Note Donn Pierini RN, BSN Transitions of Care Unit 4E- RN Case Manager See Treatment Team for direct phone #    Patient Details  Name: Kimberly Bryant MRN: [id number] Date of Birth: 02-22-1956  Transition of Care [facility]) CM/SW Contact:  Darrold Span, RN Phone Number: 09/07/2020, 12:35 PM   Clinical Narrative:    Pt stable for transition home today, f/u appointment made for [facility], TOC to fill meds for home- pt has been placed into [facility] system for assistance with insulin. To return  Home with daughter.    Final next level of care: Home/Self Care Barriers to Discharge: No Barriers Identified   Patient Goals and CMS Choice Patient states their goals for this hospitalization and ongoing recovery are:: return home   Choice offered to / list presented to : NA  Discharge Placement               Home         Discharge Plan and Services   Discharge Planning Services: CM Consult, Indigent [facility] Clinic, [facility] Program, Medication Assistance, Follow-up appt scheduled Post Acute Care Choice: NA          DME Arranged: N/A DME Agency: NA       HH Arranged: NA HH Agency: NA        Social Determinants of Health (SDOH) Interventions     Readmission Risk Interventions Readmission Risk Prevention Plan 09/07/2020 06/16/2019  Post Dischage Appt Complete -  Medication Screening Complete -  Transportation Screening Complete Complete  PCP or Specialist Appt within 5-7 Days - Complete  Home Care Screening - Complete  Medication Review (RN CM) - Complete

## 2020-09-07 NOTE — Discharge Summary (Signed)
Physician Discharge Summary  DANEILLE DESILVA WJX:[id number] DOB: 25-Jun-1956 DOA: 09/04/2020  PCP: Cain Saupe, MD  Admit date: 09/04/2020 Discharge date: 09/07/2020  Admitted From: Home Disposition: Home  Recommendations for Outpatient Follow-up:  1. Follow-up with PCP in 1 week 2. Take insulin 70/30-20 units twice daily with meals 3. Check blood sugar 4 times daily 4. Advise carb consistent diet, exercise and weight loss. 5. Follow-up with cardiology as a scheduled 6. Discuss about Metformin with PCP on follow-up visit  Home Health: None Equipment/Devices: Glucometer  discharge Condition: Stable CODE STATUS: Full code Diet recommendation: Carb consistent diet and low-sodium diet  Brief/Interim Summary:  Patient is 64 year old female with past medical history of coronary artery disease status post CABG, hypertension, hyperlipidemia, obese, GERD presents to emergency department with generalized weakness, polyuria, malaise.  Upon arrival to ED: Patient afebrile, blood pressure: 160/95, HR: 106, respiratory rate: 20, oxygen saturation 99% on room air, no leukocytosis, sodium: 139, potassium: 3.3, chloride: 102, CO2: 21, BUN: 24, blood glucose: 714, anion gap: 15.  Osmolality: 348. Patient was noted to be confused.  Concern for HHS.  Patient started on insulin GTT in ED and admitted for further evaluation and management.  Discharge Diagnoses:   HHS:  -Likely in the setting of newly diagnosed diabetes.    A1c on admission more than 15%.  Patient started on insulin gtt. -Blood sugar - improved.  Transitioned to sliding scale insulin, CBG every 4 hours and start patient on Lantus 20 units daily. -09/06/2020-blood sugar labile-between 80s-300s -Discussed with diabetes coordinator: DC sliding scale insulin.  Started 70/30 tonight 12 units at supper.  Started 70/3020 units twice daily from tomorrow AM.  Add NovoLog 0 to 15 units 3 times daily and 0 to 5 units nightly. -On the day of discharge:  Patient's blood sugar improved.   -Discharged patient home on insulin 70/30-20 units twice daily -Glucometer and test strips/lancets etc. provided before discharge.  Hyperkalemia: Potassium 5.6-->2.9.  Replenished.  Repeat potassium level: WNL.  Coronary artery disease status post CABG in 2020: -Patient denies ACS symptoms.  Initial troponin: 38 trended down to 28 28 -Continue aspirin.  Reviewed home meds.  Not on beta-blocker or statin  Asymptomatic bacteriuria: Patient's UA is positive for rare bacteria.  Patient denied any urinary symptoms.    Was not a started on antibiotics  Hypertension:  Remained stable on amlodipine  Chronic diastolic CHF: Patient appeared dry on admission.  Lasix was held.  Resume Lasix at the time of discharge. -Follow-up with cardiology as a scheduled.  Obesity: -Diet modification/exercise and weight loss recommended.  Chronic constipation: Continued Colace  Discharge Instructions  Discharge Instructions    Diet - low sodium heart healthy   Complete by: As directed    Diet Carb Modified   Complete by: As directed    Discharge instructions   Complete by: As directed    Follow-up with PCP in 1 week Take insulin 70/30-20 units twice daily with meals.  Monitor blood sugar closely at home Diet/exercise/weight loss recommended Follow-up with cardiology as needed   Increase activity slowly   Complete by: As directed      Allergies as of 09/07/2020      Reactions   Atorvastatin Other (See Comments)   Made body feel flushed and hot all over   Carafate [sucralfate] Swelling, Other (See Comments)   Patient stated it made her face swell (angioedema)- no breathing issues, however   Lisinopril Swelling, Other (See Comments)   Patient stated it  made her face swell (angioedema)- no breathing issues, however      Medication List    STOP taking these medications   amLODipine 5 MG tablet Commonly known as: NORVASC     TAKE these medications    acetaminophen 325 MG tablet Commonly known as: TYLENOL Take 650 mg by mouth every 6 (six) hours as needed for mild pain or moderate pain.   aspirin 325 MG tablet Take 325 mg by mouth daily. What changed: Another medication with the same name was removed. Continue taking this medication, and follow the directions you see here.   docusate sodium 100 MG capsule Commonly known as: COLACE Take 1 capsule (100 mg total) by mouth 2 (two) times daily as needed for mild constipation. What changed: when to take this   Effer-K 10 MEQ Tbef Generic drug: Potassium Bicarb-Citric Acid Take 10 mEq by mouth daily.   furosemide 40 MG tablet Commonly known as: LASIX Take 40 mg by mouth.   furosemide 20 MG tablet Commonly known as: LASIX Take 1 tablet (20 mg total) by mouth daily.   insulin aspart protamine- aspart (70-30) 100 UNIT/ML injection Commonly known as: NOVOLOG MIX 70/30 Inject 0.2 mLs (20 Units total) into the skin 2 (two) times daily with a meal.   Praluent 75 MG/ML Soaj Generic drug: Alirocumab Inject 1 pen into the skin every 14 (fourteen) days.       Allergies  Allergen Reactions  . Atorvastatin Other (See Comments)    Made body feel flushed and hot all over  . Carafate [Sucralfate] Swelling and Other (See Comments)    Patient stated it made her face swell (angioedema)- no breathing issues, however  . Lisinopril Swelling and Other (See Comments)    Patient stated it made her face swell (angioedema)- no breathing issues, however    Consultations:  None   Procedures/Studies: CT Head Wo Contrast  Result Date: 09/04/2020 CLINICAL DATA:  Right arm pain and weakness. EXAM: CT HEAD WITHOUT CONTRAST TECHNIQUE: Contiguous axial images were obtained from the base of the skull through the vertex without intravenous contrast. COMPARISON:  None. FINDINGS: Brain: There is mild cerebral atrophy with widening of the extra-axial spaces and ventricular dilatation. There are areas of  decreased attenuation within the white matter tracts of the supratentorial brain, consistent with microvascular disease changes. Normal basal ganglia. Normal bilateral thalami. Vascular: No hyperdense vessel or unexpected calcification. Skull: Normal. Negative for fracture or focal lesion. Sinuses/Orbits: No acute finding. Other: None. IMPRESSION: 1. Mild cerebral atrophy and microvascular disease changes of the supratentorial brain. 2. No acute intracranial abnormality. Electronically Signed   By: Aram Candela M.D.   On: 09/04/2020 19:49   DG Chest Port 1 View  Result Date: 09/04/2020 CLINICAL DATA:  Weakness. EXAM: PORTABLE CHEST 1 VIEW COMPARISON:  08/21/2019 FINDINGS: Median sternotomy and CABG. Heart is enlarged and stable in configuration. There are no focal consolidations. No pleural effusions or pulmonary edema. IMPRESSION: Cardiomegaly. Electronically Signed   By: Norva Pavlov M.D.   On: 09/04/2020 18:45       Subjective:  Patient seen and examined.  Sitting comfortably on the bed.  Very pleasant.  Tells me that she is doing fine and has no complaints.  Ready to go home.  I called patient's daughter and discussed about the discharge plan and she verbalized understanding.  Discharge Exam: Vitals:   09/07/20 0800 09/07/20 0915  BP:  (!) 143/76  Pulse: 81   Resp: 17 14  Temp:  98.1  F (36.7 C)  SpO2: 99% 99%   Vitals:   09/07/20 0424 09/07/20 0700 09/07/20 0800 09/07/20 0915  BP: 128/61   (!) 143/76  Pulse: 73 72 81   Resp: 14 13 17 14   Temp: 98.2 F (36.8 C)   98.1 F (36.7 C)  TempSrc: Oral   Oral  SpO2: 99% 99% 99% 99%    General: Pt is alert, awake, not in acute distress Cardiovascular: RRR, S1/S2 +, no rubs, no gallops Respiratory: CTA bilaterally, no wheezing, no rhonchi Abdominal: Soft, NT, ND, bowel sounds + Extremities: no edema, no cyanosis    The results of significant diagnostics from this hospitalization (including imaging, microbiology,  ancillary and laboratory) are listed below for reference.     Microbiology: Recent Results (from the past 240 hour(s))  Respiratory Panel by RT PCR (Flu A&B, Covid) - Nasopharyngeal Swab     Status: None   Collection Time: 09/04/20  7:08 PM   Specimen: Nasopharyngeal Swab  Result Value Ref Range Status   SARS Coronavirus 2 by RT PCR NEGATIVE NEGATIVE Final    Comment: (NOTE) SARS-CoV-2 target nucleic acids are NOT DETECTED.  The SARS-CoV-2 RNA is generally detectable in upper respiratoy specimens during the acute phase of infection. The lowest concentration of SARS-CoV-2 viral copies this assay can detect is 131 copies/mL. A negative result does not preclude SARS-Cov-2 infection and should not be used as the sole basis for treatment or other patient management decisions. A negative result may occur with  improper specimen collection/handling, submission of specimen other than nasopharyngeal swab, presence of viral mutation(s) within the areas targeted by this assay, and inadequate number of viral copies (<131 copies/mL). A negative result must be combined with clinical observations, patient history, and epidemiological information. The expected result is Negative.  Fact Sheet for Patients:  09/06/20  Fact Sheet for Healthcare Providers:  https://www.moore.com/  This test is no t yet approved or cleared by the https://www.young.biz/ FDA and  has been authorized for detection and/or diagnosis of SARS-CoV-2 by FDA under an Emergency Use Authorization (EUA). This EUA will remain  in effect (meaning this test can be used) for the duration of the COVID-19 declaration under Section 564(b)(1) of the Act, 21 U.S.C. section 360bbb-3(b)(1), unless the authorization is terminated or revoked sooner.     Influenza A by PCR NEGATIVE NEGATIVE Final   Influenza B by PCR NEGATIVE NEGATIVE Final    Comment: (NOTE) The Xpert Xpress SARS-CoV-2/FLU/RSV  assay is intended as an aid in  the diagnosis of influenza from Nasopharyngeal swab specimens and  should not be used as a sole basis for treatment. Nasal washings and  aspirates are unacceptable for Xpert Xpress SARS-CoV-2/FLU/RSV  testing.  Fact Sheet for Patients: [place]  Fact Sheet for Healthcare Providers: https://www.moore.com/  This test is not yet approved or cleared by the https://www.young.biz/ FDA and  has been authorized for detection and/or diagnosis of SARS-CoV-2 by  FDA under an Emergency Use Authorization (EUA). This EUA will remain  in effect (meaning this test can be used) for the duration of the  Covid-19 declaration under Section 564(b)(1) of the Act, 21  U.S.C. section 360bbb-3(b)(1), unless the authorization is  terminated or revoked. Performed at [facility], [address], [place], [place]      Labs: BNP (last 3 results) No results for input(s): BNP in the last 8760 hours. Basic Metabolic Panel: Recent Labs  Lab 09/05/20 0724 09/05/20 1022 09/05/20 1712 09/06/20 0132  09/07/20 0106  NA 141 141 136 135 138  K 3.6 2.9* 3.5 3.7 3.9  CL 105 104 98 100 103  CO2 26 28 29 25 24   GLUCOSE 270* 252* 445* 364* 148*  BUN 10 8 11 18 15   CREATININE 0.87 0.81 1.17* 1.15* 0.86  CALCIUM 8.3* 8.7* 8.8* 8.7* 8.9  MG  --   --  1.6* 1.7  --    Liver Function Tests: Recent Labs  Lab 09/04/20 1747  AST 14*  ALT 21  ALKPHOS 75  BILITOT 0.8  PROT 7.8  ALBUMIN 3.9   No results for input(s): LIPASE, AMYLASE in the last 168 hours. No results for input(s): AMMONIA in the last 168 hours. CBC: Recent Labs  Lab 09/04/20 1747 09/04/20 1805 09/04/20 2243 09/07/20 0106  WBC 7.5  --  6.8 6.3  HGB 15.1* 16.3* 14.0 13.3  HCT 48.0* 48.0* 44.7 40.2  MCV 87.4  --  86.8 85.5  PLT 233  --  222 218   Cardiac Enzymes: Recent Labs  Lab 09/04/20 1747  CKTOTAL 59   BNP: Invalid input(s):  POCBNP CBG: Recent Labs  Lab 09/06/20 1218 09/06/20 1606 09/06/20 1756 09/06/20 2105 09/07/20 0615  GLUCAP 339* 300* 257* 330* 190*   D-Dimer No results for input(s): DDIMER in the last 72 hours. Hgb A1c Recent Labs    09/04/20 2243  HGBA1C >15.5*   Lipid Profile No results for input(s): CHOL, HDL, LDLCALC, TRIG, CHOLHDL, LDLDIRECT in the last 72 hours. Thyroid function studies No results for input(s): TSH, T4TOTAL, T3FREE, THYROIDAB in the last 72 hours.  Invalid input(s): FREET3 Anemia work up No results for input(s): VITAMINB12, FOLATE, FERRITIN, TIBC, IRON, RETICCTPCT in the last 72 hours. Urinalysis    Component Value Date/Time   COLORURINE COLORLESS (A) 09/04/2020 2020   APPEARANCEUR CLEAR 09/04/2020 2020   LABSPEC 1.015 09/04/2020 2020   PHURINE 5.0 09/04/2020 2020   GLUCOSEU >=500 (A) 09/04/2020 2020   HGBUR NEGATIVE 09/04/2020 2020   BILIRUBINUR NEGATIVE 09/04/2020 2020   KETONESUR 20 (A) 09/04/2020 2020   PROTEINUR NEGATIVE 09/04/2020 2020   NITRITE NEGATIVE 09/04/2020 2020   LEUKOCYTESUR NEGATIVE 09/04/2020 2020   Sepsis Labs Invalid input(s): PROCALCITONIN,  WBC,  LACTICIDVEN Microbiology Recent Results (from the past 240 hour(s))  Respiratory Panel by RT PCR (Flu A&B, Covid) - Nasopharyngeal Swab     Status: None   Collection Time: 09/04/20  7:08 PM   Specimen: Nasopharyngeal Swab  Result Value Ref Range Status   SARS Coronavirus 2 by RT PCR NEGATIVE NEGATIVE Final    Comment: (NOTE) SARS-CoV-2 target nucleic acids are NOT DETECTED.  The SARS-CoV-2 RNA is generally detectable in upper respiratoy specimens during the acute phase of infection. The lowest concentration of SARS-CoV-2 viral copies this assay can detect is 131 copies/mL. A negative result does not preclude SARS-Cov-2 infection and should not be used as the sole basis for treatment or other patient management decisions. A negative result may occur with  improper specimen  collection/handling, submission of specimen other than nasopharyngeal swab, presence of viral mutation(s) within the areas targeted by this assay, and inadequate number of viral copies (<131 copies/mL). A negative result must be combined with clinical observations, patient history, and epidemiological information. The expected result is Negative.  Fact Sheet for Patients:  09/06/2020  Fact Sheet for Healthcare Providers:  09/06/20  This test is no t yet approved or cleared by the https://www.moore.com/ FDA and  has been authorized for detection and/or  diagnosis of SARS-CoV-2 by FDA under an Emergency Use Authorization (EUA). This EUA will remain  in effect (meaning this test can be used) for the duration of the COVID-19 declaration under Section 564(b)(1) of the Act, 21 U.S.C. section 360bbb-3(b)(1), unless the authorization is terminated or revoked sooner.     Influenza A by PCR NEGATIVE NEGATIVE Final   Influenza B by PCR NEGATIVE NEGATIVE Final    Comment: (NOTE) The Xpert Xpress SARS-CoV-2/FLU/RSV assay is intended as an aid in  the diagnosis of influenza from Nasopharyngeal swab specimens and  should not be used as a sole basis for treatment. Nasal washings and  aspirates are unacceptable for Xpert Xpress SARS-CoV-2/FLU/RSV  testing.  Fact Sheet for Patients: https://www.moore.com/https://www.fda.gov/media/142436/download  Fact Sheet for Healthcare Providers: https://www.young.biz/https://www.fda.gov/media/142435/download  This test is not yet approved or cleared by the [place] FDA and  has been authorized for detection and/or diagnosis of SARS-CoV-2 by  FDA under an Emergency Use Authorization (EUA). This EUA will remain  in effect (meaning this test can be used) for the duration of the  Covid-19 declaration under Section 564(b)(1) of the Act, 21  U.S.C. section 360bbb-3(b)(1), unless the authorization is  terminated or revoked. Performed at [facility], [address] [place], [place] [address]      Time coordinating discharge: Over 30 minutes  SIGNED:   Ollen Bowlinka R Prudie Guthridge, MD  [facility] 09/07/2020, 9:58 AM Pager   If 7PM-7AM, please contact night-coverage [email or web address]

## 2020-09-07 NOTE — Plan of Care (Signed)

## 2020-09-07 NOTE — Progress Notes (Signed)
Noted referral for benefits check on farxiga and Jardiance 10mg /d- pt has no insurance or drug coverage- patient has to pay out of pocket of medications- total cost of the drug.

## 2020-09-08 ENCOUNTER — Telehealth: Payer: Self-pay

## 2020-09-08 ENCOUNTER — Encounter: Payer: Self-pay | Admitting: Cardiology

## 2020-09-08 ENCOUNTER — Other Ambulatory Visit: Payer: Self-pay

## 2020-09-08 ENCOUNTER — Ambulatory Visit (INDEPENDENT_AMBULATORY_CARE_PROVIDER_SITE_OTHER): Payer: Self-pay | Admitting: Cardiology

## 2020-09-08 ENCOUNTER — Telehealth: Payer: Self-pay | Admitting: Pharmacist

## 2020-09-08 VITALS — BP 118/80 | HR 95 | Ht 64.0 in | Wt 208.8 lb

## 2020-09-08 DIAGNOSIS — E7800 Pure hypercholesterolemia, unspecified: Secondary | ICD-10-CM

## 2020-09-08 DIAGNOSIS — I5032 Chronic diastolic (congestive) heart failure: Secondary | ICD-10-CM

## 2020-09-08 DIAGNOSIS — I2510 Atherosclerotic heart disease of native coronary artery without angina pectoris: Secondary | ICD-10-CM

## 2020-09-08 DIAGNOSIS — I10 Essential (primary) hypertension: Secondary | ICD-10-CM

## 2020-09-08 MED ORDER — ROSUVASTATIN CALCIUM 40 MG PO TABS: 40.0000 mg | ORAL_TABLET | Freq: Every day | ORAL | 3 refills | Status: DC

## 2020-09-08 NOTE — Telephone Encounter (Signed)
Aundra Millet, [facility] called and left message to call back.  Pt was scheduled for fasting labs this coming Monday, 11/8.  Those will need to be moved out.

## 2020-09-08 NOTE — Telephone Encounter (Signed)
Received message from [place], CMA about Praluent renewal for pt. Pt never started on Praluent after her last visit at [facility]. She was approved for Praluent but needed to activate a copay card and fill at a specialty pharmacy. This information was provided to her daughter (English-speaking and pt is not) multiple times but was never done by pt's daughter. Pt no longer has insurance.  When I last saw pt, she was taking atorvastatin 80mg  daily and LDL was close to goal at 76. However, statin was removed from her med list when she was seen by on 02/23/20, she was not restarted on alternative statin therapy. During most recent hospital admission 09/04/20 for new dx of DM, it was also noted that pt was not on statin therapy and again pt was not started on any lipid-lowering therapy.  Pt's daughter states that pt experienced dry mouth on atorvastatin (this was likely secondary to undiagnosed DM rather than statin). Will start pt on rosuvastatin 40mg  daily which should bring LDL < 70. Pt's daughter will relay this message to pt.

## 2020-09-08 NOTE — Addendum Note (Signed)
Addended by: Julio Sicks on: 09/08/2020 11:46 AM   Modules accepted: Orders

## 2020-09-08 NOTE — Progress Notes (Signed)
Cardiology Office Note:    Date:  09/08/2020   ID:  Kimberly Bryant, DOB 1956-04-02, MRN [id number]  PCP:  Cain Saupe, MD  Cardiologist:  Armanda Magic, MD    Referring MD: Cain Saupe, MD   Chief Complaint  Patient presents with  . Coronary Artery Disease  . Hypertension  . Hyperlipidemia  . Congestive Heart Failure    History of Present Illness:    Kimberly Bryant is a 64 y.o. female with a hx of hypertension, HLD, angioedema on ACE inhibitor, GERD, CAD status post acute NSTEMI 05/2019 followed by CABG x4 with a LIMA to the LAD, SVG to OM, SVG to diagonal, SVG to distal LAD.  EF normal.    Patient comes in accompanied by her daughter who interprets [place] for her.  She is here today for followup and is doing well.  She denies any chest pain or pressure, SOB, DOE, PND, orthopnea, LE edema, dizziness, palpitations or syncope. She is compliant with her meds and is tolerating meds with no SE.    Past Medical History:  Diagnosis Date  . CAD (coronary artery disease)    status post acute NSTEMI 05/2019 followed by CABG x4 with a LIMA to the LAD, SVG to OM, SVG to diagonal, SVG to distal LAD.   Marland Kitchen Hyperlipidemia   . Hypertension   . Pleural effusion 06/24/2019   LEFT    Past Surgical History:  Procedure Laterality Date  . APPLICATION OF WOUND VAC N/A 06/23/2019   Procedure: APPLICATION OF WOUND VAC;  Surgeon: Linden Dolin, MD;  Location: [facility] OR;  Service: Thoracic;  Laterality: N/A;  . CORONARY ARTERY BYPASS GRAFT N/A 06/10/2019   Procedure: CORONARY ARTERY BYPASS GRAFTING (CABG) x Four, using left internal mammary artery and right leg greater saphenous vein harvested endoscopically;  Surgeon: Linden Dolin, MD;  Location: [facility] OR;  Service: Open Heart Surgery;  Laterality: N/A;  . LEFT HEART CATH AND CORONARY ANGIOGRAPHY N/A 06/09/2019   Procedure: LEFT HEART CATH AND CORONARY ANGIOGRAPHY;  Surgeon: Lennette Bihari, MD;  Location: [facility] INVASIVE CV LAB;  Service: Cardiovascular;   Laterality: N/A;  . STERNAL WOUND DEBRIDEMENT N/A 06/23/2019   Procedure: STERNAL WOUND DEBRIDEMENT;  Surgeon: Linden Dolin, MD;  Location: [facility] OR;  Service: Thoracic;  Laterality: N/A;  . TEE WITHOUT CARDIOVERSION N/A 06/10/2019   Procedure: TRANSESOPHAGEAL ECHOCARDIOGRAM (TEE);  Surgeon: Linden Dolin, MD;  Location: [facility] OR;  Service: Open Heart Surgery;  Laterality: N/A;    Current Medications: Current Meds  Medication Sig  . acetaminophen (TYLENOL) 325 MG tablet Take 650 mg by mouth every 6 (six) hours as needed for mild pain or moderate pain.   Marland Kitchen amLODipine (NORVASC) 5 MG tablet Take 5 mg by mouth daily.  Marland Kitchen aspirin 325 MG tablet Take 325 mg by mouth daily.  Marland Kitchen docusate sodium (COLACE) 100 MG capsule Take 1 capsule (100 mg total) by mouth 2 (two) times daily as needed for mild constipation.  . furosemide (LASIX) 20 MG tablet Take 20 mg by mouth.  . insulin aspart protamine- aspart (NOVOLOG MIX 70/30) (70-30) 100 UNIT/ML injection Inject 0.2 mLs (20 Units total) into the skin 2 (two) times daily with a meal.  . Insulin Pen Needle (PEN NEEDLES) 32G X 4 MM MISC Use twice daily with Novolog  . Potassium Bicarb-Citric Acid (EFFER-K) 10 MEQ TBEF Take 10 mEq by mouth daily.  . rosuvastatin (CRESTOR) 40 MG tablet Take 1 tablet (40 mg total) by mouth  daily.  . [DISCONTINUED] furosemide (LASIX) 40 MG tablet Take 40 mg by mouth.     Allergies:   Atorvastatin, Carafate [sucralfate], and Lisinopril   Social History   Socioeconomic History  . Marital status: Married    Spouse name: Not on file  . Number of children: Not on file  . Years of education: Not on file  . Highest education level: Not on file  Occupational History  . Not on file  Tobacco Use  . Smoking status: Never Smoker  . Smokeless tobacco: Never Used  Vaping Use  . Vaping Use: Never used  Substance and Sexual Activity  . Alcohol use: Never  . Drug use: Never  . Sexual activity: Not Currently  Other Topics Concern  .  Not on file  Social History Narrative  . Not on file   Social Determinants of Health   Financial Resource Strain:   . Difficulty of Paying Living Expenses: Not on file  Food Insecurity:   . Worried About Programme researcher, broadcasting/film/video in the Last Year: Not on file  . Ran Out of Food in the Last Year: Not on file  Transportation Needs:   . Lack of Transportation (Medical): Not on file  . Lack of Transportation (Non-Medical): Not on file  Physical Activity:   . Days of Exercise per Week: Not on file  . Minutes of Exercise per Session: Not on file  Stress:   . Feeling of Stress : Not on file  Social Connections:   . Frequency of Communication with Friends and Family: Not on file  . Frequency of Social Gatherings with Friends and Family: Not on file  . Attends Religious Services: Not on file  . Active Member of Clubs or Organizations: Not on file  . Attends Banker Meetings: Not on file  . Marital Status: Not on file     Family History: The patient's family history includes Hypertension in her mother.  ROS:   Please see the history of present illness.    ROS  All other systems reviewed and negative.   EKGs/Labs/Other Studies Reviewed:    The following studies were reviewed today: none  EKG:  EKG is not ordered today.  Recent Labs: 09/04/2020: ALT 21 09/06/2020: Magnesium 1.7 09/07/2020: BUN 15; Creatinine, Ser 0.86; Hemoglobin 13.3; Platelets 218; Potassium 3.9; Sodium 138   Recent Lipid Panel    Component Value Date/Time   CHOL 138 08/24/2019 1110   TRIG 81 08/24/2019 1110   HDL 46 08/24/2019 1110   CHOLHDL 3.0 08/24/2019 1110   LDLCALC 76 08/24/2019 1110    Physical Exam:    VS:  BP 118/80   Pulse 95   Ht 5\' 4"  (1.626 m)   Wt 208 lb 12.8 oz (94.7 kg)   SpO2 96%   BMI 35.84 kg/m     Wt Readings from Last 3 Encounters:  09/08/20 208 lb 12.8 oz (94.7 kg)  02/23/20 209 lb 1.9 oz (94.9 kg)  08/24/19 200 lb (90.7 kg)     GEN: Well nourished, well  developed in no acute distress HEENT: Normal NECK: No JVD; No carotid bruits LYMPHATICS: No lymphadenopathy CARDIAC:RRR, no murmurs, rubs, gallops RESPIRATORY:  Clear to auscultation without rales, wheezing or rhonchi  ABDOMEN: Soft, non-tender, non-distended MUSCULOSKELETAL:  No edema; No deformity  SKIN: Warm and dry NEUROLOGIC:  Alert and oriented x 3 PSYCHIATRIC:  Normal affect   ASSESSMENT:    1. Coronary artery disease involving native coronary artery of native  heart without angina pectoris   2. Primary hypertension   3. Pure hypercholesterolemia   4. Chronic diastolic CHF (congestive heart failure) (HCC)    PLAN:    In order of problems listed above:  1.  ASCAD - s/p NSTEMI 05/2019 followed by CABG x4 with a LIMA to the LAD, SVG to OM, SVG to diagonal, SVG to distal LAD.  -she has not had any anginal symptoms since I saw her last -continue ASA, statin and BB.  2.  HTN -BP is well controlled on exam today -continue amlodipine 5mg  daily, Lopressor 25mg  BID  3.  HLD -LDL goal < 70 -LDL 69 a year ago -check FLP and ALT -continue crestor 40mg   daily and Praluent  4.  Chronic diastolic CHF -appears euvolemic on exam today -continue lasix 20mg  daily -SCr stable at 0.86 and K+ 3.9 in Nov 2021  Medication Adjustments/Labs and Tests Ordered: Current medicines are reviewed at length with the patient today.  Concerns regarding medicines are outlined above.  No orders of the defined types were placed in this encounter.  No orders of the defined types were placed in this encounter.   Signed, , MD  09/08/2020 11:32 AM    [facility]

## 2020-09-08 NOTE — Telephone Encounter (Signed)
Transition Care Management Unsuccessful Follow-up Telephone Call  Date of discharge and from where:  09/07/2020, [facility]  Attempts:  1st Attempt  Reason for unsuccessful TCM follow-up call:  Left voice message - call placed to patient # [phone] with assistance of [place] interpreter # [id number]/[employer].  Message requests call back to this CM.  She has appointment scheduled at [facility] 09/19/2020

## 2020-09-08 NOTE — Telephone Encounter (Signed)
Please call daugthter and tell her that we need her mom to take Crestor daily for 6 weeks and then repest FLP and ALT

## 2020-09-08 NOTE — Patient Instructions (Signed)
Medication Instructions:  .instuc  *If you need a refill on your cardiac medications before your next appointment, please call your pharmacy*   Lab Work: Lipid and ALT when fasting (nothing to eat or drink after midnight except water and black coffee). If you have labs (blood work) drawn today and your tests are completely normal, you will receive your results only by: Marland Kitchen MyChart Message (if you have MyChart) OR . A paper copy in the mail If you have any lab test that is abnormal or we need to change your treatment, we will call you to review the results.   Testing/Procedures: None   Follow-Up: At [facility], you and your health needs are our priority.  As part of our continuing mission to provide you with exceptional heart care, we have created designated Provider Care Teams.  These Care Teams include your primary Cardiologist (physician) and Advanced Practice Providers (APPs -  Physician Assistants and Nurse Practitioners) who all work together to provide you with the care you need, when you need it.  We recommend signing up for the patient portal called "MyChart".  Sign up information is provided on this After Visit Summary.  MyChart is used to connect with patients for Virtual Visits (Telemedicine).  Patients are able to view lab/test results, encounter notes, upcoming appointments, etc.  Non-urgent messages can be sent to your provider as well.   To learn more about what you can do with MyChart, go to [email or web address].    Your next appointment:   12 month(s)  The format for your next appointment:   In Person  Provider:   You may see Armanda Magic, MD or one of the following Advanced Practice Providers on your designated Care Team:    Ronie Spies, PA-C  Jacolyn Reedy, PA-C    Other Instructions

## 2020-09-09 ENCOUNTER — Telehealth: Payer: Self-pay

## 2020-09-09 NOTE — Telephone Encounter (Signed)
Transition Care Management Follow-up Telephone Call  Date of discharge and from where:Kimberly Bryant [facility] 09/07/2020 How have you been since you were released from the hospital? Much better  Any questions or concerns? No questions/concerns reported.  Items Reviewed: Did the pt receive and understand the discharge instructions provided? Stated that have the instructions and have no questions.  Medications obtained and verified? Daughter next to patient She said that they have the medication list  and the hospital staff reviewed them in detail prior to discharge. She said that he has all of the medications and they have no questions.  Any new allergies since your discharge? None reported  Do you have support at home? Yes,daughter and family Other (ie: DME, Home Health, etc)       Functional Questionnaire: (I = Independent and D = Dependent) ADL's:  Independent.      Follow up appointments reviewed:    PCP Hospital f/u appt confirmed?08/19/2020 with Park Pope NP at [facility] f/u appt confirmed? None scheduled at this time   Are transportation arrangements needed?. They have transportation    If their condition worsens, is the pt aware to call  their PCP or go to the ED? Yes.Made pt aware if condition worsen or start experiencing rapid weight gain, chest pain, diff breathing, SOB, high fevers, or bleading to refer imediately to ED for further evaluation.   Was the patient provided with contact information for the PCP's office or ED? He has the phone number   Was the pt encouraged to call back with questions or concerns?yes

## 2020-09-12 ENCOUNTER — Other Ambulatory Visit: Payer: Self-pay

## 2020-09-12 IMAGING — DX DG CHEST 2V
2 series · 2 of 2 positions shown · non-contrast
Comparison: 09/03/2018

CLINICAL DATA: Follow-up bibasilar atelectasis

EXAM:
CHEST - 2 VIEW

[chest pa]
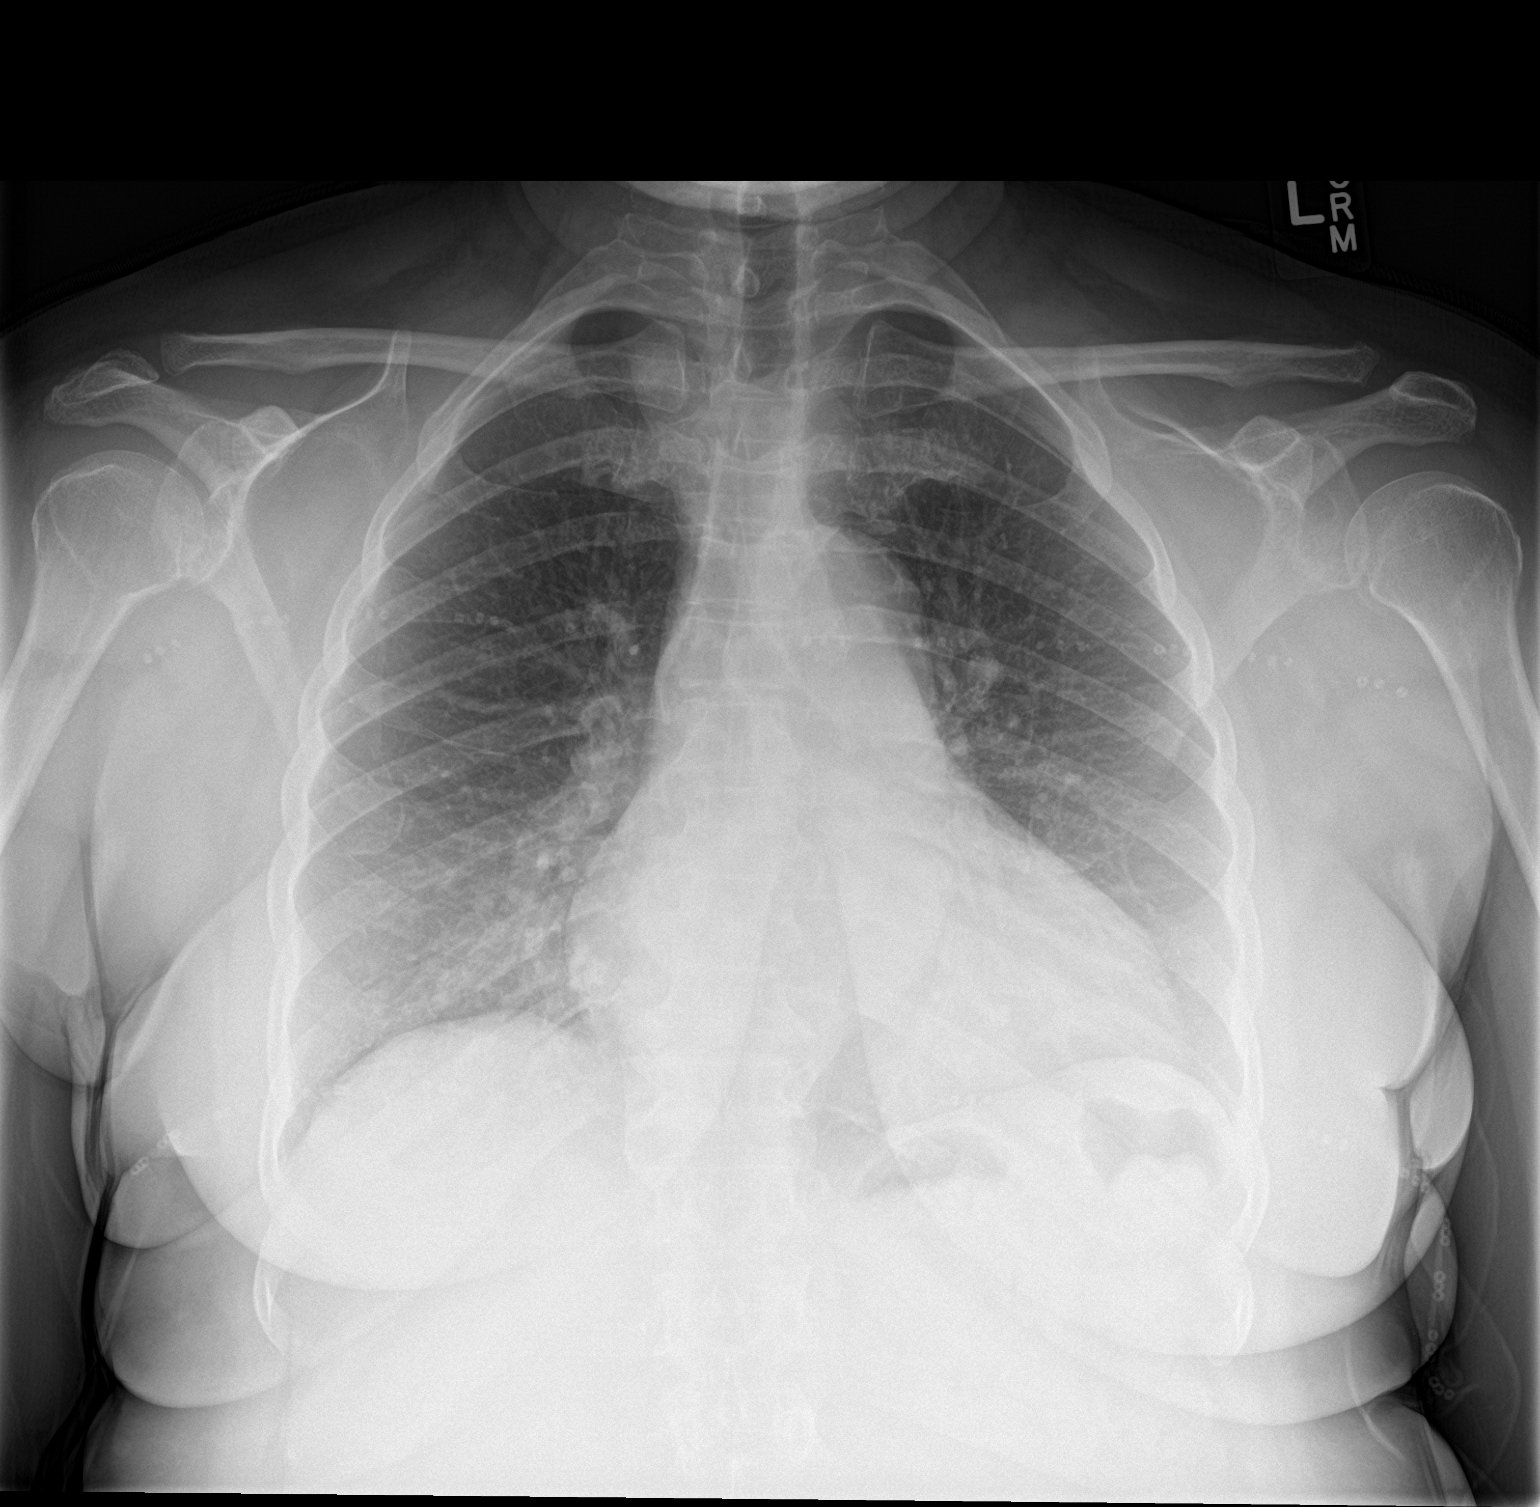

[chest lat]
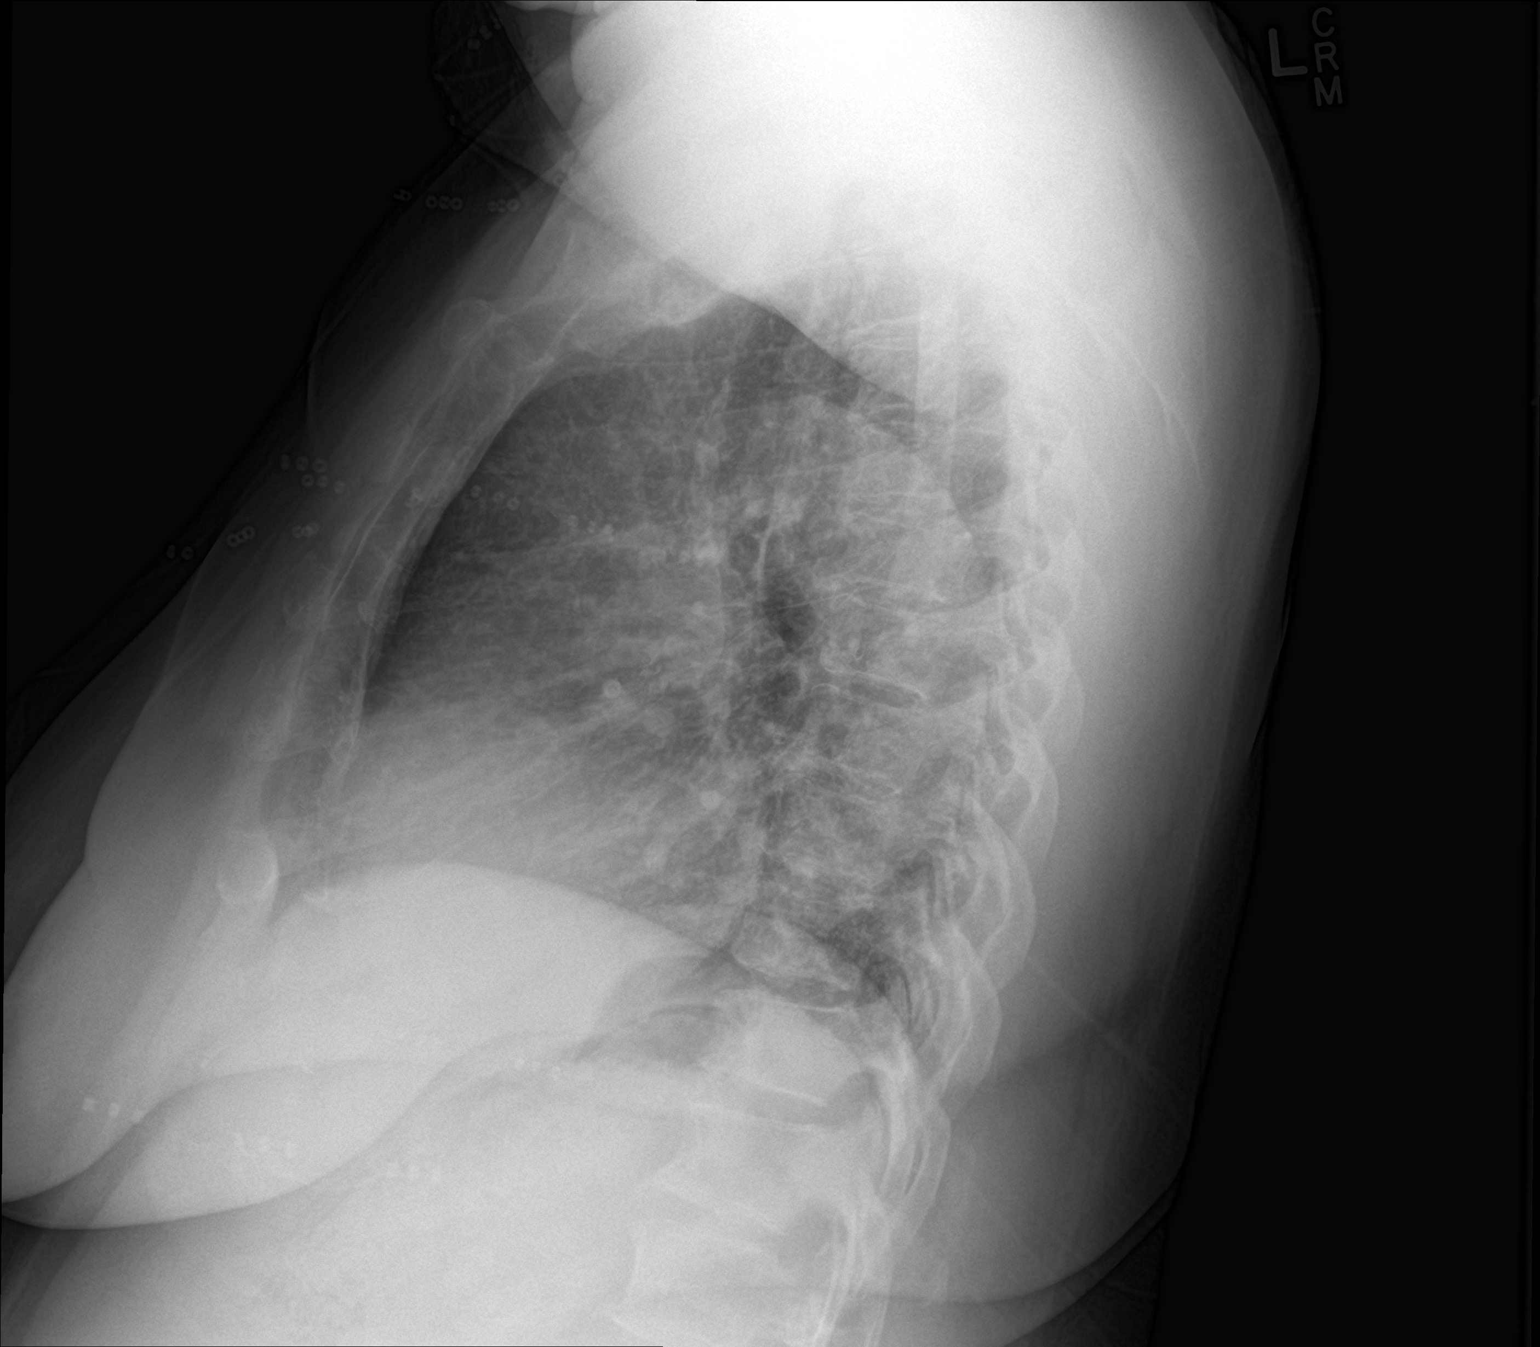

[2 of 2 positions shown; findings below may reference images not displayed]

FINDINGS: The heart size and mediastinal contours are within normal limits.
Both lungs are clear. The visualized skeletal structures are
unremarkable.
IMPRESSION: No active cardiopulmonary disease.

## 2020-09-13 NOTE — Telephone Encounter (Signed)
Attempted to call patient. Unable to leave voicemail.  

## 2020-09-15 NOTE — Telephone Encounter (Signed)
Attempted to call patient's daughter. Unable to leave voicemail.

## 2020-09-19 ENCOUNTER — Ambulatory Visit (HOSPITAL_BASED_OUTPATIENT_CLINIC_OR_DEPARTMENT_OTHER): Payer: Self-pay | Admitting: Pharmacist

## 2020-09-19 ENCOUNTER — Other Ambulatory Visit: Payer: Self-pay

## 2020-09-19 ENCOUNTER — Encounter: Payer: Self-pay | Admitting: Family

## 2020-09-19 ENCOUNTER — Ambulatory Visit: Payer: Self-pay | Attending: Family | Admitting: Family

## 2020-09-19 VITALS — BP 138/84 | HR 84 | Resp 16 | Ht 61.5 in | Wt 207.8 lb

## 2020-09-19 DIAGNOSIS — Z789 Other specified health status: Secondary | ICD-10-CM

## 2020-09-19 DIAGNOSIS — I5032 Chronic diastolic (congestive) heart failure: Secondary | ICD-10-CM

## 2020-09-19 DIAGNOSIS — I10 Essential (primary) hypertension: Secondary | ICD-10-CM

## 2020-09-19 DIAGNOSIS — E785 Hyperlipidemia, unspecified: Secondary | ICD-10-CM

## 2020-09-19 DIAGNOSIS — Z2821 Immunization not carried out because of patient refusal: Secondary | ICD-10-CM

## 2020-09-19 DIAGNOSIS — E1165 Type 2 diabetes mellitus with hyperglycemia: Secondary | ICD-10-CM

## 2020-09-19 DIAGNOSIS — K5909 Other constipation: Secondary | ICD-10-CM

## 2020-09-19 DIAGNOSIS — Z23 Encounter for immunization: Secondary | ICD-10-CM

## 2020-09-19 DIAGNOSIS — I2510 Atherosclerotic heart disease of native coronary artery without angina pectoris: Secondary | ICD-10-CM

## 2020-09-19 DIAGNOSIS — R8271 Bacteriuria: Secondary | ICD-10-CM

## 2020-09-19 DIAGNOSIS — E1100 Type 2 diabetes mellitus with hyperosmolarity without nonketotic hyperglycemic-hyperosmolar coma (NKHHC): Secondary | ICD-10-CM

## 2020-09-19 DIAGNOSIS — E875 Hyperkalemia: Secondary | ICD-10-CM

## 2020-09-19 DIAGNOSIS — Z09 Encounter for follow-up examination after completed treatment for conditions other than malignant neoplasm: Secondary | ICD-10-CM

## 2020-09-19 DIAGNOSIS — E669 Obesity, unspecified: Secondary | ICD-10-CM

## 2020-09-19 LAB — GLUCOSE, POCT (MANUAL RESULT ENTRY): POC Glucose: 147 mg/dl — AB (ref 70–99)

## 2020-09-19 MED ORDER — METOPROLOL SUCCINATE ER 25 MG PO TB24: 25.0000 mg | ORAL_TABLET | Freq: Every day | ORAL | 0 refills | Status: DC

## 2020-09-19 MED ORDER — METFORMIN HCL 500 MG PO TABS: 500.0000 mg | ORAL_TABLET | Freq: Two times a day (BID) | ORAL | 1 refills | Status: DC

## 2020-09-19 MED ORDER — AMLODIPINE BESYLATE 5 MG PO TABS: 5.0000 mg | ORAL_TABLET | Freq: Every day | ORAL | 0 refills | Status: DC

## 2020-09-19 NOTE — Patient Instructions (Addendum)
Continue Insulin 70/30 for diabetes.   Adding Metformin pill for diabetes.   Continue Amlodipine for high blood pressure.   Keep all appointments with Cardiology.  Follow-up with pharmacist in 1 month for diabetes check-up.   Follow-up with primary physician in 3 months or sooner if needed for chronic disease management.  Lab today.  Pneumonia vaccine today.   Apply for [facility] Financial [employer]/orange card Diabetes Basics  Diabetes (diabetes mellitus) is a long-term (chronic) disease. It occurs when the body does not properly use sugar (glucose) that is released from food after you eat. Diabetes may be caused by one or both of these problems:  Your pancreas does not make enough of a hormone called insulin.  Your body does not react in a normal way to insulin that it makes. Insulin lets sugars (glucose) go into cells in your body. This gives you energy. If you have diabetes, sugars cannot get into cells. This causes high blood sugar (hyperglycemia). Follow these instructions at home: How is diabetes treated? You may need to take insulin or other diabetes medicines daily to keep your blood sugar in balance. Take your diabetes medicines every day as told by your doctor. List your diabetes medicines here: Diabetes medicines  Name of medicine: ______________________________ ? Amount (dose): _______________ Time (a.m./p.m.): _______________ Notes: ___________________________________  Name of medicine: ______________________________ ? Amount (dose): _______________ Time (a.m./p.m.): _______________ Notes: ___________________________________  Name of medicine: ______________________________ ? Amount (dose): _______________ Time (a.m./p.m.): _______________ Notes: ___________________________________ If you use insulin, you will learn how to give yourself insulin by injection. You may need to adjust the amount based on the food that you eat. List the types of insulin you use  here: Insulin  Insulin type: ______________________________ ? Amount (dose): _______________ Time (a.m./p.m.): _______________ Notes: ___________________________________  Insulin type: ______________________________ ? Amount (dose): _______________ Time (a.m./p.m.): _______________ Notes: ___________________________________  Insulin type: ______________________________ ? Amount (dose): _______________ Time (a.m./p.m.): _______________ Notes: ___________________________________  Insulin type: ______________________________ ? Amount (dose): _______________ Time (a.m./p.m.): _______________ Notes: ___________________________________  Insulin type: ______________________________ ? Amount (dose): _______________ Time (a.m./p.m.): _______________ Notes: ___________________________________ How do I manage my blood sugar?  Check your blood sugar levels using a blood glucose monitor as directed by your doctor. Your doctor will set treatment goals for you. Generally, you should have these blood sugar levels:  Before meals (preprandial): 80-130 mg/dL (7.8-2.9 mmol/L).  After meals (postprandial): below 180 mg/dL (10 mmol/L).  A1c level: less than 7%. Write down the times that you will check your blood sugar levels: Blood sugar checks  Time: _______________ Notes: ___________________________________  Time: _______________ Notes: ___________________________________  Time: _______________ Notes: ___________________________________  Time: _______________ Notes: ___________________________________  Time: _______________ Notes: ___________________________________  Time: _______________ Notes: ___________________________________  What do I need to know about low blood sugar? Low blood sugar is called hypoglycemia. This is when blood sugar is at or below 70 mg/dL (3.9 mmol/L). Symptoms may include:  Feeling: ? Hungry. ? Worried or nervous (anxious). ? Sweaty and  clammy. ? Confused. ? Dizzy. ? Sleepy. ? Sick to your stomach (nauseous).  Having: ? A fast heartbeat. ? A headache. ? A change in your vision. ? Tingling or no feeling (numbness) around the mouth, lips, or tongue. ? Jerky movements that you cannot control (seizure).  Having trouble with: ? Moving (coordination). ? Sleeping. ? Passing out (fainting). ? Getting upset easily (irritability). Treating low blood sugar To treat low blood sugar, eat or drink something sugary right away. If you can think clearly and swallow  safely, follow the 15:15 rule:  Take 15 grams of a fast-acting carb (carbohydrate). Talk with your doctor about how much you should take.  Some fast-acting carbs are: ? Sugar tablets (glucose pills). Take 3-4 glucose pills. ? 6-8 pieces of hard candy. ? 4-6 oz (120-150 mL) of fruit juice. ? 4-6 oz (120-150 mL) of regular (not diet) soda. ? 1 Tbsp (15 mL) honey or sugar.  Check your blood sugar 15 minutes after you take the carb.  If your blood sugar is still at or below 70 mg/dL (3.9 mmol/L), take 15 grams of a carb again.  If your blood sugar does not go above 70 mg/dL (3.9 mmol/L) after 3 tries, get help right away.  After your blood sugar goes back to normal, eat a meal or a snack within 1 hour. Treating very low blood sugar If your blood sugar is at or below 54 mg/dL (3 mmol/L), you have very low blood sugar (severe hypoglycemia). This is an emergency. Do not wait to see if the symptoms will go away. Get medical help right away. Call your local emergency services (911 in the [place] Do not drive yourself to the hospital. Questions to ask your health care provider  Do I need to meet with a diabetes educator?  What equipment will I need to care for myself at home?  What diabetes medicines do I need? When should I take them?  How often do I need to check my blood sugar?  What number can I call if I have questions?  When is my next doctor's  visit?  Where can I find a support group for people with diabetes? Where to find more information  American Diabetes Association: www.diabetes.org  American Association of Diabetes Educators: www.diabeteseducator.org/patient-resources Contact a doctor if:  Your blood sugar is at or above 240 mg/dL (30.0 mmol/L) for 2 days in a row.  You have been sick or have had a fever for 2 days or more, and you are not getting better.  You have any of these problems for more than 6 hours: ? You cannot eat or drink. ? You feel sick to your stomach (nauseous). ? You throw up (vomit). ? You have watery poop (diarrhea). Get help right away if:  Your blood sugar is lower than 54 mg/dL (3 mmol/L).  You get confused.  You have trouble: ? Thinking clearly. ? Breathing. Summary  Diabetes (diabetes mellitus) is a long-term (chronic) disease. It occurs when the body does not properly use sugar (glucose) that is released from food after digestion.  Take insulin and diabetes medicines as told.  Check your blood sugar every day, as often as told.  Keep all follow-up visits as told by your doctor. This is important. This information is not intended to replace advice given to you by your health care provider. Make sure you discuss any questions you have with your health care provider. Document Revised: 07/15/2019 Document Reviewed: 01/24/2018 Elsevier Patient Education  2020 [employer].

## 2020-09-19 NOTE — Progress Notes (Signed)
Patient ID: Kimberly Bryant, female    DOB: [birth year]  MRN: [id number]                                                       TRANSITION OF CARE VISIT   Primary Care Physician (PCP): Dr. Charlyne Petrin [facility]                                                   [address]                                                   [place], [place] [address]                                                   [place]  Date of Admission: 09/04/2020  Date of Discharge: 09/07/2020  Discharged from: [facility]  Discharge Diagnosis: HHS, hyperkalemia, coronary artery disease status post CABG in 2020, asymptomatic bacteriuria, hypertension, chronic diastolic CHF, obesity, chronic constipation   Summary of Admission: Patient presented to the emergency department with generalized weakness, polyuria, and malaise. Upon arrival to the ED: Patient afebrile, blood pressure 106/95, HR: 106, respiratory rate: 20, oxygen saturation 99% on room air, no leukocytosis, sodium: 139, potassium: 3.3, chloride: 102, CO2: 21, BUN: 24, blood glucose: 714, anion gap: 15, osmolality: 348. Patient was noted to be confused. Concern for HHS. Patient started on insulin GTT in ED and admitted for further evaluation and management.  HHS: - Likely in setting of newly diagnosed diabetes. A1C on admission more than 15%. Patient started on insulin GTT.  - Blood sugar - improved. Transitioned to sliding scale insulin, CBG every 4 hours and start patient on Lantus 20 units daily. - 09/06/2020 - blood sugar labile between 80's - 300's - Discussed with diabetes coordinator. DC sliding scale insulin. Started on 70/30 that night with 12 units at supper. Started 70/30 units twice daily from the next morning. Add Novolog 0 to 15 units 3 times daily and 0 to 5 units nightly.  - On the day of discharge. Patient's blood sugar improved.  - Discharged patient home on insulin 70/30 units twice  daily - Glucometer and test strips/lancets etc. Provided before discharge.   Hyperkalemia: - Potassium 5.6 - 2.9. Replenished. Repeat potassium level WNL.  Coronary Artery Disease Status Post CABG in 2020: - Patient denies ACS symptoms. Initial troponin 38 trended down to 28 - Continue Aspirin. Reviewed home meds. Not on beta blocker or statin.  Asymptomatic Bacteriuria: - Patient's urinalysis is positive for rare bacteria. Patient  denied urinary symptoms. Was not started on antibiotics.  Hypertension:  - Remained stable on Amlodipine  Chronic Diastolic CHF: - Patient appeared dry on admission.  - Lasix was held.  - Resume Lasix at time of discharge. - Follow-up with Cardiology as scheduled.   Obesity: - Diet modification/exercise and weight loss recommended.  Chronic Constipation: - Continued Colace.    TODAY's VISIT Patient/Caregiver self-reported problems/concerns: Denies and patient accompanied by her daughter Kimberly Bryant who also serves as interpreter and part historian.  Subjective: Kimberly Bryant is a 64 y.o. female with history of hypertension, coronary artery disease, acute coronary syndrome, hypertensive urgency, non-ST elevated myocardial infarction, GERD, hyperglycemia due to diabetes mellitus, hyperlipidemia, chest pain atypical, elevated troponin, s/p CABG x 4, and hyperkalemia who presents for Transition of Care Follow-Up.  Time since discharge: 12 days [facility] Diagnosis: HHS, hyperkalemia, coronary artery disease status post CABG in 2020, asymptomatic bacteriuria, hypertension, chronic diastolic CHF, obesity, chronic constipation  Procedures/tests: CT head wo contrast, diagnostic chest x-ray, respiratory panel, hemoglobin A1C, CBG, BMP, CBC, magnesium, HIV antibody, EKG, osmolality, troponin I, urinalysis, hepatic function panel, CK, I-stat venous blood gas Consultants: Diabetes Coordinator New medications: insulin  70/30 - 20 units twice daily with meals Discharge instructions: follow-up with PCP in 1 week, take insulin 70/30 - 20 units twice daily with meals, check blood sugar 4 times daily, advise carb consistent diet/exercise/weight loss, follow-up with Cardiology as scheduled, discuss about Metformin with PCP on follow-up visit  Status: better    HHS FOLLOW-UP: Last A1C: 15.5% on 09/07/2020 Are you fasting today: [x]  Yes []  No  Med Adherence:  [x]  Yes    []  No Medication side effects:  []  Yes    [x]  No Home Monitoring?  [x]  Yes    []  No Home glucose results range: 110's - 200's fasting Diet Adherence: []  Yes    [x]  No Exercise: []  Yes    [x]  No Hypoglycemic episodes?: []  Yes    [x]  No Numbness of the feet? []  Yes    [x]  No  Last eye exam: not sure   CORONARY ARTERY DISEASE FOLLOW-UP: 09/08/2020: Visit with cardiologist Dr. Mayford Knifeurner. No anginal symptoms since last visit. Continued on Aspirin, Statin, and beta blocker.    09/19/2020: Angina frequency: none Chest pain: no  Edema: no Orthopnea: no Paroxysmal nocturnal dyspnea: no Dyspnea on exertion: no Pneumovax:  would like vaccine today Aspirin: Denies, reports she is unaware that she is to take Aspirin  HYPERTENSION FOLLOW-UP: 05/18/2019: Visit with Dr. Jillyn HiddenFulp. Continued on Norvasc, Lisinopril, and Metoprolol.  06/07/2019: Patient status post emergency department visit on 06/07/2019 due to facial swelling which was thought to be due to her use of Lisinopril. Patient to come in the next 1 to 2 weeks to meet with clinical pharmacist to have blood pressure check and see if she needs an additional medication for blood pressure control after discontinuation of Lisinopril. Currently on Amlodipine and Metoprolol.  09/19/2020: Currently taking: see medication list Have you taken your blood pressure medication today: []  Yes []  No  Med Adherence: []  Yes    [x]  No, reports she is only taking Amlodipine. Reports she is unaware that she is to be taking  Metoprolol. Medication side effects: []  Yes    [x]  No Adherence with salt restriction: []  Yes    [x]  No Exercise: Yes []  No []  Home Monitoring?: [x]  Yes    []  No Monitoring Frequency: [x]  Yes    []  No Home BP results  range: [x]  Yes, 118-125/70's-80's Smoking []  Yes [x]  No SOB? []  Yes    [x]  No Chest Pain?: []  Yes    [x]  No Leg swelling?: []  Yes    [x]  No Headaches?: []  Yes    [x]  No Dizziness? []  Yes    [x]  No  HEART FAILURE FOLLOW-UP: 09/08/2020: Visit with cardiologist Dr. . Appeared euvolemic. Continued on Lasix.   09/19/2020: Currently taking:  See med list Taking ACEI/ARB?  no Taking beta blocker?  No, reports she is unaware that she is to take Metoprolol Med Adherence:  Reports she is unaware that she was to resume taking Lasix at hospital discharge. Says she was taken off a lot of medications after she had heart surgery. Says she is only taking 5 or 6 medications but she isn't sure which ones because she forgot her medication bag at home. Adherence with salt restriction:  denies Shortness of breath?  no Leg swelling?  no Monitoring weight at home?  no Last echocardiogram: 06/09/2019 Last ejection fraction: > 65% Followed by Cardiology?  yes Date last seen by Cardiology: 09/08/2020 and plans to follow-up in 1 year  CONSTIPATION FOLLOW-UP: Reports she is not taking Colace for unspecified reason. Has a bowel movement about every 5 days.  ASYMPTOMATIC BACTERURIA FOLLOW-UP: Denies any urinary symptoms during today's visit.   MEDICATIONS  Medication Reconciliation conducted with patient/caregiver? (Yes/ No): Yes  New medications prescribed/discontinued upon discharge? (Yes/No): Yes  Barriers identified related to medications: Communication barrier as patient/patient's daughter report that they are unaware of several listed medications in which patient should be taking.  LABS  Lab Reviewed (Yes/No/NA): Yes  PHYSICAL EXAM:  BP 138/84   Pulse 84   Resp 16   Ht 5'  1.5" (1.562 m)   Wt 207 lb 12.8 oz (94.3 kg)   SpO2 98%   BMI 38.63 kg/m   Physical Exam Constitutional:      Appearance: She is obese.  HENT:     Head: Normocephalic.  Eyes:     Extraocular Movements: Extraocular movements intact.     Conjunctiva/sclera: Conjunctivae normal.     Pupils: Pupils are equal, round, and reactive to light.  Cardiovascular:     Rate and Rhythm: Normal rate and regular rhythm.     Pulses: Normal pulses.     Heart sounds: Normal heart sounds.  Pulmonary:     Effort: Pulmonary effort is normal.     Breath sounds: Normal breath sounds.  Musculoskeletal:     Cervical back: Normal range of motion and neck supple.  Skin:    General: Skin is warm and dry.     Comments: Bilateral soles of feet with thickened callus without breaks in skin.  Neurological:     General: No focal deficit present.     Mental Status: She is alert and oriented to person, place, and time.  Psychiatric:        Mood and Affect: Mood normal.        Behavior: Behavior normal.   Diabetic foot exam was performed with the following findings:   No deformities, ulcerations, or other skin breakdown Intact posterior tibialis and dorsalis pedis pulses Bilateral feet decreased sensation with monofilament testing.     ASSESSMENT: 1. Hospital discharge follow-up: - Patient feeling better since hospital discharge.   2. Hyperosmolar hyperglycemic state (HHS) (HCC): 3. Type 2 diabetes mellitus with hyperosmolarity without coma, unspecified whether long term insulin use (HCC): - HHS likely in the setting of newly diagnosed diabetes. Was  on insulin drip while hospitalized. Consulted with diabetes coordinator during hospital admission. Patient discharged home on Insulin Aspart Protamine 70/30 20 units twice daily with a meal and orders for 11 refills.  - Continue  Insulin Aspart Protamine as prescribed.  - Fasting morning blood sugars are not consistent at home with ranges from 110's - 200's.  Fasting CBG today in clinic 147. Patient's daughter states her mom does have 190's - 200's fasting blood sugars quite often and checking blood sugars at least 4 times daily. Also reports her mother refuses to follow heart-healthy low carbohydrate diet. - Adding Metformin for additional management of diabetes.  - Follow-up with clinical pharmacist in 4 weeks for diabetes checkup. Write your home blood sugar results down each day and bring those results to your appointment along with your home glucose monitor. Medications may be adjusted at that time if needed. - To achieve an A1C goal of less than or equal to 7.0 percent, a fasting blood sugar of 80 to 130 mg/dL and a postprandial glucose (90 to 120 minutes after a meal) less than 180 mg/dL. In the event of sugars less than 60 mg/dl or greater than 854 mg/dl please notify the clinic ASAP. It is recommended that you undergo annual eye exams and annual foot exams. - Discussed the importance of healthy eating habits, low-carbohydrate diet, low-sugar diet, regular aerobic exercise (at least 150 minutes a week as tolerated) and medication compliance to achieve or maintain control of diabetes. - Microalbumin / creatinine urine ratio to check kidney function.  - Last BMP obtained 09/07/2020. - Last CBC obtained 09/07/2020. - Last hemoglobin A1C 15.5% on 09/07/2020. - Referral to Podiatry as patient has calluses on bilateral soles of feet that may need removal. - Referral to Ophthalmology for diabetic eye examination.  - Follow-up with primary physician in 3 months or sooner if needed. - Glucose (CBG) - Microalbumin / creatinine urine ratio - metFORMIN (GLUCOPHAGE) 500 MG tablet; Take 1 tablet (500 mg total) by mouth 2 (two) times daily with a meal.  Dispense: 60 tablet; Refill: 1 - Ambulatory referral to Podiatry - Ambulatory referral to Ophthalmology - Amb Referral to Clinical Pharmacist  4. Hyperkalemia: - Potassium 5.6 while hospitalized and repeat  potassium normal with and trended down to 2.9 before discharge.  5. Atherosclerosis of native coronary artery of native heart without angina pectoris: -  Visit with Cardiologist on 09/08/2020. No anginal symptoms since last visit. Continued on Aspirin, Statin, and beta blocker.   - Continue medications as prescribed.  - Keep all appointments with Cardiology. - Follow-up with primary physician as scheduled.  6. Hyperlipidemia, unspecified hyperlipidemia type: - Visit 09/08/2020 with Cardiologist.  - Continued on Crestor and Praluent.  - Keep all appointments with Cardiology. - Follow-up with primary physician as scheduled.  7. Asymptomatic bacteriuria: - No urinary symptoms today in clinic.   8. Essential hypertension: - Blood pressure at goal.  - Continue Amlodipine and Metoprolol as prescribed.  - Counseled on blood pressure goal of less than 130/80, low-sodium, DASH diet, medication compliance, 150 minutes of moderate intensity exercise per week as tolerated. Discussed medication compliance, adverse effects. - BMP last obtained 09/07/2020. - CBC last obtained 09/07/2020. - Follow-up with primary physician in 3 months or sooner if needed. - amLODipine (NORVASC) 5 MG tablet; Take 1 tablet (5 mg total) by mouth daily.  Dispense: 90 tablet; Refill: 0 - metoprolol succinate (TOPROL-XL) 25 MG 24 hr tablet; Take 1 tablet (25 mg total) by mouth daily.  Dispense: 90 tablet; Refill: 0  9. Chronic diastolic heart failure [facility]): - Visit with Cardiologist on 09/08/2020. Appeared euvolemic. Continued on Lasix.  - Continue Lasix as prescribed.  - Keep all appointments with Cardiology. - Follow-up with primary physician as scheduled.  10. Obesity with serious comorbidity, unspecified classification, unspecified obesity type: - Discussed the importance of heart healthy eating habits, low-carbohydrate diet, low-sugar diet, regular aerobic exercise and medication compliance to assist with weight  management.  11. Chronic constipation: - Today reports she never took Colace as prescribed. Reporting a bowel movement once every 5 days.  - Follow-up with primary physician as needed. - Patient Education  -Increase water and fiber intake with natural foods such as apples, pears, cherries, raisins, grapes, and nuts. -Fiber supplements purchased over-the-counter such as Metamucil or Benefiber can improve symptoms of constipation. -Suppositories purchased over-the-counter such as glycerin or bisacodyl may be considered if no relief from fiber supplements.  -Encouraged patient to not use laxatives excessively as this may contribute to rebound constipation, hypokalemia, and salt depletion -Counseled patient to try to defecate after meals thereby taking advantage of normal postprandial increases in colonic motility  12. Influenza vaccination declined: - Patient declined.  13. Need for prophylactic vaccination against Streptococcus pneumoniae (pneumococcus): - Administered during today's visit.  14. Language barrier: - Patient's daughter Kimberly Bryant participated during today's visit as part historian and interpreter.   PATIENT EDUCATION PROVIDED: See AVS   FOLLOW-UP (Include any further testing or referrals):

## 2020-09-19 NOTE — Progress Notes (Signed)
Patient presents for vaccination against strep pneumo per orders of Dr. Jillyn Hidden. Consent given. Counseling provided. No contraindications exists. Vaccine administered without incident.   Of note, pt is uninsured. Will coordinate patient assistance with pharmacy.  Butch Penny, PharmD, CPP Clinical Pharmacist [facility] [phone]

## 2020-09-20 LAB — MICROALBUMIN / CREATININE URINE RATIO
Creatinine, Urine: 158.3 mg/dL
Microalb/Creat Ratio: 627 mg/g creat — ABNORMAL HIGH (ref 0–29)
Microalbumin, Urine: 992.5 ug/mL

## 2020-10-17 ENCOUNTER — Ambulatory Visit: Payer: Self-pay | Admitting: Pharmacist

## 2020-10-20 ENCOUNTER — Other Ambulatory Visit: Payer: Self-pay | Admitting: Family Medicine

## 2020-10-20 ENCOUNTER — Telehealth: Payer: Self-pay | Admitting: Cardiology

## 2020-10-20 NOTE — Telephone Encounter (Signed)
Pt's daughter called in to request a refill for Insulin Pen Needle (PEN NEEDLES) 32G X 4 MM MISC    Pharmacy:  [facility] [place] [address] [place], [place] - [address] Phone:  [phone]  Fax:  [phone]

## 2020-10-20 NOTE — Telephone Encounter (Addendum)
Requested medication (s) are due for refill today: Yes  Requested medication (s) are on the active medication list: Yes  Last refill:  09/2020   Future visit scheduled: Yes  Notes to clinic: Patient requesting new pharmacy. Unable to refill per protocol, last refilled by another provider     Requested Prescriptions  Pending Prescriptions Disp Refills   Insulin Pen Needle (PEN NEEDLES) 32G X 4 MM MISC 100 each 11    Sig: Use twice daily with Novolog      Endocrinology: Diabetes - Testing Supplies Passed - 10/20/2020  4:18 PM      Passed - Valid encounter within last 12 months    Recent Outpatient Visits           1 month ago Need for vaccination for Strep pneumoniae   [facility] Lois Huxley, Cornelius Moras, RPH-CPP   1 month ago Hospital discharge follow-up   [facility] [place], [place], NP   1 year ago Chest pain, atypical   [facility] Fulp, [place], MD   1 year ago Chest pain, atypical   [facility] [place], [place], MD   1 year ago Coronary artery disease involving native heart without angina pectoris, unspecified vessel or lesion type   [facility] Cain Saupe, MD       Future Appointments             In 3 weeks Lois Huxley, Cornelius Moras, RPH-CPP [facility]   In 2 months Claiborne Rigg, NP [facility]

## 2020-10-20 NOTE — Telephone Encounter (Signed)
Called pt and spoke with pt's daughter informing her that she needed to contact pt's PCP for refill for pt's insulin needles and if she has any cardiac problems, questions or concerns, to give our office a call back. Daughter verbalized understanding.

## 2020-10-20 NOTE — Telephone Encounter (Signed)
*  STAT* If patient is at the pharmacy, call can be transferred to refill team.   1. Which medications need to be refilled? (please list name of each medication and dose if known)  Insulin needles 2. Which pharmacy/location (including street and city if local pharmacy) is medication to be sent to? [facility]   3. Do they need a 30 day or 90 day supply? Not sure

## 2020-10-24 MED ORDER — PEN NEEDLES 32G X 4 MM MISC: 11 refills | Status: DC

## 2020-11-14 ENCOUNTER — Other Ambulatory Visit: Payer: Self-pay | Admitting: Family Medicine

## 2020-11-14 ENCOUNTER — Other Ambulatory Visit: Payer: Self-pay

## 2020-11-14 ENCOUNTER — Encounter: Payer: Self-pay | Admitting: Pharmacist

## 2020-11-14 ENCOUNTER — Ambulatory Visit: Payer: Self-pay | Attending: Family Medicine | Admitting: Pharmacist

## 2020-11-14 DIAGNOSIS — E1100 Type 2 diabetes mellitus with hyperosmolarity without nonketotic hyperglycemic-hyperosmolar coma (NKHHC): Secondary | ICD-10-CM

## 2020-11-14 DIAGNOSIS — E785 Hyperlipidemia, unspecified: Secondary | ICD-10-CM

## 2020-11-14 DIAGNOSIS — I10 Essential (primary) hypertension: Secondary | ICD-10-CM

## 2020-11-14 LAB — GLUCOSE, POCT (MANUAL RESULT ENTRY): POC Glucose: 275 mg/dl — AB (ref 70–99)

## 2020-11-14 MED ORDER — METFORMIN HCL 500 MG PO TABS: 1000.0000 mg | ORAL_TABLET | Freq: Two times a day (BID) | ORAL | 2 refills | Status: DC

## 2020-11-14 MED ORDER — METOPROLOL SUCCINATE ER 25 MG PO TB24: 25.0000 mg | ORAL_TABLET | Freq: Every day | ORAL | 2 refills | Status: DC

## 2020-11-14 MED ORDER — ROSUVASTATIN CALCIUM 40 MG PO TABS: 40.0000 mg | ORAL_TABLET | Freq: Every day | ORAL | 2 refills | Status: DC

## 2020-11-14 MED ORDER — BASAGLAR KWIKPEN 100 UNIT/ML ~~LOC~~ SOPN: 15.0000 [IU] | PEN_INJECTOR | Freq: Every day | SUBCUTANEOUS | 2 refills | Status: DC

## 2020-11-14 MED ORDER — PEN NEEDLES 32G X 4 MM MISC: 11 refills | Status: DC

## 2020-11-14 MED ORDER — AMLODIPINE BESYLATE 5 MG PO TABS: 5.0000 mg | ORAL_TABLET | Freq: Every day | ORAL | 2 refills | Status: DC

## 2020-11-14 MED ORDER — TRUE METRIX METER W/DEVICE KIT: PACK | 0 refills | Status: DC

## 2020-11-14 MED ORDER — TRUEPLUS LANCETS 28G MISC: 2 refills | Status: DC

## 2020-11-14 MED ORDER — TRUE METRIX BLOOD GLUCOSE TEST VI STRP: ORAL_STRIP | 2 refills | Status: DC

## 2020-11-14 MED FILL — !LANTUS SOLOSTAR 100UNITS/M: 100 | 20 days supply | Qty: 3 | Fill #0

## 2020-11-14 MED FILL — ROSUVASTATIN CALCIUM 40 MG: 40 | 30 days supply | Qty: 30 | Fill #0

## 2020-11-14 MED FILL — METOPROLOL SUCCINATE ER 25: 25 | 30 days supply | Qty: 30 | Fill #0

## 2020-11-14 MED FILL — METFORMIN HCL 500 MG TABS: 500 | 30 days supply | Qty: 120 | Fill #0

## 2020-11-14 MED FILL — TRUE METRIX GLUCOSE TEST ST: 50 days supply | Qty: 50 | Fill #0

## 2020-11-14 MED FILL — AMLODIPINE BESYLATE 5 MG TA: 5 | 30 days supply | Qty: 30 | Fill #0

## 2020-11-14 MED FILL — !TRUE METRIX BLOOD GLUCOSE: 30 days supply | Qty: 1 | Fill #0

## 2020-11-14 MED FILL — TRUEplus LANCETS 28G MISC: 90 days supply | Qty: 100 | Fill #0

## 2020-11-14 MED FILL — TRUEplus 5-BEVEL PEN NEEDLE: 32G X 4 MM | 90 days supply | Qty: 100 | Fill #0

## 2020-11-14 NOTE — Patient Instructions (Addendum)
Thank you for coming to see me today. Please do the following:  1. Stop Novolog 70/30. 2. Start Basaglar. Inject 15 units once a day at bedtime.  3. Start taking 2 tablets of metformin in the morning after breakfast and 2 tablets in the evening after dinner.  4. Continue all other medications.  5. Continue checking blood sugars at home.  6. Continue making the lifestyle changes we've discussed together during our visit. Diet and exercise play a significant role in improving your blood sugars.  7. Follow-up with [name] on 12/19/2020.   Hypoglycemia or low blood sugar:   Low blood sugar can happen quickly and may become an emergency if not treated right away.   While this shouldn't happen often, it can be brought upon if you skip a meal or do not eat enough. Also, if your insulin or other diabetes medications are dosed too high, this can cause your blood sugar to go to low.   Warning signs of low blood sugar include: 1. Feeling shaky or dizzy 2. Feeling weak or tired  3. Excessive hunger 4. Feeling anxious or upset  5. Sweating even when you aren't exercising  What to do if I experience low blood sugar? 1. Check your blood sugar with your meter. If lower than 70, proceed to step 2.  2. Treat with 3-4 glucose tablets or 3 packets of regular sugar. If these aren't around, you can try hard candy. Yet another option would be to drink 4 ounces of fruit juice or 6 ounces of REGULAR soda.  3. Re-check your sugar in 15 minutes. If it is still below 70, do what you did in step 2 again. If has come back up, go ahead and eat a snack or small meal at this time.

## 2020-11-14 NOTE — Progress Notes (Signed)
    S:    PCP: Bertram Denver   No chief complaint on file.  Patient arrives in good spirits. Presents for diabetes evaluation, education, and management. Patient was referred and last seen by Ricky Stabs on 09/19/2020. Metformin was added to her regimen at that visit.   HPI:  Pt hosp 10/31 - 09/07/20 with HHS in the setting of newly dx diabetes. A1c found to be >15%. Pt improved on insulin gtt and then SSI + Lantus 20 units daily. Pt was discharged on aspart 70/30.  Patient reports diabetes was diagnosed in Oct, 2021.   Family/Social History:  - FHx: HTN - Tobacco: never smoker  - Alcohol: never   Insurance coverage/medication affordability: none  Medication adherence denied. Has been without insulin for ~ 1 month.    Current diabetes medications include: metformin 500 mg BID, Novolog 70/30 20u BID (pt takes 10u BID but has been without insulin for ~1 month) Current hypertension medications include: amlodipine 5 mg daily, Toprol XL 25 mg daily Current hyperlipidemia medications include: rosuvastatin 40 mg daily   Patient denies hypoglycemic events.  Patient reported dietary habits:  - Admits to a diet high in plantains, potatoes, noodles   Patient-reported exercise habits: none    Patient reported polyuria. Patient reports neuropathy (nerve pain). Patient denies visual changes. Patient reports self foot exams.    O:  POCT: 235  Lab Results  Component Value Date   HGBA1C 15.5 (H) 09/06/2020   There were no vitals filed for this visit.  Lipid Panel     Component Value Date/Time   CHOL 138 08/24/2019 1110   TRIG 81 08/24/2019 1110   HDL 46 08/24/2019 1110   CHOLHDL 3.0 08/24/2019 1110   LDLCALC 76 08/24/2019 1110   Home fasting blood sugars: 140-150s.    Clinical Atherosclerotic Cardiovascular Disease (ASCVD): Yes  The ASCVD Risk score Denman George DC Jr., et al., 2013) failed to calculate for the following reasons:   The patient has a prior MI or stroke diagnosis     A/P: Diabetes longstanding currently uncontrolled. Patient is able to verbalize appropriate hypoglycemia management plan. Medication adherence denied as she has been without insulin for ~1 month. She wants an insulin product that she can inject once daily. Control is suboptimal due to dietary indiscretion and physical inactivity.  With ACS/CAD hx, pt would benefit from GLP-1 RA or SGLT-2 inhibitor therapy. In the future, I recommend Jardiance or Victoza for this patient.   -Discontinued Novolog 70/30. -Start Basaglar 15 units daily. -Increased metformin to 1000 mg BID.  -True [employer] supplies sent -Extensively discussed pathophysiology of diabetes, recommended lifestyle interventions, dietary effects on blood sugar control -Counseled on s/sx of and management of hypoglycemia -Next A1C anticipated 12/2020.   ASCVD risk - secondary prevention in patient with diabetes. Last LDL is above goal. High intensity statin indicated. Aspirin is indicated.  -Continued aspirin. -Continued rosuvastatin 40 mg.   Written patient instructions provided.  Total time in face to face counseling 30 minutes.   Follow up PCP Clinic Visit next month.     Butch Penny, PharmD, [place]y [place], CPP Clinical Pharmacist [facility] [phone]

## 2020-12-19 ENCOUNTER — Ambulatory Visit: Payer: Self-pay | Admitting: Nurse Practitioner

## 2020-12-19 ENCOUNTER — Ambulatory Visit: Payer: Self-pay | Admitting: Family Medicine

## 2020-12-22 MED FILL — AMLODIPINE BESYLATE 5 MG TA: 5 | 30 days supply | Qty: 30 | Fill #1

## 2020-12-22 MED FILL — ROSUVASTATIN CALCIUM 40 MG: 40 | 30 days supply | Qty: 30 | Fill #1

## 2020-12-22 MED FILL — METOPROLOL SUCCINATE ER 25: 25 | 30 days supply | Qty: 30 | Fill #1

## 2021-01-23 MED FILL — METOPROLOL SUCCINATE ER 25: 25 | 30 days supply | Qty: 30 | Fill #2

## 2021-01-23 MED FILL — ROSUVASTATIN CALCIUM 40 MG: 40 | 30 days supply | Qty: 30 | Fill #2

## 2021-01-23 MED FILL — AMLODIPINE BESYLATE 5 MG TA: 5 | 30 days supply | Qty: 30 | Fill #2

## 2021-01-23 MED FILL — METFORMIN HCL 500 MG TABS: 500 | 30 days supply | Qty: 120 | Fill #1

## 2021-02-01 ENCOUNTER — Telehealth: Payer: Self-pay | Admitting: Nurse Practitioner

## 2021-02-01 ENCOUNTER — Ambulatory Visit: Payer: Self-pay | Admitting: Nurse Practitioner

## 2021-02-01 MED FILL — ROSUVASTATIN CALCIUM 40 MG: 40 | 30 days supply | Qty: 30 | Fill #2

## 2021-02-01 MED FILL — METOPROLOL SUCCINATE ER 25: 25 | 30 days supply | Qty: 30 | Fill #2

## 2021-02-01 MED FILL — AMLODIPINE BESYLATE 5 MG TA: 5 | 30 days supply | Qty: 30 | Fill #2

## 2021-02-01 NOTE — Telephone Encounter (Signed)
Called patient using interpreter services and LVM advising patient that her appointment had been cancelled due to the provider having an e,ergency and being out of the office. Advised patient to call [phone] to reschedule.

## 2021-02-27 ENCOUNTER — Encounter: Payer: Self-pay | Admitting: Nurse Practitioner

## 2021-02-27 ENCOUNTER — Other Ambulatory Visit: Payer: Self-pay

## 2021-02-27 ENCOUNTER — Ambulatory Visit: Payer: Self-pay | Attending: Nurse Practitioner | Admitting: Nurse Practitioner

## 2021-02-27 VITALS — BP 162/85 | HR 71 | Resp 18 | Ht 65.0 in | Wt 217.0 lb

## 2021-02-27 DIAGNOSIS — I5032 Chronic diastolic (congestive) heart failure: Secondary | ICD-10-CM

## 2021-02-27 DIAGNOSIS — E1165 Type 2 diabetes mellitus with hyperglycemia: Secondary | ICD-10-CM

## 2021-02-27 DIAGNOSIS — Z1159 Encounter for screening for other viral diseases: Secondary | ICD-10-CM

## 2021-02-27 DIAGNOSIS — Z1211 Encounter for screening for malignant neoplasm of colon: Secondary | ICD-10-CM

## 2021-02-27 DIAGNOSIS — D649 Anemia, unspecified: Secondary | ICD-10-CM

## 2021-02-27 DIAGNOSIS — Z794 Long term (current) use of insulin: Secondary | ICD-10-CM

## 2021-02-27 DIAGNOSIS — I10 Essential (primary) hypertension: Secondary | ICD-10-CM

## 2021-02-27 DIAGNOSIS — E785 Hyperlipidemia, unspecified: Secondary | ICD-10-CM

## 2021-02-27 LAB — POCT GLYCOSYLATED HEMOGLOBIN (HGB A1C): HbA1c, POC (controlled diabetic range): 7.7 % — AB (ref 0.0–7.0)

## 2021-02-27 LAB — GLUCOSE, POCT (MANUAL RESULT ENTRY): POC Glucose: 147 mg/dl — AB (ref 70–99)

## 2021-02-27 MED ORDER — INSULIN ASPART PROT & ASPART (70-30 MIX) 100 UNIT/ML PEN
10.0000 [IU] | PEN_INJECTOR | Freq: Two times a day (BID) | SUBCUTANEOUS | 3 refills | Status: DC
Start: 2021-02-27 — End: 2021-04-24
  Filled 2021-02-27: qty 3, 30d supply, fill #0

## 2021-02-27 MED ORDER — GLUCOSE BLOOD VI STRP
ORAL_STRIP | Freq: Every day | 2 refills | Status: DC
Start: 2021-02-27 — End: 2021-04-24
  Filled 2021-02-27: qty 100, 30d supply, fill #0

## 2021-02-27 MED ORDER — AMLODIPINE BESYLATE 5 MG PO TABS
5.0000 mg | ORAL_TABLET | Freq: Every day | ORAL | 0 refills | Status: DC
  Filled 2021-02-27: qty 30, 30d supply, fill #0
  Filled 2021-05-02: qty 30, 30d supply, fill #1

## 2021-02-27 MED ORDER — FUROSEMIDE 20 MG PO TABS
20.0000 mg | ORAL_TABLET | Freq: Every day | ORAL | 0 refills | Status: DC
  Filled 2021-02-27: qty 30, 30d supply, fill #0

## 2021-02-27 MED ORDER — BASAGLAR KWIKPEN 100 UNIT/ML ~~LOC~~ SOPN
15.0000 [IU] | PEN_INJECTOR | Freq: Every day | SUBCUTANEOUS | 2 refills | Status: DC
  Filled 2021-02-27: qty 6, 40d supply, fill #0

## 2021-02-27 MED ORDER — ROSUVASTATIN CALCIUM 40 MG PO TABS
40.0000 mg | ORAL_TABLET | Freq: Every day | ORAL | 0 refills | Status: DC
  Filled 2021-02-27: qty 30, 30d supply, fill #0

## 2021-02-27 NOTE — Progress Notes (Signed)
Assessment & Plan:  Kimberly Bryant was seen today for diabetes and hypertension.  Diagnoses and all orders for this visit:  Type 2 diabetes mellitus with hyperglycemia, with long-term current use of insulin (HCC) -     POCT glycosylated hemoglobin (Hb A1C) -     POCT glucose (manual entry) -     glucose blood test strip; USE TO CHECK BLOOD SUGAR ONCE DAILY. -     Insulin Aspart Prot & Aspart (INSULIN ASP PROT & ASP FLEXPEN) (70-30) 100 UNIT/ML SUPN; Inject 10 Units into the skin in the morning and at bedtime. -     Insulin Glargine (BASAGLAR KWIKPEN) 100 UNIT/ML; Inject 15 Units into the skin daily.  Dyslipidemia, goal LDL below 70 -     Lipid panel -     rosuvastatin (CRESTOR) 40 MG tablet; Take 1 tablet (40 mg total) by mouth daily.  Primary hypertension -     CMP14+EGFR -     amLODipine (NORVASC) 5 MG tablet; Take 1 tablet (5 mg total) by mouth daily.  Chronic diastolic heart failure (HCC)  Anemia, unspecified type -     CBC  Colon cancer screening -     Fecal occult blood, imunochemical([facility])  Need for hepatitis C screening test -     HCV Ab w Reflex to Quant PCR -     Cancel: HCV Ab w Reflex to Quant PCR    Patient has been counseled on age-appropriate routine health concerns for screening and prevention. These are reviewed and up-to-date. Referrals have been placed accordingly. Immunizations are up-to-date or declined.    Subjective:   Chief Complaint  Patient presents with  . Diabetes  . Hypertension   HPI Kimberly Bryant 65 y.o. female presents to office today for follow up. She is accompanied by her niece who is interpreting for her.   She has a past medical history of CAD (coronary artery disease), Hyperlipidemia, Hypertension, and Pleural effusion (06/24/2019).  Essential Hypertension Not well controlled. States she did not sleep well last night. Denies chest pain, shortness of breath, palpitations, lightheadedness, dizziness, headaches or BLE edema.  Endorses adherence with amlodipine 5 mg daily and metoprolol xl 25 mg daily.  BP Readings from Last 3 Encounters:  02/27/21 (!) 162/85  09/19/20 138/84  09/08/20 118/80    DM 2 Improved. She is taking metformin 500 mg BID, novolog 10 units BID and basglar 15 units daily. Denies any symptoms of hypo or hyperglycemia. LDL at [place] with crestor 40 mg daily.  Lab Results  Component Value Date   HGBA1C 7.7 (A) 02/27/2021   Lab Results  Component Value Date   HGBA1C 15.5 (H) 09/06/2020    Lab Results  Component Value Date   LDLCALC 66 02/27/2021    Review of Systems  Constitutional: Negative for fever, malaise/fatigue and weight loss.  HENT: Negative.  Negative for nosebleeds.   Eyes: Negative.  Negative for blurred vision, double vision and photophobia.  Respiratory: Negative.  Negative for cough and shortness of breath.   Cardiovascular: Negative.  Negative for chest pain, palpitations and leg swelling.  Gastrointestinal: Negative.  Negative for heartburn, nausea and vomiting.  Musculoskeletal: Negative.  Negative for myalgias.  Neurological: Negative.  Negative for dizziness, focal weakness, seizures and headaches.  Psychiatric/Behavioral: Negative.  Negative for suicidal ideas.    Past Medical History:  Diagnosis Date  . CAD (coronary artery disease)    status post acute NSTEMI 05/2019 followed by CABG x4 with a LIMA to the  LAD, SVG to OM, SVG to diagonal, SVG to distal LAD.   Marland Kitchen Hyperlipidemia   . Hypertension   . Pleural effusion 06/24/2019   LEFT    Past Surgical History:  Procedure Laterality Date  . APPLICATION OF WOUND VAC N/A 06/23/2019   Procedure: APPLICATION OF WOUND VAC;  Surgeon: Wonda Olds, MD;  [facility];  Service: Thoracic;  Laterality: N/A;  . CORONARY ARTERY BYPASS GRAFT N/A 06/10/2019   Procedure: CORONARY ARTERY BYPASS GRAFTING (CABG) x Four, using left internal mammary artery and right leg greater saphenous vein harvested endoscopically;   Surgeon: Wonda Olds, MD;  Location: [place];  Service: Open Heart Surgery;  Laterality: N/A;  . LEFT HEART CATH AND CORONARY ANGIOGRAPHY N/A 06/09/2019   Procedure: LEFT HEART CATH AND CORONARY ANGIOGRAPHY;  Surgeon: Troy Sine, MD;  Location: [place] [facility];  Service: Cardiovascular;  Laterality: N/A;  . STERNAL WOUND DEBRIDEMENT N/A 06/23/2019   Procedure: STERNAL WOUND DEBRIDEMENT;  Surgeon: Wonda Olds, MD;  [facility];  Service: Thoracic;  Laterality: N/A;  . TEE WITHOUT CARDIOVERSION N/A 06/10/2019   Procedure: TRANSESOPHAGEAL ECHOCARDIOGRAM (TEE);  Surgeon: Wonda Olds, MD;  Location: [place];  Service: Open Heart Surgery;  Laterality: N/A;    Family History  Problem Relation Age of Onset  . Hypertension Mother     Social History Reviewed with no changes to be made today.   Outpatient Medications Prior to Visit  Medication Sig Dispense Refill  . acetaminophen (TYLENOL) 325 MG tablet Take 650 mg by mouth every 6 (six) hours as needed for mild pain or moderate pain.     Marland Kitchen aspirin 325 MG tablet Take 325 mg by mouth daily.    . Blood Glucose Monitoring Suppl (TRUE METRIX METER) w/Device KIT Use to check blood sugar once daily. 1 kit 0  . Insulin Pen Needle (PEN NEEDLES) 32G X 4 MM MISC Use once daily with Basaglar 100 each 11  . Insulin Pen Needle 32G X 4 MM MISC USE AS DIRECTED 100 each 0  . metFORMIN (GLUCOPHAGE) 500 MG tablet TAKE 2 TABLETS (1,000 MG TOTAL) BY MOUTH 2 (TWO) TIMES DAILY WITH A MEAL. 120 tablet 2  . metoprolol succinate (TOPROL-XL) 25 MG 24 hr tablet TAKE 1 TABLET (25 MG TOTAL) BY MOUTH DAILY. 30 tablet 2  . Potassium Bicarb-Citric Acid (EFFER-K) 10 MEQ TBEF Take 10 mEq by mouth daily.    . TRUEplus Lancets 28G MISC USE TO CHECK BLOOD SUGAR ONCE DAILY. 100 each 2  . amLODipine (NORVASC) 5 MG tablet TAKE 1 TABLET (5 MG TOTAL) BY MOUTH DAILY. 30 tablet 2  . furosemide (LASIX) 20 MG tablet Take 20 mg by mouth.    Marland Kitchen glucose blood test strip USE TO  CHECK BLOOD SUGAR ONCE DAILY. 100 strip 2  . Insulin Aspart Prot & Aspart (INSULIN ASP PROT & ASP FLEXPEN) (70-30) 100 UNIT/ML SUPN INJECT 20 UNITS TOTAL INTO THE SKIN TWO TIMES DAILY WITH A MEAL. 15 mL 11  . Insulin Glargine (BASAGLAR KWIKPEN) 100 UNIT/ML Inject 15 Units into the skin daily. 6 mL 2  . insulin glargine (LANTUS) 100 UNIT/ML Solostar Pen INJECT 15 UNITS INTO THE SKIN DAILY. 6 mL 2  . Insulin Pen Needle 32G X 4 MM MISC USE ONCE DAILY WITH BASAGLAR 100 each 11  . rosuvastatin (CRESTOR) 40 MG tablet TAKE 1 TABLET (40 MG TOTAL) BY MOUTH DAILY. 30 tablet 2   No facility-administered medications prior to visit.  Allergies  Allergen Reactions  . Atorvastatin Other (See Comments)    Made body feel flushed and hot all over  . Carafate [Sucralfate] Swelling and Other (See Comments)    Patient stated it made her face swell (angioedema)- no breathing issues, however  . Lisinopril Swelling and Other (See Comments)    Patient stated it made her face swell (angioedema)- no breathing issues, however       Objective:    BP (!) 162/85   Pulse 71   Resp 18   Ht _0  (1.651 m)   Wt 217 lb (98.4 kg)   SpO2 98%   BMI 36.11 kg/m  Wt Readings from Last 3 Encounters:  02/27/21 217 lb (98.4 kg)  09/19/20 207 lb 12.8 oz (94.3 kg)  09/08/20 208 lb 12.8 oz (94.7 kg)    Physical Exam Vitals and nursing note reviewed.  Constitutional:      Appearance: She is well-developed.  HENT:     Head: Normocephalic and atraumatic.  Cardiovascular:     Rate and Rhythm: Normal rate and regular rhythm.     Heart sounds: Normal heart sounds. No murmur heard. No friction rub. No gallop.   Pulmonary:     Effort: Pulmonary effort is normal. No tachypnea or respiratory distress.     Breath sounds: Normal breath sounds. No decreased breath sounds, wheezing, rhonchi or rales.  Chest:     Chest wall: No tenderness.  Abdominal:     General: Bowel sounds are normal.     Palpations: Abdomen is soft.   Musculoskeletal:        General: Normal range of motion.     Cervical back: Normal range of motion.  Skin:    General: Skin is warm and dry.  Neurological:     Mental Status: She is alert and oriented to person, place, and time.     Coordination: Coordination normal.  Psychiatric:        Behavior: Behavior normal. Behavior is cooperative.        Thought Content: Thought content normal.        Judgment: Judgment normal.          Patient has been counseled extensively about nutrition and exercise as well as the importance of adherence with medications and regular follow-up. The patient was given clear instructions to go to ER or return to medical center if symptoms don't improve, worsen or new problems develop. The patient verbalized understanding.   Follow-up: Return for PAP SMEAR.   Gildardo Pounds, FNP-BC [facility] and [facility] [place], [place]   02/27/2021, 11:14 AM

## 2021-02-28 ENCOUNTER — Other Ambulatory Visit: Payer: Self-pay

## 2021-02-28 LAB — CBC
Hematocrit: 38.9 % (ref 34.0–46.6)
Hemoglobin: 12.3 g/dL (ref 11.1–15.9)
MCH: 27.9 pg (ref 26.6–33.0)
MCHC: 31.6 g/dL (ref 31.5–35.7)
MCV: 88 fL (ref 79–97)
Platelets: 273 10*3/uL (ref 150–450)
RBC: 4.41 x10E6/uL (ref 3.77–5.28)
RDW: 12.9 % (ref 11.7–15.4)
WBC: 6.4 10*3/uL (ref 3.4–10.8)

## 2021-02-28 LAB — LIPID PANEL
Chol/HDL Ratio: 2.9 ratio (ref 0.0–4.4)
Cholesterol, Total: 120 mg/dL (ref 100–199)
HDL: 42 mg/dL (ref >39)
LDL Chol Calc (NIH): 66 mg/dL (ref 0–99)
Triglycerides: 55 mg/dL (ref 0–149)
VLDL Cholesterol Cal: 12 mg/dL (ref 5–40)

## 2021-02-28 LAB — CMP14+EGFR
ALT: 21 IU/L (ref 0–32)
AST: 11 IU/L (ref 0–40)
Albumin/Globulin Ratio: 1.7 (ref 1.2–2.2)
Albumin: 4.3 g/dL (ref 3.8–4.8)
Alkaline Phosphatase: 69 IU/L (ref 44–121)
BUN/Creatinine Ratio: 11 — ABNORMAL LOW (ref 12–28)
BUN: 11 mg/dL (ref 8–27)
Bilirubin Total: <0.2 mg/dL (ref 0.0–1.2)
CO2: 22 mmol/L (ref 20–29)
Calcium: 9.2 mg/dL (ref 8.7–10.3)
Chloride: 105 mmol/L (ref 96–106)
Creatinine, Ser: 1.01 mg/dL — ABNORMAL HIGH (ref 0.57–1.00)
Globulin, Total: 2.5 g/dL (ref 1.5–4.5)
Glucose: 139 mg/dL — ABNORMAL HIGH (ref 65–99)
Potassium: 4.1 mmol/L (ref 3.5–5.2)
Sodium: 142 mmol/L (ref 134–144)
Total Protein: 6.8 g/dL (ref 6.0–8.5)
eGFR: 62 mL/min/{1.73_m2} (ref >59)

## 2021-02-28 LAB — HCV AB W REFLEX TO QUANT PCR: HCV Ab: 0.2 s/co ratio (ref 0.0–0.9)

## 2021-02-28 LAB — HCV INTERPRETATION

## 2021-03-01 ENCOUNTER — Other Ambulatory Visit: Payer: Self-pay

## 2021-03-01 ENCOUNTER — Encounter: Payer: Self-pay | Admitting: Nurse Practitioner

## 2021-03-01 MED ORDER — ASPIRIN 325 MG PO TABS
325.0000 mg | ORAL_TABLET | Freq: Every day | ORAL | 3 refills | Status: DC
  Filled 2021-03-01: qty 90, 90d supply, fill #0

## 2021-03-02 ENCOUNTER — Other Ambulatory Visit: Payer: Self-pay

## 2021-03-02 ENCOUNTER — Telehealth: Payer: Self-pay

## 2021-03-02 NOTE — Telephone Encounter (Signed)
Attempted to call pt no answer lvm to return call.  

## 2021-03-02 NOTE — Telephone Encounter (Signed)
Pt informed of lab results verbalized understanding and voiced no other concerns.  

## 2021-03-02 NOTE — Telephone Encounter (Signed)
-----   Message from Claiborne Rigg, NP sent at 03/01/2021  9:15 PM EDT ----- Hepatitis C is negative. Cholesterol levels are normal. CBC does not indicate any anemia or bleeding disorders. Kidney, liver function and electrolytes are normal.

## 2021-03-02 NOTE — Telephone Encounter (Signed)
-----   Message from [name] W Fleming, NP sent at 03/01/2021  9:15 PM EDT ----- Hepatitis C is negative. Cholesterol levels are normal. CBC does not indicate any anemia or bleeding disorders. Kidney, liver function and electrolytes are normal.   

## 2021-04-24 ENCOUNTER — Other Ambulatory Visit: Payer: Self-pay

## 2021-04-24 ENCOUNTER — Other Ambulatory Visit: Payer: Self-pay | Admitting: Nurse Practitioner

## 2021-04-24 ENCOUNTER — Other Ambulatory Visit (HOSPITAL_BASED_OUTPATIENT_CLINIC_OR_DEPARTMENT_OTHER): Payer: Self-pay

## 2021-04-24 DIAGNOSIS — E1165 Type 2 diabetes mellitus with hyperglycemia: Secondary | ICD-10-CM

## 2021-04-24 DIAGNOSIS — E785 Hyperlipidemia, unspecified: Secondary | ICD-10-CM

## 2021-04-24 DIAGNOSIS — I10 Essential (primary) hypertension: Secondary | ICD-10-CM

## 2021-04-24 DIAGNOSIS — E1100 Type 2 diabetes mellitus with hyperosmolarity without nonketotic hyperglycemic-hyperosmolar coma (NKHHC): Secondary | ICD-10-CM

## 2021-04-24 DIAGNOSIS — Z794 Long term (current) use of insulin: Secondary | ICD-10-CM

## 2021-04-24 DIAGNOSIS — I5032 Chronic diastolic (congestive) heart failure: Secondary | ICD-10-CM

## 2021-04-24 MED ORDER — ASPIRIN 325 MG PO TABS
325.0000 mg | ORAL_TABLET | Freq: Every day | ORAL | 0 refills | Status: AC
  Filled 2021-04-24: qty 90, 90d supply, fill #0

## 2021-04-24 MED ORDER — METFORMIN HCL 500 MG PO TABS
ORAL_TABLET | Freq: Two times a day (BID) | ORAL | 0 refills | Status: DC
  Filled 2021-04-24 – 2021-05-02 (×2): qty 120, 30d supply, fill #0

## 2021-04-24 MED ORDER — INSULIN ASPART PROT & ASPART (70-30 MIX) 100 UNIT/ML PEN
10.0000 [IU] | PEN_INJECTOR | Freq: Two times a day (BID) | SUBCUTANEOUS | 0 refills | Status: DC
  Filled 2021-04-24: qty 3, 15d supply, fill #0
  Filled 2021-05-02: qty 6, 30d supply, fill #0

## 2021-04-24 MED ORDER — FUROSEMIDE 20 MG PO TABS
20.0000 mg | ORAL_TABLET | Freq: Every day | ORAL | 0 refills | Status: DC
  Filled 2021-04-24 – 2021-05-02 (×2): qty 30, 30d supply, fill #0

## 2021-04-24 MED ORDER — BASAGLAR KWIKPEN 100 UNIT/ML ~~LOC~~ SOPN
15.0000 [IU] | PEN_INJECTOR | Freq: Every day | SUBCUTANEOUS | 0 refills | Status: DC
  Filled 2021-04-24: qty 3, 20d supply, fill #0
  Filled 2021-05-02: qty 6, 40d supply, fill #0

## 2021-04-24 MED ORDER — ROSUVASTATIN CALCIUM 40 MG PO TABS
40.0000 mg | ORAL_TABLET | Freq: Every day | ORAL | 0 refills | Status: DC
  Filled 2021-04-24 – 2021-05-02 (×2): qty 30, 30d supply, fill #0

## 2021-04-24 MED ORDER — GLUCOSE BLOOD VI STRP
ORAL_STRIP | Freq: Every day | 0 refills | Status: DC
  Filled 2021-04-24: qty 50, 50d supply, fill #0
  Filled 2021-05-02: qty 100, 100d supply, fill #0

## 2021-04-24 MED ORDER — METOPROLOL SUCCINATE ER 25 MG PO TB24
ORAL_TABLET | Freq: Every day | ORAL | 0 refills | Status: DC
  Filled 2021-04-24 – 2021-05-02 (×2): qty 30, 30d supply, fill #0

## 2021-04-24 NOTE — Telephone Encounter (Signed)
Medication Refill - Medication:  -furosemide (LASIX) 20 MG tablet  -amLODipine (NORVASC) 5 MG tablet  -rosuvastatin (CRESTOR) 40 MG tablet  -Insulin Glargine (BASAGLAR KWIKPEN) 100 UNIT/ML  -insulin aspart protamine - aspart (NOVOLOG 70/30 MIX) (70-30) 100 UNIT/ML FlexPen -glucose blood test strip -aspirin 325 MG tablet   Has the patient contacted their pharmacy? No. Pt states she has lost of her medications and was needing them refilled  Preferred Pharmacy (with phone number or street name): [facility]  Phone:  [phone] Fax:  [phone]  Agent: Please be advised that RX refills may take up to 3 business days. We ask that you follow-up with your pharmacy.

## 2021-04-25 IMAGING — CR CHEST - 2 VIEW
2 series · 2 of 2 positions shown · non-contrast
Comparison: [id number]

CLINICAL DATA: Pleural effusion

EXAM:
CHEST - 2 VIEW

[chest lat]
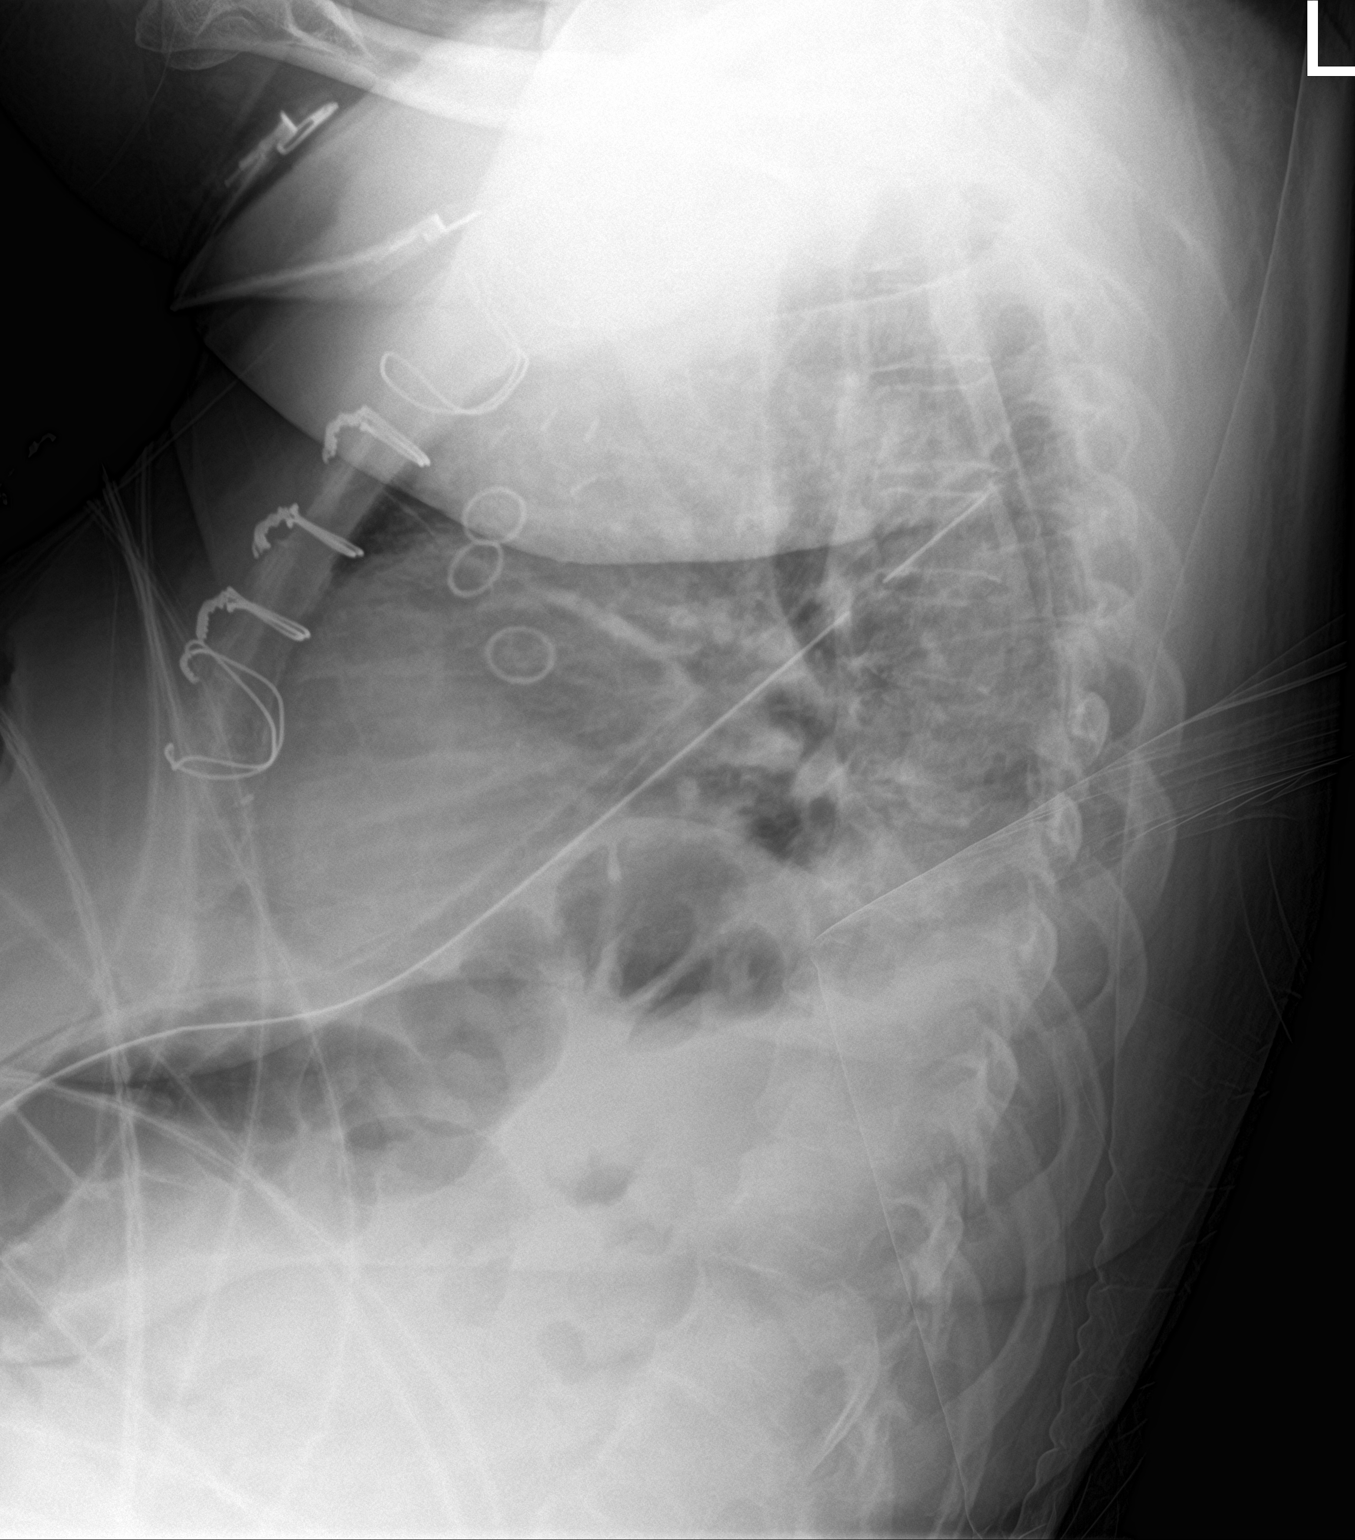

[chest ap]
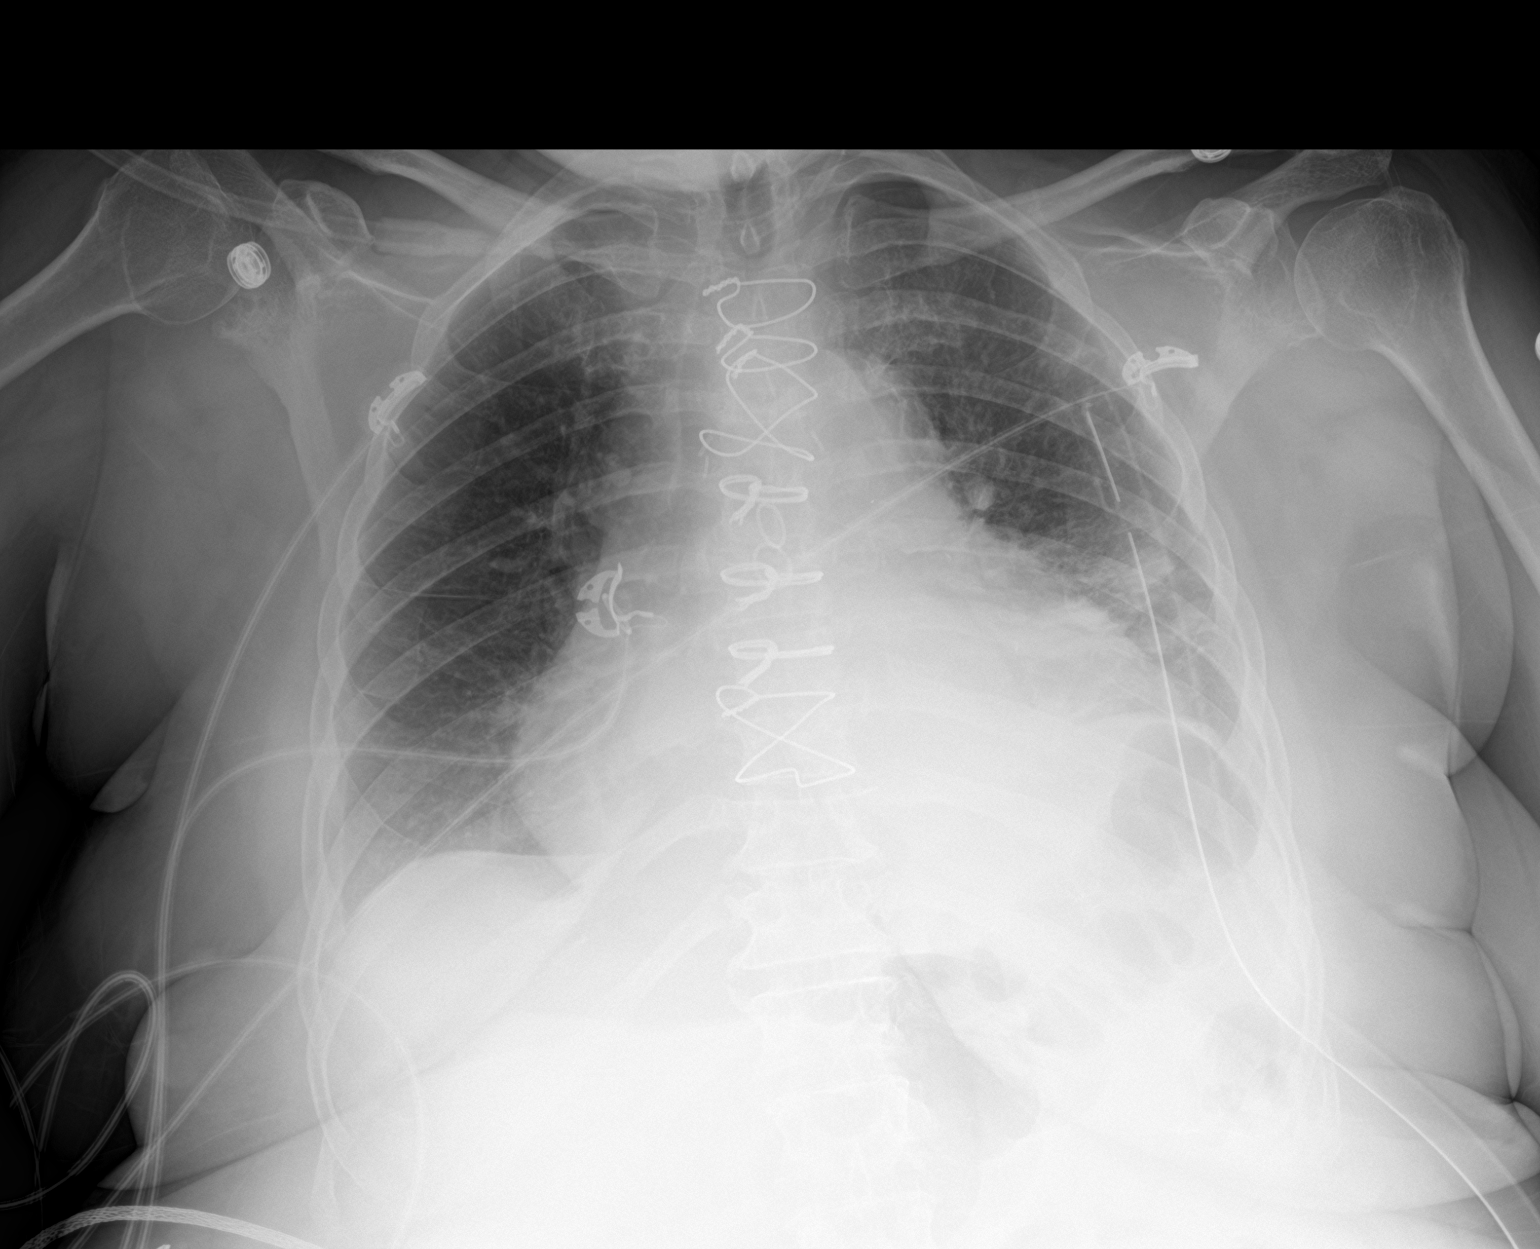

[2 of 2 positions shown; findings below may reference images not displayed]

FINDINGS: Stable LEFT thoracostomy tube.

Enlargement of cardiac silhouette post CABG.

Mediastinal contours and pulmonary vascularity normal.

Persistent LEFT basilar atelectasis.

Significantly improved aeration RIGHT lung.

Tiny LEFT apical pneumothorax.

No definite residual LEFT pleural effusion identified.
IMPRESSION: Stable LEFT thoracostomy tube with persistent LEFT basilar
atelectasis and tiny LEFT apical pneumothorax.

Significantly improved aeration RIGHT lung.

Enlargement of cardiac silhouette post CABG.

## 2021-05-01 ENCOUNTER — Other Ambulatory Visit: Payer: Self-pay

## 2021-05-02 ENCOUNTER — Other Ambulatory Visit: Payer: Self-pay | Admitting: Nurse Practitioner

## 2021-05-02 ENCOUNTER — Other Ambulatory Visit: Payer: Self-pay

## 2021-05-02 DIAGNOSIS — E785 Hyperlipidemia, unspecified: Secondary | ICD-10-CM

## 2021-05-02 MED FILL — Lancets: 100 days supply | Qty: 100 | Fill #0 | Status: AC

## 2021-05-02 NOTE — Telephone Encounter (Unsigned)
Copied from CRM [id number]. Topic: Quick Communication - Rx Refill/Question >> May 02, 2021 11:26 AM Gaetana Michaelis A wrote: Medication:  -furosemide (LASIX) 20 MG tablet  -amLODipine (NORVASC) 5 MG tablet  -rosuvastatin (CRESTOR) 40 MG tablet  -Insulin Glargine (BASAGLAR KWIKPEN) 100 UNIT/ML  -insulin aspart protamine - aspart (NOVOLOG 70/30 MIX) (70-30) 100 UNIT/ML FlexPen -glucose blood test strip -aspirin 325 MG tablet   Has the patient contacted their pharmacy? No. (Agent: If no, request that the patient contact the pharmacy for the refill.) (Agent: If yes, when and what did the pharmacy advise?)  Preferred Pharmacy (with phone number or street name): [facility]  Phone:  [phone] Fax:  [phone]  Agent: Please be advised that RX refills may take up to 3 business days. We ask that you follow-up with your pharmacy.

## 2021-05-05 ENCOUNTER — Other Ambulatory Visit: Payer: Self-pay

## 2021-05-26 IMAGING — DX DG CHEST 2V
2 series · 2 of 2 positions shown · non-contrast
Comparison: July 10, 2019

CLINICAL DATA: Status post recent coronary artery bypass grafting

EXAM:
CHEST - 2 VIEW

[dg chest 2 view (1 of 2)]
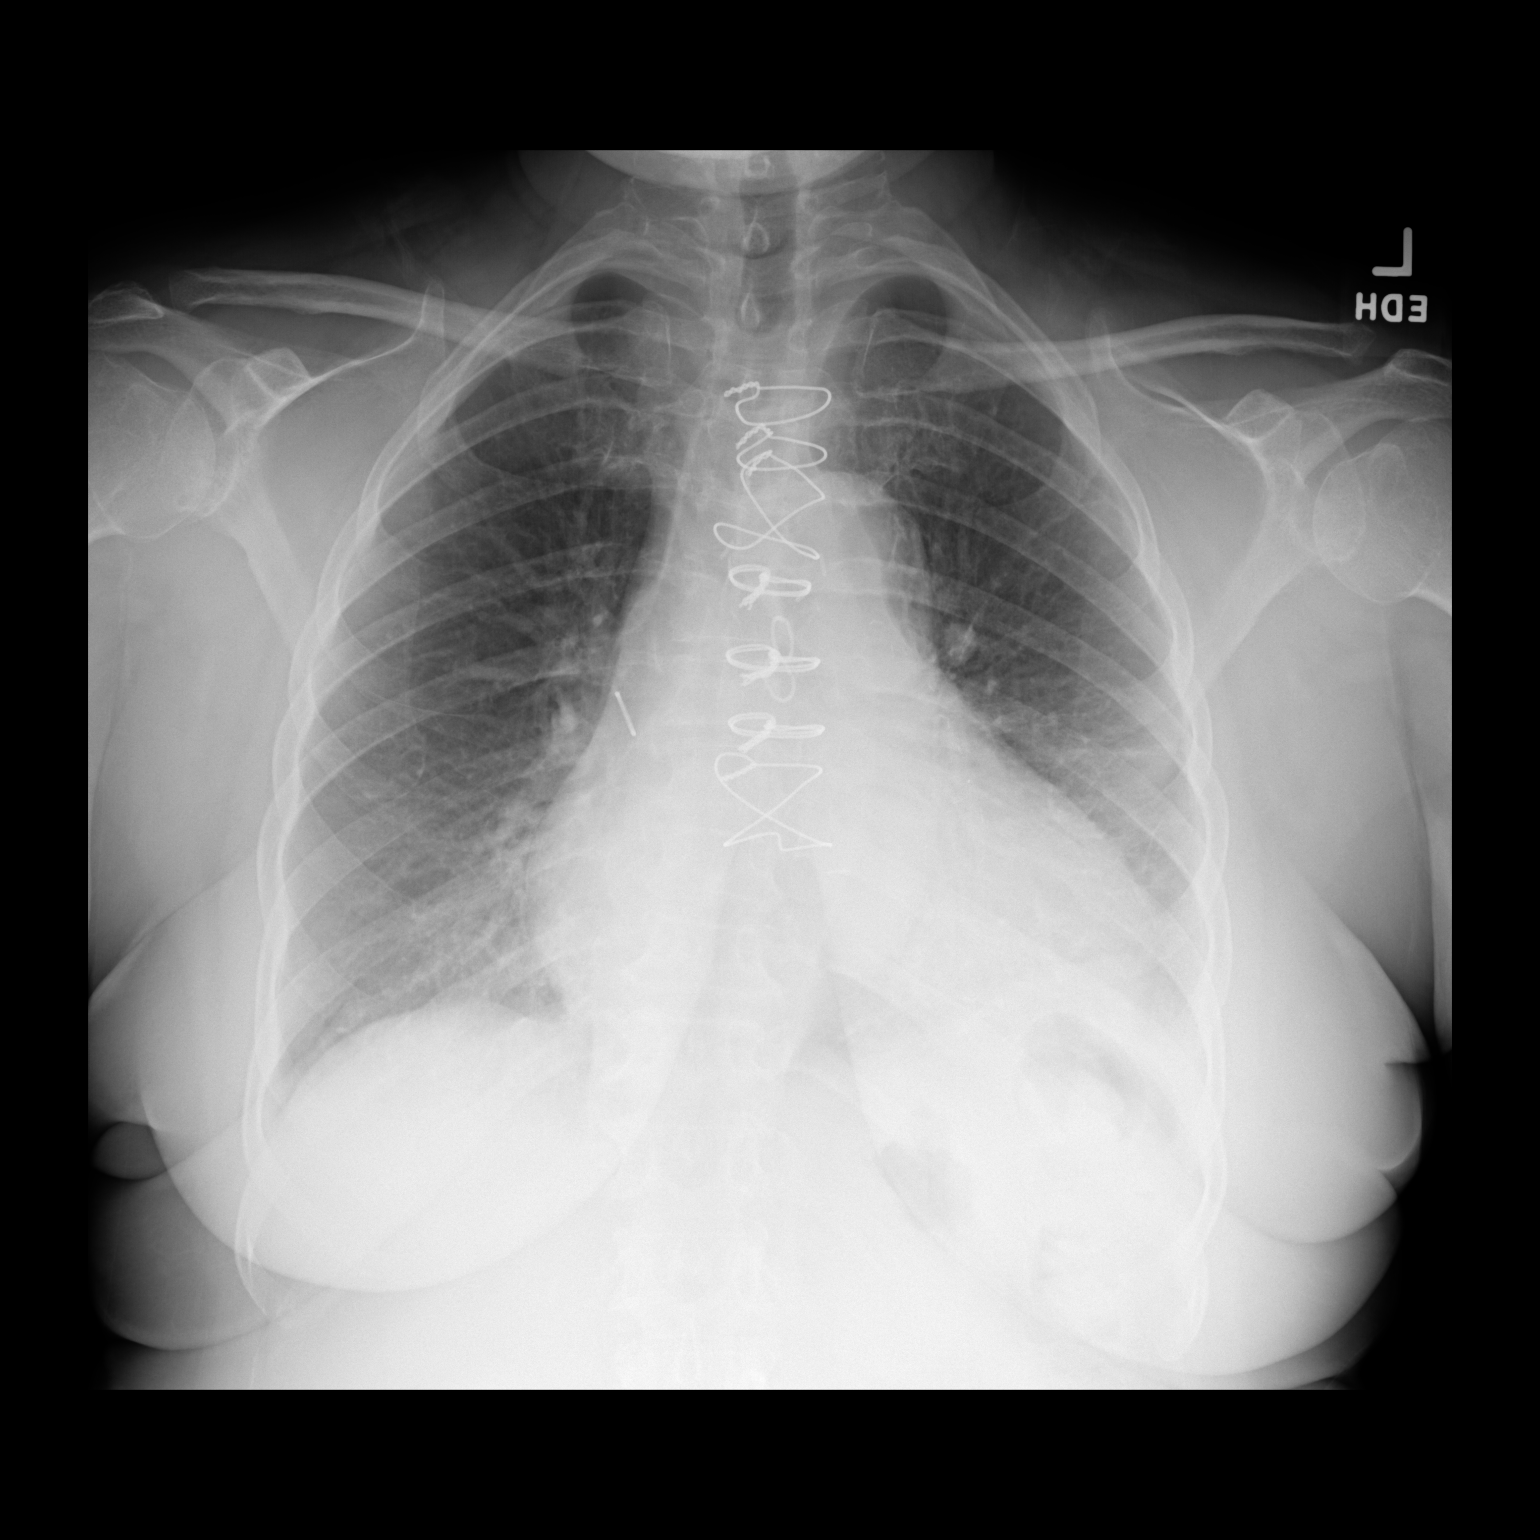

[dg chest 2 view (2 of 2)]
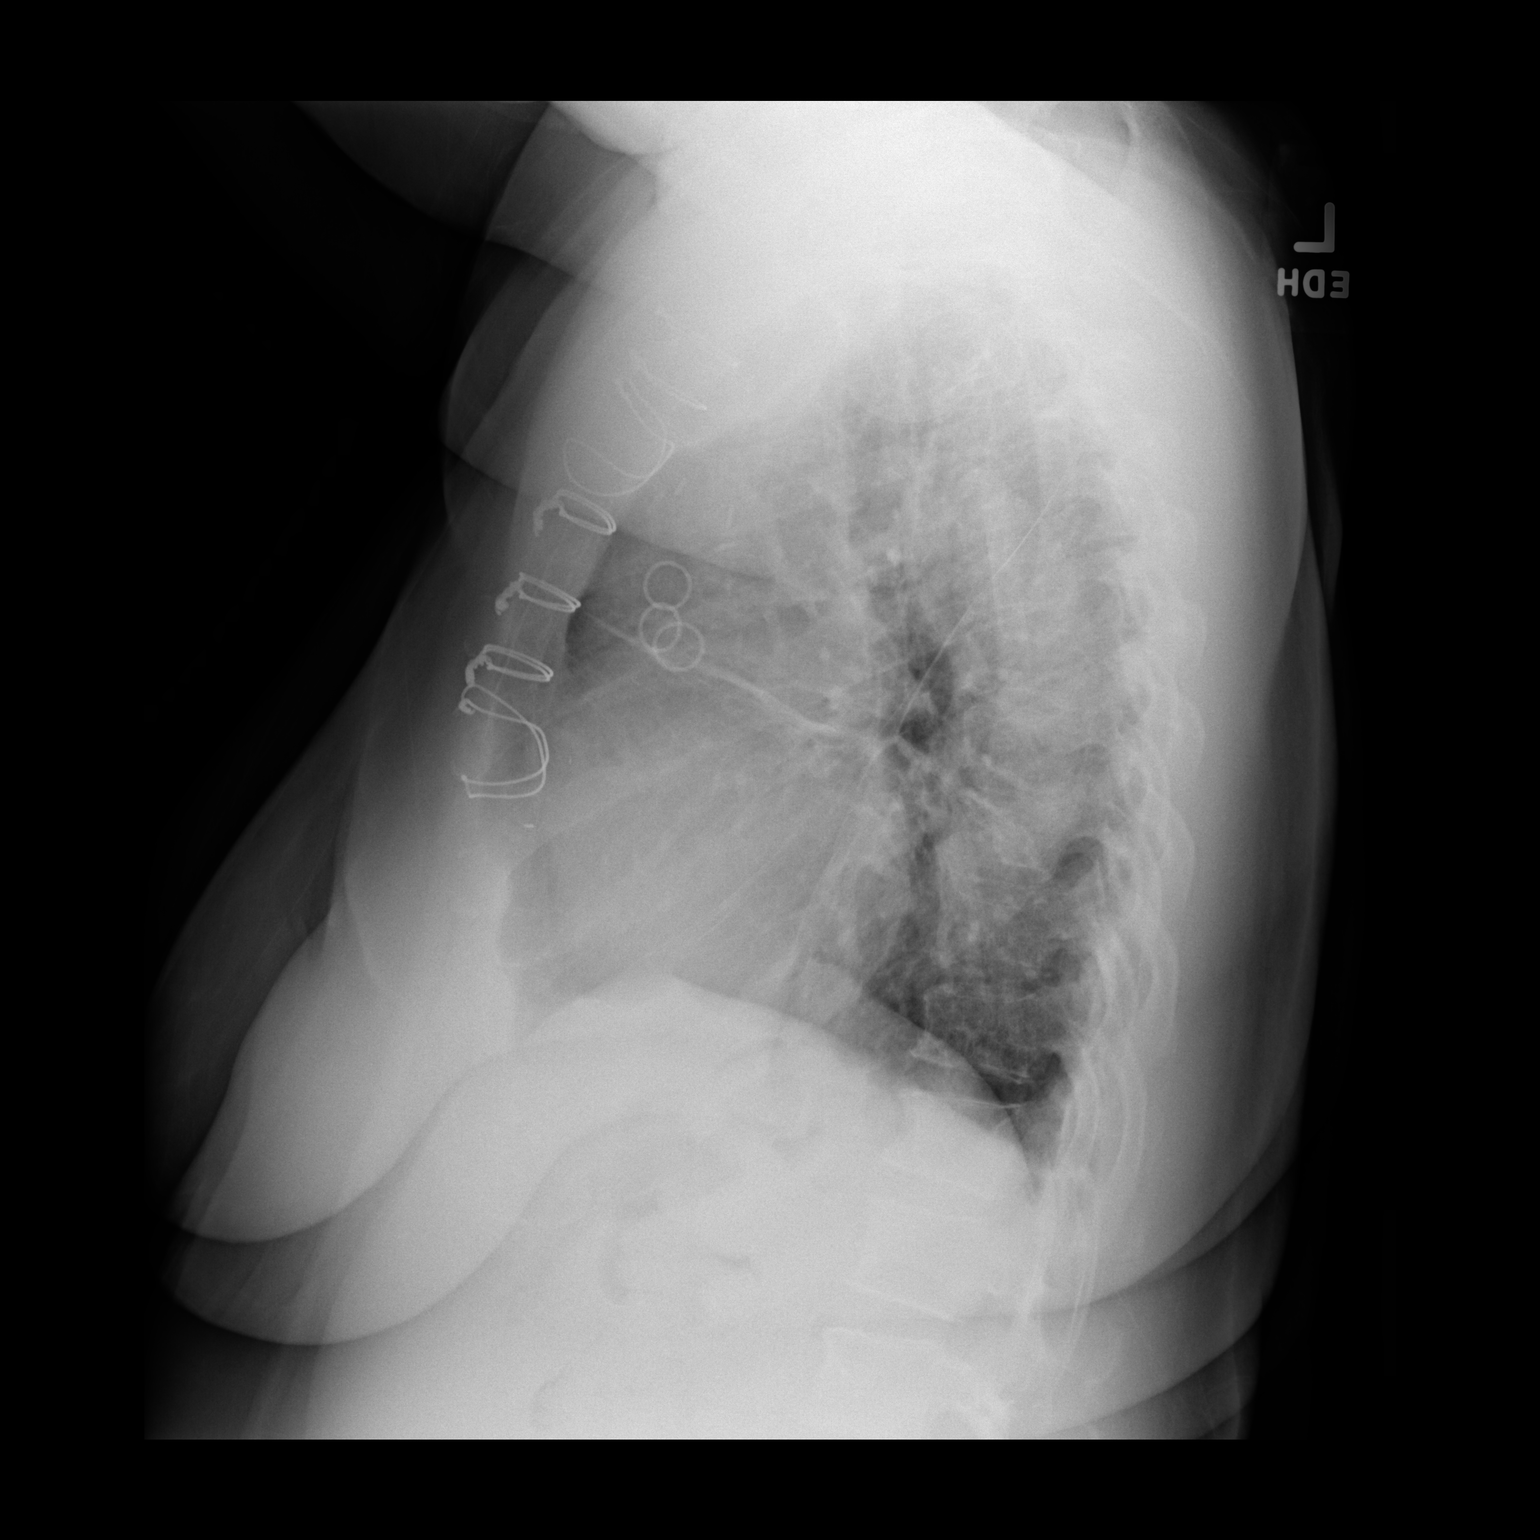

[2 of 2 positions shown; findings below may reference images not displayed]

FINDINGS: There is a persistent small left pleural effusion with atelectatic
change in the left base region. Lungs elsewhere are clear. Heart is
enlarged with pulmonary vascularity normal. No adenopathy. Patient
is status post median sternotomy with coronary artery bypass
grafting. No adenopathy. No pneumothorax. No bone lesions.
IMPRESSION: Rather minimal left pleural effusion with left lower lobe region
atelectatic change. Lungs elsewhere clear. Stable cardiomegaly and
postoperative change.

## 2021-05-29 ENCOUNTER — Other Ambulatory Visit: Payer: Self-pay

## 2021-05-29 ENCOUNTER — Other Ambulatory Visit: Payer: Self-pay | Admitting: Nurse Practitioner

## 2021-05-29 ENCOUNTER — Other Ambulatory Visit (HOSPITAL_COMMUNITY)
Admission: RE | Admit: 2021-05-29 | Discharge: 2021-05-29 | Disposition: A | Discharge status: Home / Self Care | Payer: Self-pay | Source: Ambulatory Visit | Attending: Nurse Practitioner | Admitting: Nurse Practitioner

## 2021-05-29 ENCOUNTER — Encounter: Payer: Self-pay | Admitting: Nurse Practitioner

## 2021-05-29 ENCOUNTER — Ambulatory Visit: Payer: Self-pay | Attending: Nurse Practitioner | Admitting: Nurse Practitioner

## 2021-05-29 VITALS — BP 161/90 | HR 62 | Resp 16 | Wt 209.0 lb

## 2021-05-29 DIAGNOSIS — E1165 Type 2 diabetes mellitus with hyperglycemia: Secondary | ICD-10-CM

## 2021-05-29 DIAGNOSIS — Z1231 Encounter for screening mammogram for malignant neoplasm of breast: Secondary | ICD-10-CM

## 2021-05-29 DIAGNOSIS — Z01419 Encounter for gynecological examination (general) (routine) without abnormal findings: Secondary | ICD-10-CM

## 2021-05-29 DIAGNOSIS — Z794 Long term (current) use of insulin: Secondary | ICD-10-CM

## 2021-05-29 DIAGNOSIS — I10 Essential (primary) hypertension: Secondary | ICD-10-CM

## 2021-05-29 DIAGNOSIS — Z114 Encounter for screening for human immunodeficiency virus [HIV]: Secondary | ICD-10-CM

## 2021-05-29 MED ORDER — AMLODIPINE BESYLATE 5 MG PO TABS
5.0000 mg | ORAL_TABLET | Freq: Every day | ORAL | 0 refills | Status: DC
  Filled 2021-05-29: qty 30, 30d supply, fill #0

## 2021-05-29 MED ORDER — METFORMIN HCL 500 MG PO TABS
ORAL_TABLET | Freq: Two times a day (BID) | ORAL | 0 refills | Status: DC
  Filled 2021-05-29: qty 120, 30d supply, fill #0

## 2021-05-29 MED ORDER — FUROSEMIDE 20 MG PO TABS
20.0000 mg | ORAL_TABLET | Freq: Every day | ORAL | 1 refills | Status: DC
  Filled 2021-05-29: qty 30, 30d supply, fill #0
  Filled 2021-07-23 – 2021-08-01 (×2): qty 30, 30d supply, fill #1

## 2021-05-29 MED ORDER — BASAGLAR KWIKPEN 100 UNIT/ML ~~LOC~~ SOPN
15.0000 [IU] | PEN_INJECTOR | Freq: Every day | SUBCUTANEOUS | 0 refills | Status: DC
  Filled 2021-05-29: qty 6, 40d supply, fill #0

## 2021-05-29 MED ORDER — METOPROLOL SUCCINATE ER 25 MG PO TB24
25.0000 mg | ORAL_TABLET | Freq: Every day | ORAL | 1 refills | Status: DC
  Filled 2021-05-29: qty 30, 30d supply, fill #0
  Filled 2021-07-23 – 2021-08-01 (×2): qty 30, 30d supply, fill #1

## 2021-05-29 MED ORDER — AMLODIPINE BESYLATE 10 MG PO TABS
10.0000 mg | ORAL_TABLET | Freq: Every day | ORAL | 0 refills | Status: DC
  Filled 2021-05-29: qty 30, 30d supply, fill #0
  Filled 2021-07-23 – 2021-08-01 (×2): qty 90, 90d supply, fill #0

## 2021-05-29 MED ORDER — TRUE METRIX METER W/DEVICE KIT
PACK | 0 refills | Status: AC
  Filled 2021-05-29: qty 1, 30d supply, fill #0

## 2021-05-29 NOTE — Telephone Encounter (Signed)
   Notes to clinic:  Verify directions for refill   Requested Prescriptions  Pending Prescriptions Disp Refills   glucose blood (TRUE METRIX BLOOD GLUCOSE TEST) test strip 100 strip 0    Sig: USE TO CHECK BLOOD SUGAR ONCE DAILY.      Endocrinology: Diabetes - Testing Supplies Passed - 05/29/2021 11:23 AM      Passed - Valid encounter within last 12 months    Recent Outpatient Visits           Today Well woman exam with routine gynecological exam   [facility] [place], [place] W, NP   3 months ago Type 2 diabetes mellitus with hyperglycemia, with long-term current use of insulin [facility])   [facility] [place], Shea Stakes, NP   6 months ago Hyperlipidemia, unspecified hyperlipidemia type   [facility] [place] Huxley, Cornelius Moras, RPH-CPP   8 months ago Need for vaccination for Strep pneumoniae   [facility] Lois Huxley, Cornelius Moras, RPH-CPP   8 months ago Hospital discharge follow-up   [facility] Rema Fendt, NP       Future Appointments             In 3 months Claiborne Rigg, NP [facility]

## 2021-05-29 NOTE — Progress Notes (Addendum)
Assessment & Plan:  Kimberly Bryant was seen today for gynecologic exam.  Diagnoses and all orders for this visit:  Well woman exam with routine gynecological exam -     Cervicovaginal ancillary only -     Cytology - PAP -     MM DIGITAL SCREENING BILATERAL; Future  Breast cancer screening by mammogram Referred to [facility]  Type 2 diabetes mellitus with hyperosmolarity without coma, unspecified whether long term insulin use (HCC) -     Blood Glucose Monitoring Suppl (TRUE METRIX METER) w/Device KIT; Use to check blood sugar once daily. -     Basic metabolic panel Continue blood sugar control as discussed in office today, low carbohydrate diet, and regular physical exercise as tolerated, 150 minutes per week (30 min each day, 5 days per week, or 50 min 3 days per week). Keep blood sugar logs with fasting goal of 90-130 mg/dl, post prandial (after you eat) less than 180.  For Hypoglycemia: BS <60 and Hyperglycemia BS >400; contact the clinic ASAP. Annual eye exams and foot exams are recommended.   Encounter for screening for HIV -     HIV antibody (with reflex)    Patient has been counseled on age-appropriate routine health concerns for screening and prevention. These are reviewed and up-to-date. Referrals have been placed accordingly. Immunizations are up-to-date or declined.    Subjective:   Chief Complaint  Patient presents with   Gynecologic Exam   HPI Kimberly Bryant 65 y.o. female presents to office today for PAP smear and breast exam.  She has a past medical history of CAD, Hyperlipidemia, Hypertension, and Pleural effusion (06/24/2019). She is out of test strips today. Needs refills and also a new glucometer.   DM not well controlled. Currently prescribed basaglar 15 units daily, metformin 500 mg BID. May add farxiga based on next a1c level. LDL at goal with rosuvastatin 68m daily. Blood pressure not well controlled today. Will increase to amlodipine 10 mg daily.  Lab Results   Component Value Date   HGBA1C 7.7 (A) 02/27/2021    Lab Results  Component Value Date   LDLCALC 66 02/27/2021    BP Readings from Last 3 Encounters:  05/29/21 (!) 161/90  02/27/21 (!) 162/85  09/19/20 138/84    Review of Systems  Constitutional:  Negative for fever, malaise/fatigue and weight loss.  HENT: Negative.  Negative for nosebleeds.   Eyes: Negative.  Negative for blurred vision, double vision and photophobia.  Respiratory: Negative.  Negative for cough and shortness of breath.   Cardiovascular: Negative.  Negative for chest pain, palpitations and leg swelling.  Gastrointestinal: Negative.  Negative for heartburn, nausea and vomiting.  Musculoskeletal: Negative.  Negative for myalgias.  Neurological: Negative.  Negative for dizziness, focal weakness, seizures and headaches.  Psychiatric/Behavioral: Negative.  Negative for suicidal ideas.    Past Medical History:  Diagnosis Date   CAD (coronary artery disease)    status post acute NSTEMI 05/2019 followed by CABG x4 with a LIMA to the LAD, SVG to OM, SVG to diagonal, SVG to distal LAD.    Hyperlipidemia    Hypertension    Pleural effusion 06/24/2019   LEFT    Past Surgical History:  Procedure Laterality Date   APPLICATION OF WOUND VAC N/A 06/23/2019   Procedure: APPLICATION OF WOUND VAC;  Surgeon: AWonda Olds MD;  Location: [place]  Service: Thoracic;  Laterality: N/A;   CORONARY ARTERY BYPASS GRAFT N/A 06/10/2019   Procedure: CORONARY ARTERY BYPASS GRAFTING (  CABG) x Four, using left internal mammary artery and right leg greater saphenous vein harvested endoscopically;  Surgeon: Wonda Olds, MD;  Location: [place];  Service: Open Heart Surgery;  Laterality: N/A;   LEFT HEART CATH AND CORONARY ANGIOGRAPHY N/A 06/09/2019   Procedure: LEFT HEART CATH AND CORONARY ANGIOGRAPHY;  Surgeon: Troy Sine, MD;  Location: [place] [facility];  Service: Cardiovascular;  Laterality: N/A;   STERNAL WOUND DEBRIDEMENT N/A  06/23/2019   Procedure: STERNAL WOUND DEBRIDEMENT;  Surgeon: Wonda Olds, MD;  [facility];  Service: Thoracic;  Laterality: N/A;   TEE WITHOUT CARDIOVERSION N/A 06/10/2019   Procedure: TRANSESOPHAGEAL ECHOCARDIOGRAM (TEE);  Surgeon: Wonda Olds, MD;  Location: [place];  Service: Open Heart Surgery;  Laterality: N/A;    Family History  Problem Relation Age of Onset   Hypertension Mother     Social History Reviewed with no changes to be made today.   Outpatient Medications Prior to Visit  Medication Sig Dispense Refill   acetaminophen (TYLENOL) 325 MG tablet Take 650 mg by mouth every 6 (six) hours as needed for mild pain or moderate pain.      aspirin 325 MG tablet Take 1 tablet (325 mg total) by mouth daily. 90 tablet 0   glucose blood test strip USE TO CHECK BLOOD SUGAR ONCE DAILY. 100 strip 0   Insulin Pen Needle (PEN NEEDLES) 32G X 4 MM MISC Use once daily with Basaglar 100 each 11   rosuvastatin (CRESTOR) 40 MG tablet Take 1 tablet (40 mg total) by mouth daily. 90 tablet 0   TRUEplus Lancets 28G MISC USE TO CHECK BLOOD SUGAR ONCE DAILY. 100 each 2   amLODipine (NORVASC) 5 MG tablet Take 1 tablet (5 mg total) by mouth daily. 90 tablet 0   Blood Glucose Monitoring Suppl (TRUE METRIX METER) w/Device KIT Use to check blood sugar once daily. 1 kit 0   furosemide (LASIX) 20 MG tablet Take 1 tablet (20 mg total) by mouth daily. 30 tablet 0   insulin aspart protamine - aspart (NOVOLOG 70/30 MIX) (70-30) 100 UNIT/ML FlexPen Inject 0.1 mLs (10 Units total) into the skin in the morning and at bedtime. 15 mL 0   Insulin Glargine (BASAGLAR KWIKPEN) 100 UNIT/ML Inject 15 Units into the skin daily. 6 mL 0   Insulin Pen Needle 32G X 4 MM MISC USE AS DIRECTED 100 each 0   metFORMIN (GLUCOPHAGE) 500 MG tablet TAKE 2 TABLETS (1,000 MG TOTAL) BY MOUTH 2 (TWO) TIMES DAILY WITH A MEAL. 120 tablet 0   metoprolol succinate (TOPROL-XL) 25 MG 24 hr tablet TAKE 1 TABLET (25 MG TOTAL) BY MOUTH DAILY.  30 tablet 0   Potassium Bicarb-Citric Acid (EFFER-K) 10 MEQ TBEF Take 10 mEq by mouth daily.     No facility-administered medications prior to visit.    Allergies  Allergen Reactions   Atorvastatin Other (See Comments)    Made body feel flushed and hot all over   Carafate [Sucralfate] Swelling and Other (See Comments)    Patient stated it made her face swell (angioedema)- no breathing issues, however   Lisinopril Swelling and Other (See Comments)    Patient stated it made her face swell (angioedema)- no breathing issues, however       Objective:    BP (!) 161/90   Pulse 62   Resp 16   Wt 209 lb (94.8 kg)   SpO2 98%   BMI 34.78 kg/m  Wt Readings from Last  3 Encounters:  05/29/21 209 lb (94.8 kg)  02/27/21 217 lb (98.4 kg)  09/19/20 207 lb 12.8 oz (94.3 kg)    Physical Exam Exam conducted with a chaperone present.  Constitutional:      Appearance: She is well-developed.  HENT:     Head: Normocephalic.  Cardiovascular:     Rate and Rhythm: Normal rate and regular rhythm.     Heart sounds: Normal heart sounds.  Pulmonary:     Effort: Pulmonary effort is normal.     Breath sounds: Normal breath sounds.  Chest:     Chest wall: No mass.  Breasts:    Right: Normal. No axillary adenopathy or supraclavicular adenopathy.     Left: Normal. No axillary adenopathy or supraclavicular adenopathy.  Abdominal:     General: Bowel sounds are normal.     Palpations: Abdomen is soft.     Hernia: There is no hernia in the left inguinal area.  Genitourinary:    Exam position: Lithotomy position.     Labia:        Right: No rash, tenderness, lesion or injury.        Left: No rash, tenderness, lesion or injury.      Vagina: Normal. No signs of injury and foreign body. No vaginal discharge, erythema, tenderness or bleeding.     Cervix: No cervical motion tenderness or friability.     Uterus: Not deviated and not enlarged.      Adnexa:        Right: No mass, tenderness or fullness.          Left: No mass, tenderness or fullness.       Rectum: Normal. No external hemorrhoid.  Lymphadenopathy:     Upper Body:     Right upper body: No supraclavicular, axillary or pectoral adenopathy.     Left upper body: No supraclavicular, axillary or pectoral adenopathy.     Lower Body: No right inguinal adenopathy. No left inguinal adenopathy.  Skin:    General: Skin is warm and dry.  Neurological:     Mental Status: She is alert and oriented to person, place, and time.  Psychiatric:        Behavior: Behavior normal.        Thought Content: Thought content normal.        Judgment: Judgment normal.         Patient has been counseled extensively about nutrition and exercise as well as the importance of adherence with medications and regular follow-up. The patient was given clear instructions to go to ER or return to medical center if symptoms don't improve, worsen or new problems develop. The patient verbalized understanding.   Follow-up: Return in about 3 weeks (around 06/19/2021) for BP CHECK WITH LUKE 3 weeks. see me in 3 months.   Gildardo Pounds, FNP-BC [facility] [place], [place]   05/29/2021, 12:36 PM

## 2021-05-30 ENCOUNTER — Other Ambulatory Visit: Payer: Self-pay

## 2021-05-30 LAB — BASIC METABOLIC PANEL
BUN/Creatinine Ratio: 12 (ref 12–28)
BUN: 13 mg/dL (ref 8–27)
CO2: 25 mmol/L (ref 20–29)
Calcium: 8.8 mg/dL (ref 8.7–10.3)
Chloride: 106 mmol/L (ref 96–106)
Creatinine, Ser: 1.07 mg/dL — ABNORMAL HIGH (ref 0.57–1.00)
Glucose: 96 mg/dL (ref 65–99)
Potassium: 4.5 mmol/L (ref 3.5–5.2)
Sodium: 145 mmol/L — ABNORMAL HIGH (ref 134–144)
eGFR: 58 mL/min/{1.73_m2} — ABNORMAL LOW (ref >59)

## 2021-05-30 LAB — CERVICOVAGINAL ANCILLARY ONLY
Bacterial Vaginitis (gardnerella): NEGATIVE
Candida Glabrata: NEGATIVE
Candida Vaginitis: NEGATIVE
Chlamydia: NEGATIVE
Comment: NEGATIVE
Comment: NEGATIVE
Comment: NEGATIVE
Comment: NEGATIVE
Comment: NEGATIVE
Comment: NORMAL
Neisseria Gonorrhea: NEGATIVE
Trichomonas: NEGATIVE

## 2021-05-30 LAB — HEMOGLOBIN A1C
Est. average glucose Bld gHb Est-mCnc: 154 mg/dL
Hgb A1c MFr Bld: 7 % — ABNORMAL HIGH (ref 4.8–5.6)

## 2021-05-30 LAB — HIV ANTIBODY (ROUTINE TESTING W REFLEX): HIV Screen 4th Generation wRfx: NONREACTIVE

## 2021-05-30 MED ORDER — TRUE METRIX BLOOD GLUCOSE TEST VI STRP
ORAL_STRIP | 2 refills | Status: DC
Start: 2021-05-30 — End: 2021-08-29
  Filled 2021-05-30: qty 100, fill #0

## 2021-06-02 LAB — CYTOLOGY - PAP
Comment: NEGATIVE
Diagnosis: NEGATIVE
High risk HPV: NEGATIVE

## 2021-06-19 ENCOUNTER — Telehealth: Payer: Self-pay

## 2021-06-19 NOTE — Telephone Encounter (Signed)
Patient was called and a voicemail was left informing patient to return phone call for lab results.   CRM CREATED 

## 2021-06-19 NOTE — Telephone Encounter (Signed)
-----   Message from Claiborne Rigg, NP sent at 06/05/2021  6:30 PM EDT ----- Normal PAP. Normal vaginal swab. HIV negative. Kidney function stable. Liver function and electrolytes are essentially normal.  A1c 7.0. Continue all medications as prescribed.

## 2021-06-21 ENCOUNTER — Telehealth: Payer: Self-pay | Admitting: Nurse Practitioner

## 2021-06-21 NOTE — Telephone Encounter (Signed)
Called pt Per Provider: to schedule an appt for BLOOD PLESSURE CHECK W/ LUKE VAN AUSDALL at [facility]. LVM informing this

## 2021-06-26 ENCOUNTER — Telehealth: Payer: Self-pay | Admitting: *Deleted

## 2021-06-26 NOTE — Telephone Encounter (Signed)
-----   Message from Claiborne Rigg, NP sent at 05/29/2021 12:37 PM EDT ----- Regarding: APPT WITH LUKE Pls schedule her with luke for BP check in 3 weeks. Thank you

## 2021-07-24 ENCOUNTER — Other Ambulatory Visit: Payer: Self-pay

## 2021-07-31 ENCOUNTER — Other Ambulatory Visit: Payer: Self-pay

## 2021-08-01 ENCOUNTER — Other Ambulatory Visit: Payer: Self-pay

## 2021-08-08 ENCOUNTER — Ambulatory Visit: Payer: Self-pay

## 2021-08-08 ENCOUNTER — Other Ambulatory Visit: Payer: Self-pay

## 2021-08-20 DIAGNOSIS — Z8639 Personal history of other endocrine, nutritional and metabolic disease: Secondary | ICD-10-CM | POA: Insufficient documentation

## 2021-08-29 ENCOUNTER — Other Ambulatory Visit: Payer: Self-pay | Admitting: Nurse Practitioner

## 2021-08-29 ENCOUNTER — Ambulatory Visit: Payer: Self-pay | Attending: Nurse Practitioner | Admitting: Nurse Practitioner

## 2021-08-29 ENCOUNTER — Other Ambulatory Visit: Payer: Self-pay

## 2021-08-29 ENCOUNTER — Encounter: Payer: Self-pay | Admitting: Nurse Practitioner

## 2021-08-29 DIAGNOSIS — Z794 Long term (current) use of insulin: Secondary | ICD-10-CM

## 2021-08-29 DIAGNOSIS — E785 Hyperlipidemia, unspecified: Secondary | ICD-10-CM

## 2021-08-29 DIAGNOSIS — E1165 Type 2 diabetes mellitus with hyperglycemia: Secondary | ICD-10-CM

## 2021-08-29 DIAGNOSIS — Z1211 Encounter for screening for malignant neoplasm of colon: Secondary | ICD-10-CM

## 2021-08-29 DIAGNOSIS — I10 Essential (primary) hypertension: Secondary | ICD-10-CM

## 2021-08-29 MED ORDER — METOPROLOL SUCCINATE ER 25 MG PO TB24
25.0000 mg | ORAL_TABLET | Freq: Every day | ORAL | 1 refills | Status: DC
  Filled 2021-08-29: qty 90, 90d supply, fill #0

## 2021-08-29 MED ORDER — TRUEPLUS LANCETS 28G MISC
Freq: Every day | 2 refills | Status: DC
  Filled 2021-08-29: qty 100, 50d supply, fill #0

## 2021-08-29 MED ORDER — ROSUVASTATIN CALCIUM 40 MG PO TABS
40.0000 mg | ORAL_TABLET | Freq: Every day | ORAL | 1 refills | Status: DC
  Filled 2021-08-29: qty 90, 90d supply, fill #0

## 2021-08-29 MED ORDER — METFORMIN HCL 500 MG PO TABS
ORAL_TABLET | Freq: Two times a day (BID) | ORAL | 1 refills | Status: DC
  Filled 2021-08-29: qty 360, fill #0

## 2021-08-29 MED ORDER — BASAGLAR KWIKPEN 100 UNIT/ML ~~LOC~~ SOPN
15.0000 [IU] | PEN_INJECTOR | Freq: Every day | SUBCUTANEOUS | 6 refills | Status: DC
  Filled 2021-08-29: qty 6, 40d supply, fill #0

## 2021-08-29 MED ORDER — TRUE METRIX BLOOD GLUCOSE TEST VI STRP
ORAL_STRIP | 2 refills | Status: AC
  Filled 2021-08-29: qty 100, 50d supply, fill #0

## 2021-08-29 MED ORDER — INSULIN PEN NEEDLE 32G X 4 MM MISC
11 refills | Status: DC
  Filled 2021-08-29: qty 100, 90d supply, fill #0

## 2021-08-29 MED ORDER — FUROSEMIDE 20 MG PO TABS
20.0000 mg | ORAL_TABLET | Freq: Every day | ORAL | 1 refills | Status: DC
  Filled 2021-08-29: qty 90, 90d supply, fill #0

## 2021-08-29 MED ORDER — AMLODIPINE BESYLATE 10 MG PO TABS
10.0000 mg | ORAL_TABLET | Freq: Every day | ORAL | 1 refills | Status: DC
  Filled 2021-08-29: qty 90, 90d supply, fill #0

## 2021-08-29 NOTE — Progress Notes (Signed)
Virtual Visit via Telephone Note Due to national recommendations of social distancing due to COVID 19, telehealth visit is felt to be most appropriate for this patient at this time.  I discussed the limitations, risks, security and privacy concerns of performing an evaluation and management service by telephone and the availability of in person appointments. I also discussed with the patient that there may be a patient responsible charge related to this service. The patient expressed understanding and agreed to proceed.    I connected with Alberteen Sam on 08/29/21  at   8:30 AM EDT  EDT by telephone and verified that I am speaking with the correct person using two identifiers.  Location of Patient: Private Residence   Location of Provider: [facility]    Persons participating in Telemedicine visit: Bertram Denver FNP-BC Sui Kasparek Interpreter ID [id number]   History of Present Illness: Telemedicine visit for: HTN, DM She has a past medical history of CAD (coronary artery disease), Hyperlipidemia, Hypertension, and Pleural effusion (06/24/2019).    DM 2 She is not monitoring her blood glucose levels daily at home. Last blood glucose reading was 101. She mostly checks her blood glucose fasting with averages in the 100s. She is not using basaglar. Only taking metformin 500 mg BID as prescribed. LDL at goal.  Lab Results  Component Value Date   HGBA1C 7.0 (H) 05/29/2021   Lab Results  Component Value Date   LDLCALC 66 02/27/2021      HTN Blood pressure is not well controlled. She is not taking her medications as prescribed: amlodipine 10 mg daily, meotprolol 25 mg daily or furosemide 20 mg daily. She does not monitor her blood pressure at home.  BP Readings from Last 3 Encounters:  05/29/21 (!) 161/90  02/27/21 (!) 162/85  09/19/20 138/84    BP Readings from Last 3 Encounters:  05/29/21 (!) 161/90  02/27/21 (!) 162/85  09/19/20 138/84       Past Medical History:  Diagnosis Date   CAD (coronary artery disease)    status post acute NSTEMI 05/2019 followed by CABG x4 with a LIMA to the LAD, SVG to OM, SVG to diagonal, SVG to distal LAD.    Hyperlipidemia    Hypertension    Pleural effusion 06/24/2019   LEFT    Past Surgical History:  Procedure Laterality Date   APPLICATION OF WOUND VAC N/A 06/23/2019   Procedure: APPLICATION OF WOUND VAC;  Surgeon: Linden Dolin, MD;  Location: [facility] OR;  Service: Thoracic;  Laterality: N/A;   CORONARY ARTERY BYPASS GRAFT N/A 06/10/2019   Procedure: CORONARY ARTERY BYPASS GRAFTING (CABG) x Four, using left internal mammary artery and right leg greater saphenous vein harvested endoscopically;  Surgeon: Linden Dolin, MD;  Location: [facility] OR;  Service: Open Heart Surgery;  Laterality: N/A;   LEFT HEART CATH AND CORONARY ANGIOGRAPHY N/A 06/09/2019   Procedure: LEFT HEART CATH AND CORONARY ANGIOGRAPHY;  Surgeon: Lennette Bihari, MD;  Location: [facility];  Service: Cardiovascular;  Laterality: N/A;   STERNAL WOUND DEBRIDEMENT N/A 06/23/2019   Procedure: STERNAL WOUND DEBRIDEMENT;  Surgeon: Linden Dolin, MD;  Location: [facility] OR;  Service: Thoracic;  Laterality: N/A;   TEE WITHOUT CARDIOVERSION N/A 06/10/2019   Procedure: TRANSESOPHAGEAL ECHOCARDIOGRAM (TEE);  Surgeon: Linden Dolin, MD;  Location: [facility] OR;  Service: Open Heart Surgery;  Laterality: N/A;    Family History  Problem Relation Age of Onset   Hypertension  Mother     Social History   Socioeconomic History   Marital status: Married    Spouse name: Not on file   Number of children: Not on file   Years of education: Not on file   Highest education level: Not on file  Occupational History   Not on file  Tobacco Use   Smoking status: Never   Smokeless tobacco: Never  Vaping Use   Vaping Use: Never used  Substance and Sexual Activity   Alcohol use: Never   Drug use: Never   Sexual activity: Not Currently  Other  Topics Concern   Not on file  Social History Narrative   Not on file   Social Determinants of Health   Financial Resource Strain: Not on file  Food Insecurity: Not on file  Transportation Needs: Not on file  Physical Activity: Not on file  Stress: Not on file  Social Connections: Not on file     Observations/Objective: Awake, alert and oriented x 3   Review of Systems  Constitutional:  Negative for fever, malaise/fatigue and weight loss.  HENT: Negative.  Negative for nosebleeds.   Eyes: Negative.  Negative for blurred vision, double vision and photophobia.  Respiratory: Negative.  Negative for cough and shortness of breath.   Cardiovascular: Negative.  Negative for chest pain, palpitations and leg swelling.  Gastrointestinal: Negative.  Negative for heartburn, nausea and vomiting.  Musculoskeletal: Negative.  Negative for myalgias.  Neurological: Negative.  Negative for dizziness, focal weakness, seizures and headaches.  Psychiatric/Behavioral: Negative.  Negative for suicidal ideas.    Assessment and Plan: Diagnoses and all orders for this visit:  Primary hypertension -     metoprolol succinate (TOPROL-XL) 25 MG 24 hr tablet; Take 1 tablet (25 mg total) by mouth daily. -     furosemide (LASIX) 20 MG tablet; Take 1 tablet (20 mg total) by mouth daily. -     amLODipine (NORVASC) 10 MG tablet; Take 1 tablet (10 mg total) by mouth daily. .    Type 2 diabetes mellitus with hyperglycemia, with long-term current use of insulin (HCC) -     metFORMIN (GLUCOPHAGE) 500 MG tablet; TAKE 2 TABLETS (1,000 MG TOTAL) BY MOUTH 2 (TWO) TIMES DAILY WITH A MEAL. -     Insulin Pen Needle (PEN NEEDLES) 32G X 4 MM MISC; Use once daily with Basaglar -     Insulin Glargine (BASAGLAR KWIKPEN) 100 UNIT/ML; Inject 15 Units into the skin daily. -     glucose blood (TRUE METRIX BLOOD GLUCOSE TEST) test strip; Use to check blood sugar twice daily. -     TRUEplus Lancets 28G MISC; USE TO CHECK BLOOD  SUGAR ONCE DAILY.  Dyslipidemia, goal LDL below 70 -     rosuvastatin (CRESTOR) 40 MG tablet; Take 1 tablet (40 mg total) by mouth daily.    Follow Up Instructions Return in about 3 months (around 11/29/2021) for HTN/HPL/DM.     I discussed the assessment and treatment plan with the patient. The patient was provided an opportunity to ask questions and all were answered. The patient agreed with the plan and demonstrated an understanding of the instructions.   The patient was advised to call back or seek an in-person evaluation if the symptoms worsen or if the condition fails to improve as anticipated.  I provided 10 minutes of non-face-to-face time during this encounter including median intraservice time, reviewing previous notes, labs, imaging, medications and explaining diagnosis and management.  Claiborne Rigg, FNP-BC

## 2021-08-30 LAB — CMP14+EGFR
ALT: 19 IU/L (ref 0–32)
AST: 16 IU/L (ref 0–40)
Albumin/Globulin Ratio: 1.6 (ref 1.2–2.2)
Albumin: 4.4 g/dL (ref 3.8–4.8)
Alkaline Phosphatase: 71 IU/L (ref 44–121)
BUN/Creatinine Ratio: 19 (ref 12–28)
BUN: 21 mg/dL (ref 8–27)
Bilirubin Total: <0.2 mg/dL (ref 0.0–1.2)
CO2: 23 mmol/L (ref 20–29)
Calcium: 9.4 mg/dL (ref 8.7–10.3)
Chloride: 105 mmol/L (ref 96–106)
Creatinine, Ser: 1.1 mg/dL — ABNORMAL HIGH (ref 0.57–1.00)
Globulin, Total: 2.8 g/dL (ref 1.5–4.5)
Glucose: 101 mg/dL — ABNORMAL HIGH (ref 70–99)
Potassium: 4.6 mmol/L (ref 3.5–5.2)
Sodium: 143 mmol/L (ref 134–144)
Total Protein: 7.2 g/dL (ref 6.0–8.5)
eGFR: 56 mL/min/{1.73_m2} — ABNORMAL LOW (ref >59)

## 2021-08-30 LAB — HEMOGLOBIN A1C
Est. average glucose Bld gHb Est-mCnc: 140 mg/dL
Hgb A1c MFr Bld: 6.5 % — ABNORMAL HIGH (ref 4.8–5.6)

## 2021-09-01 ENCOUNTER — Telehealth: Payer: Self-pay

## 2021-09-01 NOTE — Telephone Encounter (Signed)
-----   Message from Claiborne Rigg, NP sent at 08/30/2021 10:57 PM EDT ----- Kidney function stable. Liver function and electrolytes are normal.

## 2021-09-01 NOTE — Telephone Encounter (Signed)
-----   Message from Claiborne Rigg, NP sent at 08/30/2021 10:53 PM EDT ----- Diabetes improved. Decreased from 7 to 6.5. Decrease basaglar to 10 units instead of 15 units.

## 2021-09-05 ENCOUNTER — Other Ambulatory Visit: Payer: Self-pay

## 2021-09-18 ENCOUNTER — Ambulatory Visit: Payer: Self-pay

## 2021-10-16 ENCOUNTER — Other Ambulatory Visit: Payer: Self-pay

## 2021-11-10 ENCOUNTER — Other Ambulatory Visit: Payer: Self-pay | Admitting: Pharmacist

## 2021-11-10 ENCOUNTER — Other Ambulatory Visit (HOSPITAL_COMMUNITY): Payer: Self-pay

## 2021-11-10 ENCOUNTER — Encounter: Payer: Self-pay | Admitting: Nurse Practitioner

## 2021-11-10 ENCOUNTER — Ambulatory Visit: Payer: MEDICAID | Attending: Nurse Practitioner | Admitting: Nurse Practitioner

## 2021-11-10 ENCOUNTER — Other Ambulatory Visit: Payer: Self-pay

## 2021-11-10 VITALS — BP 148/94 | HR 74 | Ht 65.0 in | Wt 208.4 lb

## 2021-11-10 DIAGNOSIS — R7989 Other specified abnormal findings of blood chemistry: Secondary | ICD-10-CM

## 2021-11-10 DIAGNOSIS — I10 Essential (primary) hypertension: Secondary | ICD-10-CM

## 2021-11-10 DIAGNOSIS — Z23 Encounter for immunization: Secondary | ICD-10-CM

## 2021-11-10 DIAGNOSIS — Z1211 Encounter for screening for malignant neoplasm of colon: Secondary | ICD-10-CM

## 2021-11-10 MED ORDER — AMLODIPINE BESYLATE 10 MG PO TABS
10.0000 mg | ORAL_TABLET | Freq: Every day | ORAL | 0 refills | Status: DC
  Filled 2021-11-10 (×2): qty 30, 30d supply, fill #0

## 2021-11-10 NOTE — Progress Notes (Addendum)
Assessment & Plan:  Kimberly Bryant was seen today for hypertension.  Diagnoses and all orders for this visit:  Primary hypertension -     losartan (COZAAR) 25 MG tablet; Take 0.5 tablets (12.5 mg total) by mouth daily.  Need for influenza vaccination -     Cancel: Flu Vaccine QUAD 49moIM (Fluarix, Fluzone & Alfiuria Quad PF)  Elevated serum creatinine -     CMP14+EGFR  Colon cancer screening -     Fecal occult blood, imunochemical([facility])    Patient has been counseled on age-appropriate routine health concerns for screening and prevention. These are reviewed and up-to-date. Referrals have been placed accordingly. Immunizations are up-to-date or declined.    Subjective:   Chief Complaint  Patient presents with   Hypertension   HPI Kimberly ZOLLINGER89y.o. female presents to office today for follow up to HTN VRI was used to communicate directly with patient for the entire encounter including providing detailed patient instructions.   She has a hx of hypertension, HLD, angioedema on ACE inhibitor, GERD, CAD status post acute NSTEMI 05/2019 followed by CABG x4 with a LIMA to the LAD, SVG to OM, SVG to diagonal, SVG to distal LAD.  EF normal.    HTN Not well controlled. She is currently taking amlodipine 10 mg daily, furosemide 20 mg daily and toprol XL 25 mg daily. Will add low dose ARB today.  BP Readings from Last 3 Encounters:  11/10/21 (!) 148/94  05/29/21 (!) 161/90  02/27/21 (!) 162/85    DM 2 Last reading she can recall was 110. She has a family member checking her blood glucose readings weekly.  She is not taking insulin as she states she feels lshe is allergic to it. "I dont feel well when I take it." Lab Results  Component Value Date   HGBA1C 6.5 (H) 08/29/2021     Sometimes her incision hurts and its very sensitive. She  Review of Systems  Constitutional:  Negative for fever, malaise/fatigue and weight loss.  HENT: Negative.  Negative for nosebleeds.   Eyes:  Negative.  Negative for blurred vision, double vision and photophobia.  Respiratory: Negative.  Negative for cough and shortness of breath.   Cardiovascular: Negative.  Negative for chest pain, palpitations and leg swelling.  Gastrointestinal: Negative.  Negative for heartburn, nausea and vomiting.  Musculoskeletal: Negative.  Negative for myalgias.  Neurological: Negative.  Negative for dizziness, focal weakness, seizures and headaches.  Psychiatric/Behavioral: Negative.  Negative for suicidal ideas.    Past Medical History:  Diagnosis Date   CAD (coronary artery disease)    status post acute NSTEMI 05/2019 followed by CABG x4 with a LIMA to the LAD, SVG to OM, SVG to diagonal, SVG to distal LAD.    Hyperlipidemia    Hypertension    Pleural effusion 06/24/2019   LEFT    Past Surgical History:  Procedure Laterality Date   APPLICATION OF WOUND VAC N/A 06/23/2019   Procedure: APPLICATION OF WOUND VAC;  Surgeon: AWonda Olds MD;  Location: [facility];  Service: Thoracic;  Laterality: N/A;   CORONARY ARTERY BYPASS GRAFT N/A 06/10/2019   Procedure: CORONARY ARTERY BYPASS GRAFTING (CABG) x Four, using left internal mammary artery and right leg greater saphenous vein harvested endoscopically;  Surgeon: AWonda Olds MD;  [facility]  Service: Open Heart Surgery;  Laterality: N/A;   LEFT HEART CATH AND CORONARY ANGIOGRAPHY N/A 06/09/2019   Procedure: LEFT HEART CATH AND CORONARY ANGIOGRAPHY;  Surgeon: KShelva Majestic  A, MD;  Location: [facility];  Service: Cardiovascular;  Laterality: N/A;   STERNAL WOUND DEBRIDEMENT N/A 06/23/2019   Procedure: STERNAL WOUND DEBRIDEMENT;  Surgeon: Wonda Olds, MD;  Location: [facility];  Service: Thoracic;  Laterality: N/A;   TEE WITHOUT CARDIOVERSION N/A 06/10/2019   Procedure: TRANSESOPHAGEAL ECHOCARDIOGRAM (TEE);  Surgeon: Wonda Olds, MD;  Location: [place];  Service: Open Heart Surgery;  Laterality: N/A;    Family History  Problem Relation  Age of Onset   Hypertension Mother     Social History Reviewed with no changes to be made today.   Outpatient Medications Prior to Visit  Medication Sig Dispense Refill   acetaminophen (TYLENOL) 325 MG tablet Take 650 mg by mouth every 6 (six) hours as needed for mild pain or moderate pain.      Blood Glucose Monitoring Suppl (TRUE METRIX METER) w/Device KIT Use to check blood sugar once daily. 1 kit 0   furosemide (LASIX) 20 MG tablet Take 1 tablet (20 mg total) by mouth daily. 90 tablet 1   glucose blood (TRUE METRIX BLOOD GLUCOSE TEST) test strip Use to check blood sugar twice daily. 100 strip 2   Insulin Glargine (BASAGLAR KWIKPEN) 100 UNIT/ML Inject 15 Units into the skin daily. 15 mL 6   Insulin Pen Needle 32G X 4 MM MISC Use once daily with Basaglar 100 each 11   metFORMIN (GLUCOPHAGE) 500 MG tablet TAKE 2 TABLETS (1,000 MG TOTAL) BY MOUTH 2 (TWO) TIMES DAILY WITH A MEAL. 360 tablet 1   metoprolol succinate (TOPROL-XL) 25 MG 24 hr tablet Take 1 tablet (25 mg total) by mouth daily. 90 tablet 1   rosuvastatin (CRESTOR) 40 MG tablet Take 1 tablet (40 mg total) by mouth daily. 90 tablet 1   TRUEplus Lancets 28G MISC USE TO CHECK BLOOD SUGAR ONCE DAILY. 100 each 2   amLODipine (NORVASC) 10 MG tablet Take 1 tablet (10 mg total) by mouth daily. 90 tablet 1   No facility-administered medications prior to visit.    Allergies  Allergen Reactions   Atorvastatin Other (See Comments)    Made body feel flushed and hot all over   Carafate [Sucralfate] Swelling and Other (See Comments)    Patient stated it made her face swell (angioedema)- no breathing issues, however   Lisinopril Swelling and Other (See Comments)    Patient stated it made her face swell (angioedema)- no breathing issues, however       Objective:    BP (!) 148/94    Pulse 74    Ht '5\' 5"'  (1.651 m)    Wt 208 lb 6 oz (94.5 kg)    SpO2 95%    BMI 34.68 kg/m  Wt Readings from Last 3 Encounters:  11/10/21 208 lb 6 oz (94.5  kg)  05/29/21 209 lb (94.8 kg)  02/27/21 217 lb (98.4 kg)    Physical Exam Vitals and nursing note reviewed.  Constitutional:      Appearance: She is well-developed.  HENT:     Head: Normocephalic and atraumatic.  Cardiovascular:     Rate and Rhythm: Normal rate and regular rhythm.     Heart sounds: Normal heart sounds. No murmur heard.   No friction rub. No gallop.  Pulmonary:     Effort: Pulmonary effort is normal. No tachypnea or respiratory distress.     Breath sounds: Normal breath sounds. No decreased breath sounds, wheezing, rhonchi or rales.  Chest:     Chest wall:  No tenderness.  Abdominal:     General: Bowel sounds are normal.     Palpations: Abdomen is soft.  Musculoskeletal:        General: Normal range of motion.     Cervical back: Normal range of motion.  Skin:    General: Skin is warm and dry.  Neurological:     Mental Status: She is alert and oriented to person, place, and time.     Coordination: Coordination normal.  Psychiatric:        Behavior: Behavior normal. Behavior is cooperative.        Thought Content: Thought content normal.        Judgment: Judgment normal.         Patient has been counseled extensively about nutrition and exercise as well as the importance of adherence with medications and regular follow-up. The patient was given clear instructions to go to ER or return to medical center if symptoms don't improve, worsen or new problems develop. The patient verbalized understanding.   Follow-up: Return in about 3 months (around 02/08/2022).   Gildardo Pounds, FNP-BC [facility] [place], [place]   11/19/2021, 11:35 PM

## 2021-11-11 LAB — CMP14+EGFR
ALT: 34 IU/L — ABNORMAL HIGH (ref 0–32)
AST: 20 IU/L (ref 0–40)
Albumin/Globulin Ratio: 1.6 (ref 1.2–2.2)
Albumin: 4.3 g/dL (ref 3.8–4.8)
Alkaline Phosphatase: 80 IU/L (ref 44–121)
BUN/Creatinine Ratio: 16 (ref 12–28)
BUN: 16 mg/dL (ref 8–27)
Bilirubin Total: <0.2 mg/dL (ref 0.0–1.2)
CO2: 25 mmol/L (ref 20–29)
Calcium: 9.7 mg/dL (ref 8.7–10.3)
Chloride: 107 mmol/L — ABNORMAL HIGH (ref 96–106)
Creatinine, Ser: 1.02 mg/dL — ABNORMAL HIGH (ref 0.57–1.00)
Globulin, Total: 2.7 g/dL (ref 1.5–4.5)
Glucose: 79 mg/dL (ref 70–99)
Potassium: 4.4 mmol/L (ref 3.5–5.2)
Sodium: 143 mmol/L (ref 134–144)
Total Protein: 7 g/dL (ref 6.0–8.5)
eGFR: 61 mL/min/{1.73_m2} (ref >59)

## 2021-11-13 ENCOUNTER — Telehealth: Payer: Self-pay

## 2021-11-13 NOTE — Telephone Encounter (Signed)
Called pt and Vm is not set up to leave a vm.

## 2021-11-13 NOTE — Telephone Encounter (Signed)
-----   Message from Claiborne Rigg, NP sent at 11/12/2021  8:50 AM EST ----- Creatinine for kidney function has improved. Liver function and electrolytes normal

## 2021-11-19 ENCOUNTER — Encounter: Payer: Self-pay | Admitting: Nurse Practitioner

## 2021-11-19 MED ORDER — LOSARTAN POTASSIUM 25 MG PO TABS
12.5000 mg | ORAL_TABLET | Freq: Every day | ORAL | 0 refills | Status: DC
Start: 2021-11-19 — End: 2022-05-16
  Filled 2021-11-19: qty 45, 90d supply, fill #0

## 2021-11-20 ENCOUNTER — Other Ambulatory Visit: Payer: Self-pay

## 2021-11-26 LAB — FECAL OCCULT BLOOD, IMMUNOCHEMICAL: Fecal Occult Bld: POSITIVE — AB

## 2021-11-27 ENCOUNTER — Other Ambulatory Visit: Payer: Self-pay

## 2021-11-28 ENCOUNTER — Telehealth: Payer: Self-pay

## 2021-11-28 ENCOUNTER — Other Ambulatory Visit: Payer: Self-pay | Admitting: Nurse Practitioner

## 2021-11-28 DIAGNOSIS — R195 Other fecal abnormalities: Secondary | ICD-10-CM

## 2021-11-28 NOTE — Telephone Encounter (Signed)
-----   Message from Gildardo Pounds, NP sent at 11/28/2021 11:21 AM EST ----- Stool test positive for blood.  Referral has been made to gastroenterology at this time.  Kidney function is stable and liver function is normal Gastroenterology will reach out to her to schedule an appointment for possible colonoscopy due to stool test results

## 2021-11-28 NOTE — Telephone Encounter (Signed)
Called pt phone is not set up to receive vm. Used interpretor Artis Flock [phone]

## 2022-02-09 ENCOUNTER — Ambulatory Visit: Payer: Self-pay | Admitting: Nurse Practitioner

## 2022-03-06 ENCOUNTER — Encounter: Payer: Self-pay | Admitting: Nurse Practitioner

## 2022-03-26 DIAGNOSIS — R32 Unspecified urinary incontinence: Secondary | ICD-10-CM | POA: Insufficient documentation

## 2022-05-16 ENCOUNTER — Ambulatory Visit: Payer: MEDICAID | Attending: Nurse Practitioner | Admitting: Nurse Practitioner

## 2022-05-16 ENCOUNTER — Encounter: Payer: Self-pay | Admitting: Nurse Practitioner

## 2022-05-16 VITALS — BP 160/93 | HR 75 | Temp 97.8°F | Resp 16 | Wt 206.0 lb

## 2022-05-16 DIAGNOSIS — R7989 Other specified abnormal findings of blood chemistry: Secondary | ICD-10-CM

## 2022-05-16 DIAGNOSIS — Z794 Long term (current) use of insulin: Secondary | ICD-10-CM

## 2022-05-16 DIAGNOSIS — E785 Hyperlipidemia, unspecified: Secondary | ICD-10-CM

## 2022-05-16 DIAGNOSIS — R195 Other fecal abnormalities: Secondary | ICD-10-CM

## 2022-05-16 DIAGNOSIS — E1165 Type 2 diabetes mellitus with hyperglycemia: Secondary | ICD-10-CM

## 2022-05-16 DIAGNOSIS — I10 Essential (primary) hypertension: Secondary | ICD-10-CM

## 2022-05-16 DIAGNOSIS — Z1211 Encounter for screening for malignant neoplasm of colon: Secondary | ICD-10-CM

## 2022-05-16 DIAGNOSIS — L910 Hypertrophic scar: Secondary | ICD-10-CM

## 2022-05-16 LAB — POCT GLYCOSYLATED HEMOGLOBIN (HGB A1C): HbA1c, POC (prediabetic range): 6.2 % (ref 5.7–6.4)

## 2022-05-16 MED ORDER — METFORMIN HCL 500 MG PO TABS: ORAL_TABLET | Freq: Two times a day (BID) | ORAL | 1 refills | Status: DC

## 2022-05-16 MED ORDER — INSULIN PEN NEEDLE 32G X 4 MM MISC: 11 refills | Status: AC

## 2022-05-16 MED ORDER — LIDOCAINE 5 % EX CREA: TOPICAL_CREAM | CUTANEOUS | 1 refills | Status: DC

## 2022-05-16 MED ORDER — TRUEPLUS LANCETS 28G MISC: Freq: Every day | 2 refills | Status: AC

## 2022-05-16 MED ORDER — METOPROLOL SUCCINATE ER 25 MG PO TB24: 25.0000 mg | ORAL_TABLET | Freq: Every day | ORAL | 1 refills | Status: DC

## 2022-05-16 MED ORDER — ROSUVASTATIN CALCIUM 40 MG PO TABS: 40.0000 mg | ORAL_TABLET | Freq: Every day | ORAL | 1 refills | Status: DC

## 2022-05-16 MED ORDER — BASAGLAR KWIKPEN 100 UNIT/ML ~~LOC~~ SOPN
15.0000 [IU] | PEN_INJECTOR | Freq: Every day | SUBCUTANEOUS | 6 refills | Status: DC
Start: 2022-05-16 — End: 2022-08-27

## 2022-05-16 MED ORDER — LOSARTAN POTASSIUM 25 MG PO TABS: 25.0000 mg | ORAL_TABLET | Freq: Every day | ORAL | 1 refills | Status: DC

## 2022-05-16 MED ORDER — AMLODIPINE BESYLATE 10 MG PO TABS: 10.0000 mg | ORAL_TABLET | Freq: Every day | ORAL | 1 refills | Status: DC

## 2022-05-16 MED ORDER — FUROSEMIDE 20 MG PO TABS: 20.0000 mg | ORAL_TABLET | Freq: Every day | ORAL | 1 refills | Status: DC

## 2022-05-16 NOTE — Progress Notes (Signed)
Assessment & Plan:  [name] was seen today for diabetes.  Diagnoses and all orders for this visit:  Primary hypertension -     CMP14+EGFR Continue all antihypertensives as prescribed.  Reminded to bring in blood pressure log for follow  up appointment.  RECOMMENDATIONS: DASH/Mediterranean Diets are healthier choices for HTN.    Type 2 diabetes mellitus with hyperglycemia, with long-term current use of insulin (HCC) -     CMP14+EGFR  Colon cancer screening -     Ambulatory referral to Gastroenterology  Positive fecal occult blood test -     Ambulatory referral to Gastroenterology  Abnormal CBC -     CBC with Differential  Dyslipidemia, goal LDL below 70 -     Lipid panel    Patient has been counseled on age-appropriate routine health concerns for screening and prevention. These are reviewed and up-to-date. Referrals have been placed accordingly. Immunizations are up-to-date or declined.    Subjective:   Chief Complaint  Patient presents with   Diabetes   HPI Kimberly Bryant 66 y.o. female presents to office today for HTN and DM.  VRI was used to communicate directly with patient for the entire encounter including providing detailed patient instructions.    She has a positive FIT test in January. Was referred to GI however they were unable to contact her for scheduling. I have placed another referral and given her the number to schedule  She has a hx of hypertension, HLD, angioedema on ACE inhibitor, GERD, CAD status post acute NSTEMI 05/2019 followed by CABG x4 with a LIMA to the LAD, SVG to OM, SVG to diagonal, SVG to distal LAD.  EF normal.    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 She would like a referral to plastic surgery to see if the scar can be improved.   Hypertension Not well controlled. Most of her blood pressure medications have expired. She missed her 3 month follow up several  months ago. She is currently prescribed amlodipine 10 mg daily, furosemide 20 mg daily and toprol XL 25 mg daily, losartan 25 mg daily. She is not exercising and is not consistently adherent to low salt diet.  She does not have a blood pressure log  today.   Cardiac symptoms none.  Cardiovascular risk factors: advanced age (older than 86 for men, 35 for women), diabetes mellitus, dyslipidemia, hypertension, obesity (BMI >= 30 kg/m2), and sedentary lifestyle. Use of agents associated with hypertension: none.  History of target organ damage: angina/ prior myocardial infarction. BP Readings from Last 3 Encounters:  05/16/22 (!) 160/93  11/10/21 (!) 148/94  05/29/21 (!) 161/90    Hyperlipidemia Patient presents for follow up to hyperlipidemia.  She is medication compliant. She is not consistently diet compliant and denies  statin intolerance including myalgias.  Lab Results  Component Value Date   CHOL 120 02/27/2021   Lab Results  Component Value Date   HDL 42 02/27/2021   Lab Results  Component Value Date   LDLCALC 66 02/27/2021   Lab Results  Component Value Date   TRIG 55 02/27/2021   Lab Results  Component Value Date   CHOLHDL 2.9 02/27/2021      Diabetes Mellitus Type II Well controlled Current symptoms/problems include none  Known diabetic complications: cardiovascular disease Current diabetic medications include: basaglar 15 units daily, metformin 500 mg BID.  Eye exam current (within one year):  no Weight trend: stable Prior visit with dietician: no Current monitoring regimen:  patient not consistent with office visits Home blood sugar records:  does not monitor blood glucose levels daily Any episodes of hypoglycemia? no Is She on ACE inhibitor or angiotensin II receptor blocker?  Yes  Lab Results  Component Value Date   HGBA1C 6.5 (H) 08/29/2021   HGBA1C 7.0 (H) 05/29/2021   HGBA1C 7.7 (A) 02/27/2021        Review of Systems  Constitutional:  Negative for  fever, malaise/fatigue and weight loss.  HENT: Negative.  Negative for nosebleeds.   Eyes: Negative.  Negative for blurred vision, double vision and photophobia.  Respiratory: Negative.  Negative for cough and shortness of breath.   Cardiovascular: Negative.  Negative for chest pain, palpitations and leg swelling.  Gastrointestinal: Negative.  Negative for heartburn, nausea and vomiting.  Musculoskeletal: Negative.  Negative for myalgias.  Neurological: Negative.  Negative for dizziness, focal weakness, seizures and headaches.  Psychiatric/Behavioral: Negative.  Negative for suicidal ideas.     Past Medical History:  Diagnosis Date   CAD (coronary artery disease)    status post acute NSTEMI 05/2019 followed by CABG x4 with a LIMA to the LAD, SVG to OM, SVG to diagonal, SVG to distal LAD.    Hyperlipidemia    Hypertension    Pleural effusion 06/24/2019   LEFT    Past Surgical History:  Procedure Laterality Date   APPLICATION OF WOUND VAC N/A 06/23/2019   Procedure: APPLICATION OF WOUND VAC;  Surgeon: Wonda Olds, MD;  [facility];  Service: Thoracic;  Laterality: N/A;   CORONARY ARTERY BYPASS GRAFT N/A 06/10/2019   Procedure: CORONARY ARTERY BYPASS GRAFTING (CABG) x Four, using left internal mammary artery and right leg greater saphenous vein harvested endoscopically;  Surgeon: Wonda Olds, MD;  Location: [place];  Service: Open Heart Surgery;  Laterality: N/A;   LEFT HEART CATH AND CORONARY ANGIOGRAPHY N/A 06/09/2019   Procedure: LEFT HEART CATH AND CORONARY ANGIOGRAPHY;  Surgeon: Troy Sine, MD;  Location: [place] [facility];  Service: Cardiovascular;  Laterality: N/A;   STERNAL WOUND DEBRIDEMENT N/A 06/23/2019   Procedure: STERNAL WOUND DEBRIDEMENT;  Surgeon: Wonda Olds, MD;  Location: [facility];  Service: Thoracic;  Laterality: N/A;   TEE WITHOUT CARDIOVERSION N/A 06/10/2019   Procedure: TRANSESOPHAGEAL ECHOCARDIOGRAM (TEE);  Surgeon: Wonda Olds, MD;  Location:  [place];  Service: Open Heart Surgery;  Laterality: N/A;    Family History  Problem Relation Age of Onset   Hypertension Mother     Social History Reviewed with no changes to be made today.   Outpatient Medications Prior to Visit  Medication Sig Dispense Refill   acetaminophen (TYLENOL) 325 MG tablet Take 650 mg by mouth every 6 (six) hours as needed for mild pain or moderate pain.      amLODipine (NORVASC) 10 MG tablet Take 1 tablet (10 mg total) by mouth daily. 30 tablet 0   Blood Glucose Monitoring Suppl (TRUE METRIX METER) w/Device KIT Use to check blood sugar once daily. 1 kit 0   glucose blood (TRUE METRIX BLOOD GLUCOSE TEST) test strip Use to check blood sugar twice daily. 100 strip 2   Insulin Glargine (BASAGLAR KWIKPEN) 100 UNIT/ML Inject 15 Units into the skin daily. 15 mL 6   Insulin Pen Needle 32G X 4 MM MISC Use once daily with Basaglar 100 each 11   TRUEplus Lancets 28G MISC USE TO CHECK BLOOD SUGAR  ONCE DAILY. 100 each 2   furosemide (LASIX) 20 MG tablet Take 1 tablet (20 mg total) by mouth daily. 90 tablet 1   losartan (COZAAR) 25 MG tablet Take 0.5 tablets (12.5 mg total) by mouth daily. 45 tablet 0   metFORMIN (GLUCOPHAGE) 500 MG tablet TAKE 2 TABLETS (1,000 MG TOTAL) BY MOUTH 2 (TWO) TIMES DAILY WITH A MEAL. 360 tablet 1   metoprolol succinate (TOPROL-XL) 25 MG 24 hr tablet Take 1 tablet (25 mg total) by mouth daily. 90 tablet 1   rosuvastatin (CRESTOR) 40 MG tablet Take 1 tablet (40 mg total) by mouth daily. 90 tablet 1   No facility-administered medications prior to visit.    Allergies  Allergen Reactions   Atorvastatin Other (See Comments)    Made body feel flushed and hot all over   Carafate [Sucralfate] Swelling and Other (See Comments)    Patient stated it made her face swell (angioedema)- no breathing issues, however   Lisinopril Swelling and Other (See Comments)    Patient stated it made her face swell (angioedema)- no breathing issues, however        Objective:    BP (!) 160/93 (BP Location: Left Arm, Patient Position: Sitting, Cuff Size: Large)   Pulse 75   Temp 97.8 F (36.6 C) (Oral)   Resp 16   Wt 206 lb (93.4 kg)   SpO2 100%   BMI 34.28 kg/m  Wt Readings from Last 3 Encounters:  05/16/22 206 lb (93.4 kg)  11/10/21 208 lb 6 oz (94.5 kg)  05/29/21 209 lb (94.8 kg)    Physical Exam Vitals and nursing note reviewed.  Constitutional:      Appearance: She is well-developed.  HENT:     Head: Normocephalic and atraumatic.  Cardiovascular:     Rate and Rhythm: Normal rate and regular rhythm.     Heart sounds: Normal heart sounds. No murmur heard.    No friction rub. No gallop.  Pulmonary:     Effort: Pulmonary effort is normal. No tachypnea or respiratory distress.     Breath sounds: Normal breath sounds. No decreased breath sounds, wheezing, rhonchi or rales.  Chest:     Chest wall: No tenderness.    Abdominal:     General: Bowel sounds are normal.     Palpations: Abdomen is soft.  Musculoskeletal:        General: Normal range of motion.     Cervical back: Normal range of motion.  Skin:    General: Skin is warm and dry.  Neurological:     Mental Status: She is alert and oriented to person, place, and time.     Coordination: Coordination normal.  Psychiatric:        Behavior: Behavior normal. Behavior is cooperative.        Thought Content: Thought content normal.        Judgment: Judgment normal.          Patient has been counseled extensively about nutrition and exercise as well as the importance of adherence with medications and regular follow-up. The patient was given clear instructions to go to ER or return to medical center if symptoms don't improve, worsen or new problems develop. The patient verbalized understanding.   Follow-up: No follow-ups on file.   Gildardo Pounds, FNP-BC [facility] and [facility] [place], [place]   05/16/2022, 10:53 AM

## 2022-05-16 NOTE — Patient Instructions (Addendum)
PLEASE CALL: [facility]  [address] [place], [place] [address] PH# [phone]

## 2022-05-17 LAB — CBC WITH DIFFERENTIAL/PLATELET
Basophils Absolute: 0 10*3/uL (ref 0.0–0.2)
Basos: 0 %
EOS (ABSOLUTE): 0.1 10*3/uL (ref 0.0–0.4)
Eos: 2 %
Hematocrit: 41.7 % (ref 34.0–46.6)
Hemoglobin: 13.3 g/dL (ref 11.1–15.9)
Immature Grans (Abs): 0 10*3/uL (ref 0.0–0.1)
Immature Granulocytes: 0 %
Lymphocytes Absolute: 4.1 10*3/uL — ABNORMAL HIGH (ref 0.7–3.1)
Lymphs: 55 %
MCH: 27 pg (ref 26.6–33.0)
MCHC: 31.9 g/dL (ref 31.5–35.7)
MCV: 85 fL (ref 79–97)
Monocytes Absolute: 0.5 10*3/uL (ref 0.1–0.9)
Monocytes: 6 %
Neutrophils Absolute: 2.7 10*3/uL (ref 1.4–7.0)
Neutrophils: 37 %
Platelets: 301 10*3/uL (ref 150–450)
RBC: 4.93 x10E6/uL (ref 3.77–5.28)
RDW: 12.7 % (ref 11.7–15.4)
WBC: 7.5 10*3/uL (ref 3.4–10.8)

## 2022-05-17 LAB — CMP14+EGFR
ALT: 13 IU/L (ref 0–32)
AST: 12 IU/L (ref 0–40)
Albumin/Globulin Ratio: 1.6 (ref 1.2–2.2)
Albumin: 4.4 g/dL (ref 3.9–4.9)
Alkaline Phosphatase: 72 IU/L (ref 44–121)
BUN/Creatinine Ratio: 14 (ref 12–28)
BUN: 15 mg/dL (ref 8–27)
Bilirubin Total: 0.2 mg/dL (ref 0.0–1.2)
CO2: 24 mmol/L (ref 20–29)
Calcium: 8.9 mg/dL (ref 8.7–10.3)
Chloride: 104 mmol/L (ref 96–106)
Creatinine, Ser: 1.04 mg/dL — ABNORMAL HIGH (ref 0.57–1.00)
Globulin, Total: 2.8 g/dL (ref 1.5–4.5)
Glucose: 85 mg/dL (ref 70–99)
Potassium: 4.3 mmol/L (ref 3.5–5.2)
Sodium: 143 mmol/L (ref 134–144)
Total Protein: 7.2 g/dL (ref 6.0–8.5)
eGFR: 60 mL/min/{1.73_m2} (ref >59)

## 2022-05-17 LAB — LIPID PANEL
Chol/HDL Ratio: 3.4 ratio (ref 0.0–4.4)
Cholesterol, Total: 165 mg/dL (ref 100–199)
HDL: 48 mg/dL (ref >39)
LDL Chol Calc (NIH): 106 mg/dL — ABNORMAL HIGH (ref 0–99)
Triglycerides: 53 mg/dL (ref 0–149)
VLDL Cholesterol Cal: 11 mg/dL (ref 5–40)

## 2022-08-27 ENCOUNTER — Ambulatory Visit: Payer: Self-pay | Attending: Nurse Practitioner | Admitting: Nurse Practitioner

## 2022-08-27 ENCOUNTER — Encounter: Payer: Self-pay | Admitting: Nurse Practitioner

## 2022-08-27 ENCOUNTER — Other Ambulatory Visit: Payer: Self-pay

## 2022-08-27 VITALS — BP 160/94 | HR 69 | Temp 97.8°F | Ht 65.0 in | Wt 215.0 lb

## 2022-08-27 DIAGNOSIS — G8918 Other acute postprocedural pain: Secondary | ICD-10-CM

## 2022-08-27 DIAGNOSIS — Z794 Long term (current) use of insulin: Secondary | ICD-10-CM

## 2022-08-27 DIAGNOSIS — I10 Essential (primary) hypertension: Secondary | ICD-10-CM

## 2022-08-27 DIAGNOSIS — E1165 Type 2 diabetes mellitus with hyperglycemia: Secondary | ICD-10-CM

## 2022-08-27 MED ORDER — DICLOFENAC SODIUM 1 % EX GEL
4.0000 g | Freq: Four times a day (QID) | CUTANEOUS | 1 refills | Status: AC
  Filled 2022-08-27: qty 200, 13d supply, fill #0

## 2022-08-27 MED ORDER — DICLOFENAC SODIUM 1 % EX GEL
4.0000 g | Freq: Four times a day (QID) | CUTANEOUS | 1 refills | Status: DC
  Filled 2022-08-27: qty 200, fill #0

## 2022-08-27 NOTE — Progress Notes (Signed)
Assessment & Plan:  Kimberly Bryant was seen today for hypertension.  Diagnoses and all orders for this visit:  Primary hypertension Continue all antihypertensives as prescribed.  Reminded to bring in blood pressure log for follow  up appointment.  RECOMMENDATIONS: DASH/Mediterranean Diets are healthier choices for HTN.    Type 2 diabetes mellitus with hyperglycemia, with long-term current use of insulin (HCC) -     Hemoglobin A1c -     CMP14+EGFR  Pain at surgical site -     diclofenac Sodium (VOLTAREN) 1 % GEL; Apply 4 g topically 4 (four) times daily to areas of pain. Endorses sternal incision pain when she is sleeping and lying on her chest.    Patient has been counseled on age-appropriate routine health concerns for screening and prevention. These are reviewed and up-to-date. Referrals have been placed accordingly. Immunizations are up-to-date or declined.    Subjective:   Chief Complaint  Patient presents with   Hypertension   HPI Kimberly Bryant 66 y.o. female presents to office today for f/u to HTN   VRI was used to communicate directly with patient for the entire encounter including providing detailed patient instructions.     Blood pressures not well controlled.  She states blood pressures are normal at home with her monitor however she cannot recall any readings whatsoever today.  She is currently taking losartan 25 mg daily, amlodipine 10 mg daily and Toprol-XL 25 mg daily. BP Readings from Last 3 Encounters:  08/27/22 (!) 160/94  05/16/22 (!) 160/93  11/10/21 (!) 148/94      DM 2 Not taking insulin due to bleeding at injection site and itching. She is taking metformin 500 mg BID.  At this time we will continue metformin only and based on A1c we will decide on p.o. versus injection. She stopped taking basaglar a few days ago.  LDL is not at goal with high intensity statin. Lab Results  Component Value Date   HGBA1C 6.2 05/16/2022   Lab Results  Component Value  Date   LDLCALC 106 (H) 05/16/2022      Review of Systems  Constitutional:  Negative for fever, malaise/fatigue and weight loss.  HENT: Negative.  Negative for nosebleeds.   Eyes: Negative.  Negative for blurred vision, double vision and photophobia.  Respiratory: Negative.  Negative for cough and shortness of breath.   Cardiovascular: Negative.  Negative for chest pain, palpitations and leg swelling.  Gastrointestinal: Negative.  Negative for heartburn, nausea and vomiting.  Musculoskeletal: Negative.  Negative for myalgias.  Neurological: Negative.  Negative for dizziness, focal weakness, seizures and headaches.  Psychiatric/Behavioral: Negative.  Negative for suicidal ideas.     Past Medical History:  Diagnosis Date   CAD (coronary artery disease)    status post acute NSTEMI 05/2019 followed by CABG x4 with a LIMA to the LAD, SVG to OM, SVG to diagonal, SVG to distal LAD.    Hyperlipidemia    Hypertension    Pleural effusion 06/24/2019   LEFT    Past Surgical History:  Procedure Laterality Date   APPLICATION OF WOUND VAC N/A 06/23/2019   Procedure: APPLICATION OF WOUND VAC;  Surgeon: Wonda Olds, MD;  [facility];  Service: Thoracic;  Laterality: N/A;   CORONARY ARTERY BYPASS GRAFT N/A 06/10/2019   Procedure: CORONARY ARTERY BYPASS GRAFTING (CABG) x Four, using left internal mammary artery and right leg greater saphenous vein harvested endoscopically;  Surgeon: Wonda Olds, MD;  Location: [place];  Service: Open  Heart Surgery;  Laterality: N/A;   LEFT HEART CATH AND CORONARY ANGIOGRAPHY N/A 06/09/2019   Procedure: LEFT HEART CATH AND CORONARY ANGIOGRAPHY;  Surgeon: Troy Sine, MD;  Location: [place] [facility];  Service: Cardiovascular;  Laterality: N/A;   STERNAL WOUND DEBRIDEMENT N/A 06/23/2019   Procedure: STERNAL WOUND DEBRIDEMENT;  Surgeon: Wonda Olds, MD;  [facility];  Service: Thoracic;  Laterality: N/A;   TEE WITHOUT CARDIOVERSION N/A 06/10/2019    Procedure: TRANSESOPHAGEAL ECHOCARDIOGRAM (TEE);  Surgeon: Wonda Olds, MD;  Location: [place];  Service: Open Heart Surgery;  Laterality: N/A;    Family History  Problem Relation Age of Onset   Hypertension Mother     Social History Reviewed with no changes to be made today.   Outpatient Medications Prior to Visit  Medication Sig Dispense Refill   acetaminophen (TYLENOL) 325 MG tablet Take 650 mg by mouth every 6 (six) hours as needed for mild pain or moderate pain.      amLODipine (NORVASC) 10 MG tablet Take 1 tablet (10 mg total) by mouth daily. 90 tablet 1   Blood Glucose Monitoring Suppl (TRUE METRIX METER) w/Device KIT Use to check blood sugar once daily. 1 kit 0   furosemide (LASIX) 20 MG tablet Take 1 tablet (20 mg total) by mouth daily. 90 tablet 1   glucose blood (TRUE METRIX BLOOD GLUCOSE TEST) test strip Use to check blood sugar twice daily. 100 strip 2   Insulin Pen Needle 32G X 4 MM MISC Use once daily with Basaglar 100 each 11   Lidocaine 5 % CREA Apply to scar 3 times per day 40 g 1   losartan (COZAAR) 25 MG tablet Take 1 tablet (25 mg total) by mouth daily. 90 tablet 1   metFORMIN (GLUCOPHAGE) 500 MG tablet TAKE 2 TABLETS (1,000 MG TOTAL) BY MOUTH 2 (TWO) TIMES DAILY WITH A MEAL. 360 tablet 1   metoprolol succinate (TOPROL-XL) 25 MG 24 hr tablet Take 1 tablet (25 mg total) by mouth daily. 90 tablet 1   rosuvastatin (CRESTOR) 40 MG tablet Take 1 tablet (40 mg total) by mouth daily. 90 tablet 1   TRUEplus Lancets 28G MISC USE TO CHECK BLOOD SUGAR ONCE DAILY. 100 each 2   Insulin Glargine (BASAGLAR KWIKPEN) 100 UNIT/ML Inject 15 Units into the skin daily. (Patient not taking: Reported on 08/27/2022) 15 mL 6   No facility-administered medications prior to visit.    Allergies  Allergen Reactions   Atorvastatin Other (See Comments)    Made body feel flushed and hot all over   Carafate [Sucralfate] Swelling and Other (See Comments)    Patient stated it made her face  swell (angioedema)- no breathing issues, however   Lisinopril Swelling and Other (See Comments)    Patient stated it made her face swell (angioedema)- no breathing issues, however   Basaglar Kwikpen [Insulin Glargine] Rash       Objective:    BP (!) 160/94   Pulse 69   Temp 97.8 F (36.6 C) (Temporal)   Ht _0  (1.651 m)   Wt 215 lb (97.5 kg)   SpO2 98%   BMI 35.78 kg/m  Wt Readings from Last 3 Encounters:  08/27/22 215 lb (97.5 kg)  05/16/22 206 lb (93.4 kg)  11/10/21 208 lb 6 oz (94.5 kg)    Physical Exam Vitals and nursing note reviewed.  Constitutional:      Appearance: She is well-developed.  HENT:     Head:  Normocephalic and atraumatic.  Cardiovascular:     Rate and Rhythm: Normal rate and regular rhythm.     Heart sounds: Normal heart sounds. No murmur heard.    No friction rub. No gallop.  Pulmonary:     Effort: Pulmonary effort is normal. No tachypnea or respiratory distress.     Breath sounds: Normal breath sounds. No decreased breath sounds, wheezing, rhonchi or rales.  Chest:     Chest wall: No tenderness.  Abdominal:     General: Bowel sounds are normal.     Palpations: Abdomen is soft.  Musculoskeletal:        General: Normal range of motion.     Cervical back: Normal range of motion.  Skin:    General: Skin is warm and dry.     Findings: Rash present. Rash is macular.          Comments: Macular rash and numerous visible injection sites with scabbing.   Neurological:     Mental Status: She is alert and oriented to person, place, and time.     Coordination: Coordination normal.  Psychiatric:        Behavior: Behavior normal. Behavior is cooperative.        Thought Content: Thought content normal.        Judgment: Judgment normal.          Patient has been counseled extensively about nutrition and exercise as well as the importance of adherence with medications and regular follow-up. The patient was given clear instructions to go to ER or  return to medical center if symptoms don't improve, worsen or new problems develop. The patient verbalized understanding.   Follow-up: Return for BP check with Luke 3-4 weeks. See me in 3 months.   Gildardo Pounds, FNP-BC [facility] and [facility] [place], [place]   08/27/2022, 3:07 PM

## 2022-08-28 ENCOUNTER — Other Ambulatory Visit: Payer: Self-pay | Admitting: Nurse Practitioner

## 2022-08-28 ENCOUNTER — Other Ambulatory Visit: Payer: Self-pay

## 2022-08-28 DIAGNOSIS — E1165 Type 2 diabetes mellitus with hyperglycemia: Secondary | ICD-10-CM

## 2022-08-28 LAB — HEMOGLOBIN A1C
Est. average glucose Bld gHb Est-mCnc: 160 mg/dL
Hgb A1c MFr Bld: 7.2 % — ABNORMAL HIGH (ref 4.8–5.6)

## 2022-08-28 LAB — CMP14+EGFR
ALT: 17 IU/L (ref 0–32)
AST: 16 IU/L (ref 0–40)
Albumin/Globulin Ratio: 1.4 (ref 1.2–2.2)
Albumin: 4.2 g/dL (ref 3.9–4.9)
Alkaline Phosphatase: 77 IU/L (ref 44–121)
BUN/Creatinine Ratio: 19 (ref 12–28)
BUN: 20 mg/dL (ref 8–27)
Bilirubin Total: <0.2 mg/dL (ref 0.0–1.2)
CO2: 21 mmol/L (ref 20–29)
Calcium: 9.5 mg/dL (ref 8.7–10.3)
Chloride: 107 mmol/L — ABNORMAL HIGH (ref 96–106)
Creatinine, Ser: 1.05 mg/dL — ABNORMAL HIGH (ref 0.57–1.00)
Globulin, Total: 2.9 g/dL (ref 1.5–4.5)
Glucose: 113 mg/dL — ABNORMAL HIGH (ref 70–99)
Potassium: 4.7 mmol/L (ref 3.5–5.2)
Sodium: 145 mmol/L — ABNORMAL HIGH (ref 134–144)
Total Protein: 7.1 g/dL (ref 6.0–8.5)
eGFR: 59 mL/min/{1.73_m2} — ABNORMAL LOW (ref >59)

## 2022-08-28 MED ORDER — EMPAGLIFLOZIN 10 MG PO TABS
10.0000 mg | ORAL_TABLET | Freq: Every day | ORAL | 3 refills | Status: DC
  Filled 2022-08-28: qty 30, 30d supply, fill #0

## 2022-09-04 ENCOUNTER — Other Ambulatory Visit: Payer: Self-pay

## 2022-10-03 ENCOUNTER — Encounter: Payer: Self-pay | Admitting: Family Medicine

## 2022-10-03 NOTE — Progress Notes (Unsigned)
   S:     PCP: Kimberly Bryant  66 y.o. female who presents for hypertension evaluation, education, and management. PMH is significant for HTN, CAD, NSTEMI s/p CABG x4, HLD, T2DM  Patient was referred and last seen by Primary Care Provider, Kimberly Bryant, on 08/27/2022.   At last visit, BP was elevated at 160/94. Patient stated that her BP is controlled at home, however, she could not recall any readings.    Today, patient arrives in good spirits and presents without assistance. Visit was conducted via a Nurse, learning disability. Denies dizziness, headache, blurred vision, swelling.   Family/Social history:  -Fhx: HTN -Tobacco: never -Alcohol: never  Medication adherence suboptimal. Patient has not taken BP medications today. She could not confirm any medications that she is taking, stating at one point that she only takes 1 medication per day then later stating 5 medications per day. Additionally, she states that she utilizes [facility], however, all of her blood pressure medications were sent [facility].   Current antihypertensives include: amlodipine 10mg  one daily, losartan 25mg  once daily, Toprol XL 25mg  once daily   Antihypertensives tried in the past include: lisinopril (lip swelling)  Reported home BP readings: not checking  Patient reported dietary habits:  Breakfast: cereal or eggs Dinner: rice, soup  -stated she eats a lot of plantains  -eats goat and poultry  -only eats food at home and infrequent fried foods  Patient-reported exercise habits:  -has leg pain so limits her physical   O:  Vitals:   10/04/22 1050  BP: (!) 157/87  Pulse: 69   Last 3 Office BP readings: BP Readings from Last 3 Encounters:  08/27/22 (!) 160/94  05/16/22 (!) 160/93  11/10/21 (!) 148/94   BMET    Component Value Date/Time   NA 145 (H) 08/27/2022 1127   K 4.7 08/27/2022 1127   CL 107 (H) 08/27/2022 1127   CO2 21 08/27/2022 1127   GLUCOSE 113 (H) 08/27/2022 1127   GLUCOSE  148 (H) 09/07/2020 0106   BUN 20 08/27/2022 1127   CREATININE 1.05 (H) 08/27/2022 1127   CALCIUM 9.5 08/27/2022 1127   GFRNONAA >60 09/07/2020 0106   GFRAA 67 02/23/2020 1126    Renal function: CrCl cannot be calculated (Patient's most recent lab result is older than the maximum 21 days allowed.).  Clinical ASCVD: Yes  The ASCVD Risk score (Arnett DK, et al., 2019) failed to calculate for the following reasons:   The patient has a prior MI or stroke diagnosis   A/P: Hypertension diagnosed currently uncontrolled on current medications. BP goal < 130/80 mmHg. Medication adherence appears suboptimal. Control is suboptimal due to medication non-adherence.  -Continued current antihypertensive therapies. -Instructed patient to return with all of her pill bottles as we cannot make any adjustments without knowing which medications she is utilizing.   -Counseled on lifestyle modifications for blood pressure control including reduced dietary sodium, increased exercise, adequate sleep. -Encouraged patient to check BP at home and bring log of readings to next visit. Counseled on proper use of home BP cuff.    Results reviewed and written information provided.    Written patient instructions provided. Patient verbalized understanding of treatment plan.  Total time in face to face counseling 20 minutes.    Follow-up:  PCP clinic visit in 11/27/2022.   02/25/2020, PharmD PGY-1 [facility] Pharm[facility] Resident

## 2022-10-04 ENCOUNTER — Ambulatory Visit: Payer: Self-pay | Attending: Nurse Practitioner | Admitting: Pharmacist

## 2022-10-04 ENCOUNTER — Other Ambulatory Visit: Payer: Self-pay

## 2022-10-04 DIAGNOSIS — E785 Hyperlipidemia, unspecified: Secondary | ICD-10-CM

## 2022-10-04 DIAGNOSIS — I10 Essential (primary) hypertension: Secondary | ICD-10-CM

## 2022-10-04 DIAGNOSIS — E1165 Type 2 diabetes mellitus with hyperglycemia: Secondary | ICD-10-CM

## 2022-10-04 DIAGNOSIS — Z794 Long term (current) use of insulin: Secondary | ICD-10-CM

## 2022-10-04 MED ORDER — FUROSEMIDE 20 MG PO TABS
20.0000 mg | ORAL_TABLET | Freq: Every day | ORAL | 1 refills | Status: DC
  Filled 2022-10-04: qty 90, 90d supply, fill #0

## 2022-10-04 MED ORDER — LOSARTAN POTASSIUM 25 MG PO TABS
25.0000 mg | ORAL_TABLET | Freq: Every day | ORAL | 1 refills | Status: DC
  Filled 2022-10-04: qty 90, 90d supply, fill #0

## 2022-10-04 MED ORDER — METFORMIN HCL 500 MG PO TABS
ORAL_TABLET | Freq: Two times a day (BID) | ORAL | 1 refills | Status: DC
  Filled 2022-10-04: qty 360, 90d supply, fill #0

## 2022-10-04 MED ORDER — AMLODIPINE BESYLATE 10 MG PO TABS
10.0000 mg | ORAL_TABLET | Freq: Every day | ORAL | 1 refills | Status: DC
  Filled 2022-10-04: qty 90, 90d supply, fill #0

## 2022-10-04 MED ORDER — ROSUVASTATIN CALCIUM 40 MG PO TABS
40.0000 mg | ORAL_TABLET | Freq: Every day | ORAL | 1 refills | Status: DC
  Filled 2022-10-04: qty 90, 90d supply, fill #0

## 2022-10-04 MED ORDER — METOPROLOL SUCCINATE ER 25 MG PO TB24
25.0000 mg | ORAL_TABLET | Freq: Every day | ORAL | 1 refills | Status: DC
  Filled 2022-10-04: qty 90, 90d supply, fill #0

## 2022-10-23 ENCOUNTER — Other Ambulatory Visit: Payer: Self-pay

## 2022-11-27 ENCOUNTER — Other Ambulatory Visit: Payer: Self-pay

## 2022-11-27 ENCOUNTER — Ambulatory Visit: Payer: Self-pay | Attending: Nurse Practitioner | Admitting: Nurse Practitioner

## 2022-11-27 ENCOUNTER — Encounter: Payer: Self-pay | Admitting: Nurse Practitioner

## 2022-11-27 VITALS — BP 148/87 | HR 72 | Ht 65.0 in | Wt 214.0 lb

## 2022-11-27 DIAGNOSIS — Z794 Long term (current) use of insulin: Secondary | ICD-10-CM

## 2022-11-27 DIAGNOSIS — E1165 Type 2 diabetes mellitus with hyperglycemia: Secondary | ICD-10-CM

## 2022-11-27 DIAGNOSIS — I10 Essential (primary) hypertension: Secondary | ICD-10-CM

## 2022-11-27 DIAGNOSIS — Z1231 Encounter for screening mammogram for malignant neoplasm of breast: Secondary | ICD-10-CM

## 2022-11-27 DIAGNOSIS — E785 Hyperlipidemia, unspecified: Secondary | ICD-10-CM

## 2022-11-27 DIAGNOSIS — Z1211 Encounter for screening for malignant neoplasm of colon: Secondary | ICD-10-CM

## 2022-11-27 LAB — POCT GLYCOSYLATED HEMOGLOBIN (HGB A1C): HbA1c, POC (controlled diabetic range): 6.7 % (ref 0.0–7.0)

## 2022-11-27 LAB — GLUCOSE, POCT (MANUAL RESULT ENTRY): POC Glucose: 127 mg/dl — AB (ref 70–99)

## 2022-11-27 MED ORDER — AMLODIPINE BESYLATE 10 MG PO TABS
10.0000 mg | ORAL_TABLET | Freq: Every day | ORAL | 1 refills | Status: DC
  Filled 2022-11-27: qty 30, 30d supply, fill #0

## 2022-11-27 MED ORDER — METFORMIN HCL 500 MG PO TABS
1000.0000 mg | ORAL_TABLET | Freq: Two times a day (BID) | ORAL | 1 refills | Status: DC
  Filled 2022-11-27: qty 360, fill #0

## 2022-11-27 MED ORDER — METOPROLOL SUCCINATE ER 25 MG PO TB24
25.0000 mg | ORAL_TABLET | Freq: Every day | ORAL | 1 refills | Status: DC
  Filled 2022-11-27: qty 30, 30d supply, fill #0

## 2022-11-27 MED ORDER — ROSUVASTATIN CALCIUM 40 MG PO TABS
40.0000 mg | ORAL_TABLET | Freq: Every day | ORAL | 1 refills | Status: DC
  Filled 2022-11-27: qty 90, 90d supply, fill #0

## 2022-11-27 MED ORDER — LOSARTAN POTASSIUM 25 MG PO TABS
25.0000 mg | ORAL_TABLET | Freq: Every day | ORAL | 1 refills | Status: DC
  Filled 2022-11-27: qty 30, 30d supply, fill #0

## 2022-11-27 NOTE — Progress Notes (Signed)
Assessment & Plan:  Dedria was seen today for diabetes.  Diagnoses and all orders for this visit:  Type 2 diabetes mellitus with hyperglycemia, with long-term current use of insulin (HCC) -     POCT glycosylated hemoglobin (Hb A1C) -     POCT glucose (manual entry) -     metFORMIN (GLUCOPHAGE) 500 MG tablet; Take 2 tablets (1,000 mg total) by mouth 2 (two) times daily with a meal. -     Microalbumin / creatinine urine ratio -     CMP14+EGFR Continue blood sugar control as discussed in office today, low carbohydrate diet, and regular physical exercise as tolerated, 150 minutes per week (30 min each day, 5 days per week, or 50 min 3 days per week). Keep blood sugar logs with fasting goal of 90-130 mg/dl, post prandial (after you eat) less than 180.  For Hypoglycemia: BS <60 and Hyperglycemia BS >400; contact the clinic ASAP. Annual eye exams and foot exams are recommended.   Primary hypertension -     amLODipine (NORVASC) 10 MG tablet; Take 1 tablet (10 mg total) by mouth daily. -     losartan (COZAAR) 25 MG tablet; Take 1 tablet (25 mg total) by mouth daily. -     metoprolol succinate (TOPROL-XL) 25 MG 24 hr tablet; Take 1 tablet (25 mg total) by mouth daily. -     CMP14+EGFR Continue all antihypertensives as prescribed.  Reminded to bring in blood pressure log for follow  up appointment.  RECOMMENDATIONS: DASH/Mediterranean Diets are healthier choices for HTN.    Dyslipidemia, goal LDL below 70 -     rosuvastatin (CRESTOR) 40 MG tablet; Take 1 tablet (40 mg total) by mouth daily. INSTRUCTIONS: Work on a low fat, heart healthy diet and participate in regular aerobic exercise program by working out at least 150 minutes per week; 5 days a week-30 minutes per day. Avoid red meat/beef/steak,  fried foods. junk foods, sodas, sugary drinks, unhealthy snacking, alcohol and smoking.  Drink at least 80 oz of water per day and monitor your carbohydrate intake daily.    Colon cancer screening -      Fecal occult blood, imunochemical([facility])  Breast cancer screening by mammogram -     MS DIGITAL SCREENING TOMO BILATERAL; Future    Patient has been counseled on age-appropriate routine health concerns for screening and prevention. These are reviewed and up-to-date. Referrals have been placed accordingly. Immunizations are up-to-date or declined.    Subjective:   Chief Complaint  Patient presents with   Diabetes   Diabetes Pertinent negatives for hypoglycemia include no dizziness, headaches or seizures. Pertinent negatives for diabetes include no blurred vision, no chest pain and no weight loss.   Kimberly Bryant 67 y.o. female presents to office today for follow up to HTN and DM  PMH is significant for HTN, CAD, NSTEMI s/p CABG x4, HLD, T2DM    VRI was used to communicate directly with patient for the entire encounter including providing detailed patient instructions.     HTN Blood pressure is elevated today.  She does check her blood pressure at home and reports average readings 130-140/70-80s.  I have instructed her to bring her blood pressure monitoring device to her next office visit for correlation as this will help [place] decide if medications need to be adjusted. BP Readings from Last 3 Encounters:  11/27/22 (!) 148/87  10/04/22 (!) 157/87  08/27/22 (!) 160/94  Current antihypertensives include: amlodipine 10mg  one daily, losartan 25mg  once  daily, Toprol XL 25mg  once daily  Antihypertensives tried in the past include: lisinopril (lip swelling)    DM 2 Diabetes is well-controlled with metformin 1000 mg twice daily.  She is not taking Jardiance Lab Results  Component Value Date   HGBA1C 6.7 11/27/2022    Lab Results  Component Value Date   HGBA1C 7.2 (H) 08/27/2022  LDL not at goal. She is not taking crestor.  I instructed her that this medication is very important to take every day as prescribed to help reduce her risk of heart attack or stroke. Lab Results   Component Value Date   LDLCALC 106 (H) 05/16/2022    Review of Systems  Constitutional:  Negative for fever, malaise/fatigue and weight loss.  HENT: Negative.  Negative for nosebleeds.   Eyes: Negative.  Negative for blurred vision, double vision and photophobia.  Respiratory: Negative.  Negative for cough and shortness of breath.   Cardiovascular: Negative.  Negative for chest pain, palpitations and leg swelling.  Gastrointestinal: Negative.  Negative for heartburn, nausea and vomiting.  Musculoskeletal: Negative.  Negative for myalgias.  Neurological: Negative.  Negative for dizziness, focal weakness, seizures and headaches.  Psychiatric/Behavioral: Negative.  Negative for suicidal ideas.     Past Medical History:  Diagnosis Date   CAD (coronary artery disease)    status post acute NSTEMI 05/2019 followed by CABG x4 with a LIMA to the LAD, SVG to OM, SVG to diagonal, SVG to distal LAD.    Hyperlipidemia    Hypertension    Pleural effusion 06/24/2019   LEFT    Past Surgical History:  Procedure Laterality Date   APPLICATION OF WOUND VAC N/A 06/23/2019   Procedure: APPLICATION OF WOUND VAC;  Surgeon: Wonda Olds, MD;  [facility];  Service: Thoracic;  Laterality: N/A;   CORONARY ARTERY BYPASS GRAFT N/A 06/10/2019   Procedure: CORONARY ARTERY BYPASS GRAFTING (CABG) x Four, using left internal mammary artery and right leg greater saphenous vein harvested endoscopically;  Surgeon: Wonda Olds, MD;  Location: [place];  Service: Open Heart Surgery;  Laterality: N/A;   LEFT HEART CATH AND CORONARY ANGIOGRAPHY N/A 06/09/2019   Procedure: LEFT HEART CATH AND CORONARY ANGIOGRAPHY;  Surgeon: Troy Sine, MD;  Location: [place] [facility];  Service: Cardiovascular;  Laterality: N/A;   STERNAL WOUND DEBRIDEMENT N/A 06/23/2019   Procedure: STERNAL WOUND DEBRIDEMENT;  Surgeon: Wonda Olds, MD;  [facility];  Service: Thoracic;  Laterality: N/A;   TEE WITHOUT CARDIOVERSION  N/A 06/10/2019   Procedure: TRANSESOPHAGEAL ECHOCARDIOGRAM (TEE);  Surgeon: Wonda Olds, MD;  Location: [place];  Service: Open Heart Surgery;  Laterality: N/A;    Family History  Problem Relation Age of Onset   Hypertension Mother     Social History Reviewed with no changes to be made today.   Outpatient Medications Prior to Visit  Medication Sig Dispense Refill   Blood Glucose Monitoring Suppl (TRUE METRIX METER) w/Device KIT Use to check blood sugar once daily. 1 kit 0   furosemide (LASIX) 20 MG tablet Take 1 tablet (20 mg total) by mouth daily. 90 tablet 1   glucose blood (TRUE METRIX BLOOD GLUCOSE TEST) test strip Use to check blood sugar twice daily. 100 strip 2   TRUEplus Lancets 28G MISC USE TO CHECK BLOOD SUGAR ONCE DAILY. 100 each 2   amLODipine (NORVASC) 10 MG tablet Take 1 tablet (10 mg total) by mouth daily. 90 tablet 1   losartan (COZAAR) 25 MG  tablet Take 1 tablet (25 mg total) by mouth daily. 90 tablet 1   metFORMIN (GLUCOPHAGE) 500 MG tablet TAKE 2 TABLETS (1,000 MG TOTAL) BY MOUTH 2 (TWO) TIMES DAILY WITH A MEAL. 360 tablet 1   metoprolol succinate (TOPROL-XL) 25 MG 24 hr tablet Take 1 tablet (25 mg total) by mouth daily. 90 tablet 1   Insulin Pen Needle 32G X 4 MM MISC Use once daily with Basaglar (Patient not taking: Reported on 11/27/2022) 100 each 11   acetaminophen (TYLENOL) 325 MG tablet Take 650 mg by mouth every 6 (six) hours as needed for mild pain or moderate pain.  (Patient not taking: Reported on 11/27/2022)     empagliflozin (JARDIANCE) 10 MG TABS tablet Take 1 tablet (10 mg total) by mouth daily before breakfast. (Patient not taking: Reported on 11/27/2022) 30 tablet 3   Lidocaine 5 % CREA Apply to scar 3 times per day (Patient not taking: Reported on 11/27/2022) 40 g 1   rosuvastatin (CRESTOR) 40 MG tablet Take 1 tablet (40 mg total) by mouth daily. (Patient not taking: Reported on 11/27/2022) 90 tablet 1   No facility-administered medications prior to visit.     Allergies  Allergen Reactions   Atorvastatin Other (See Comments)    Made body feel flushed and hot all over   Carafate [Sucralfate] Swelling and Other (See Comments)    Patient stated it made her face swell (angioedema)- no breathing issues, however   Lisinopril Swelling and Other (See Comments)    Patient stated it made her face swell (angioedema)- no breathing issues, however   Basaglar Kwikpen [Insulin Glargine] Rash       Objective:    BP (!) 148/87   Pulse 72   Ht 5\' 5"  (1.651 m)   Wt 214 lb (97.1 kg)   SpO2 98%   BMI 35.61 kg/m  Wt Readings from Last 3 Encounters:  11/27/22 214 lb (97.1 kg)  08/27/22 215 lb (97.5 kg)  05/16/22 206 lb (93.4 kg)    Physical Exam Vitals and nursing note reviewed.  Constitutional:      Appearance: She is well-developed.  HENT:     Head: Normocephalic and atraumatic.  Cardiovascular:     Rate and Rhythm: Normal rate and regular rhythm.     Heart sounds: Normal heart sounds. No murmur heard.    No friction rub. No gallop.  Pulmonary:     Effort: Pulmonary effort is normal. No tachypnea or respiratory distress.     Breath sounds: Normal breath sounds. No decreased breath sounds, wheezing, rhonchi or rales.  Chest:     Chest wall: No tenderness.  Abdominal:     General: Bowel sounds are normal.     Palpations: Abdomen is soft.  Musculoskeletal:        General: Normal range of motion.     Cervical back: Normal range of motion.  Skin:    General: Skin is warm and dry.  Neurological:     Mental Status: She is alert and oriented to person, place, and time.     Coordination: Coordination normal.  Psychiatric:        Behavior: Behavior normal. Behavior is cooperative.        Thought Content: Thought content normal.        Judgment: Judgment normal.          Patient has been counseled extensively about nutrition and exercise as well as the importance of adherence with medications and regular follow-up. The patient  was  given clear instructions to go to ER or return to medical center if symptoms don't improve, worsen or new problems develop. The patient verbalized understanding.   Follow-up: Return in about 3 months (around 02/26/2023).   Claiborne Rigg, FNP-BC [facility] [place], [place] [phone]   11/27/2022, 2:21 PM

## 2022-11-28 LAB — CMP14+EGFR
ALT: 20 IU/L (ref 0–32)
AST: 18 IU/L (ref 0–40)
Albumin/Globulin Ratio: 1.5 (ref 1.2–2.2)
Albumin: 4.5 g/dL (ref 3.9–4.9)
Alkaline Phosphatase: 80 IU/L (ref 44–121)
BUN/Creatinine Ratio: 9 — ABNORMAL LOW (ref 12–28)
BUN: 9 mg/dL (ref 8–27)
Bilirubin Total: <0.2 mg/dL (ref 0.0–1.2)
CO2: 23 mmol/L (ref 20–29)
Calcium: 9.3 mg/dL (ref 8.7–10.3)
Chloride: 103 mmol/L (ref 96–106)
Creatinine, Ser: 0.95 mg/dL (ref 0.57–1.00)
Globulin, Total: 3 g/dL (ref 1.5–4.5)
Glucose: 119 mg/dL — ABNORMAL HIGH (ref 70–99)
Potassium: 4.6 mmol/L (ref 3.5–5.2)
Sodium: 142 mmol/L (ref 134–144)
Total Protein: 7.5 g/dL (ref 6.0–8.5)
eGFR: 66 mL/min/{1.73_m2} (ref >59)

## 2022-11-28 LAB — MICROALBUMIN / CREATININE URINE RATIO
Creatinine, Urine: 192.1 mg/dL
Microalb/Creat Ratio: 137 mg/g creat — ABNORMAL HIGH (ref 0–29)
Microalbumin, Urine: 263.1 ug/mL

## 2023-01-30 ENCOUNTER — Ambulatory Visit: Payer: Self-pay

## 2023-02-04 ENCOUNTER — Other Ambulatory Visit: Payer: Self-pay | Admitting: Nurse Practitioner

## 2023-02-04 DIAGNOSIS — I10 Essential (primary) hypertension: Secondary | ICD-10-CM

## 2023-02-04 NOTE — Telephone Encounter (Signed)
Medication Refill - Medication: amLODipine (NORVASC) 10 MG tablet /losartan (COZAAR) 25 MG tablet   Has the patient contacted their pharmacy? yes (Agent: If no, request that the patient contact the pharmacy for the refill. If patient does not wish to contact the pharmacy document the reason why and proceed with request.) (Agent: If yes, when and what did the pharmacy advise?)contact pcp  Preferred Pharmacy (with phone number or street name):  [facility], [address] Phone: [phone]  Fax: [phone]     Has the patient been seen for an appointment in the last year OR does the patient have an upcoming appointment? yes  Agent: Please be advised that RX refills may take up to 3 business days. We ask that you follow-up with your pharmacy.

## 2023-02-05 MED ORDER — AMLODIPINE BESYLATE 10 MG PO TABS: 10.0000 mg | ORAL_TABLET | Freq: Every day | ORAL | 0 refills | Status: DC

## 2023-02-05 MED ORDER — LOSARTAN POTASSIUM 25 MG PO TABS: 25.0000 mg | ORAL_TABLET | Freq: Every day | ORAL | 0 refills | Status: DC

## 2023-02-05 NOTE — Telephone Encounter (Signed)
Change in pharmacy- remainder of Rx forwarded Requested Prescriptions  Pending Prescriptions Disp Refills   amLODipine (NORVASC) 10 MG tablet 90 tablet 1    Sig: Take 1 tablet (10 mg total) by mouth daily.     Cardiovascular: Calcium Channel Blockers 2 Failed - 02/04/2023 12:10 PM      Failed - Last BP in normal range    BP Readings from Last 1 Encounters:  11/27/22 (!) 148/87         Passed - Last Heart Rate in normal range    Pulse Readings from Last 1 Encounters:  11/27/22 72         Passed - Valid encounter within last 6 months    Recent Outpatient Visits           2 months ago Type 2 diabetes mellitus with hyperglycemia, with long-term current use of insulin [facility])   [place], Vernia Buff, NP   4 months ago Primary hypertension   [place], Jarome Matin, RPH-CPP   5 months ago Primary hypertension   [place], Vernia Buff, NP   8 months ago Primary hypertension   [place], Vernia Buff, NP   1 year ago Primary hypertension   [place], Vernia Buff, NP       Future Appointments             In 3 weeks Gildardo Pounds, NP [place]             losartan (COZAAR) 25 MG tablet 90 tablet 1    Sig: Take 1 tablet (25 mg total) by mouth daily.     Cardiovascular:  Angiotensin Receptor Blockers Failed - 02/04/2023 12:10 PM      Failed - Last BP in normal range    BP Readings from Last 1 Encounters:  11/27/22 (!) 148/87         Passed - Cr in normal range and within 180 days    Creatinine, Ser  Date Value Ref Range Status  11/27/2022 0.95 0.57 - 1.00 mg/dL Final         Passed - K in normal range and within 180 days    Potassium  Date Value Ref Range Status  11/27/2022 4.6 3.5 - 5.2 mmol/L Final         Passed -  Patient is not pregnant      Passed - Valid encounter within last 6 months    Recent Outpatient Visits           2 months ago Type 2 diabetes mellitus with hyperglycemia, with long-term current use of insulin [facility])   [place], Vernia Buff, NP   4 months ago Primary hypertension   [place], Jarome Matin, RPH-CPP   5 months ago Primary hypertension   [place], NP   8 months ago Primary hypertension   [place], Vernia Buff, NP   1 year ago Primary hypertension   [place] Gildardo Pounds, NP       Future Appointments             In  3 weeks Gildardo Pounds, NP [place]

## 2023-02-27 ENCOUNTER — Ambulatory Visit: Payer: Self-pay | Admitting: Nurse Practitioner

## 2023-04-17 ENCOUNTER — Encounter: Payer: Self-pay | Admitting: Nurse Practitioner

## 2023-04-17 ENCOUNTER — Ambulatory Visit: Payer: Self-pay | Attending: Nurse Practitioner | Admitting: Nurse Practitioner

## 2023-04-17 ENCOUNTER — Other Ambulatory Visit: Payer: Self-pay

## 2023-04-17 VITALS — BP 125/81 | HR 83 | Ht 65.0 in | Wt 216.4 lb

## 2023-04-17 DIAGNOSIS — I5032 Chronic diastolic (congestive) heart failure: Secondary | ICD-10-CM

## 2023-04-17 DIAGNOSIS — I10 Essential (primary) hypertension: Secondary | ICD-10-CM

## 2023-04-17 DIAGNOSIS — R002 Palpitations: Secondary | ICD-10-CM

## 2023-04-17 DIAGNOSIS — E785 Hyperlipidemia, unspecified: Secondary | ICD-10-CM

## 2023-04-17 MED ORDER — ROSUVASTATIN CALCIUM 40 MG PO TABS
40.0000 mg | ORAL_TABLET | Freq: Every day | ORAL | 1 refills | Status: DC
Start: 2023-04-17 — End: 2023-09-05
  Filled 2023-04-17: qty 90, 90d supply, fill #0

## 2023-04-17 MED ORDER — METOPROLOL SUCCINATE ER 25 MG PO TB24
25.0000 mg | ORAL_TABLET | Freq: Every day | ORAL | 1 refills | Status: DC
  Filled 2023-04-17: qty 90, 90d supply, fill #0

## 2023-04-17 MED ORDER — AMLODIPINE BESYLATE 10 MG PO TABS
10.0000 mg | ORAL_TABLET | Freq: Every day | ORAL | 1 refills | Status: DC
  Filled 2023-04-17: qty 90, 90d supply, fill #0

## 2023-04-17 MED ORDER — LOSARTAN POTASSIUM 25 MG PO TABS
25.0000 mg | ORAL_TABLET | Freq: Every day | ORAL | 1 refills | Status: DC
  Filled 2023-04-17: qty 90, 90d supply, fill #0

## 2023-04-17 NOTE — Patient Instructions (Signed)
Placed in [facility]  [address] [place], [place] [address] PH# [phone]

## 2023-04-17 NOTE — Progress Notes (Signed)
Assessment & Plan:  Kimberly Bryant was seen today for hypertension.  Diagnoses and all orders for this visit:  Primary hypertension -     amLODipine (NORVASC) 10 MG tablet; Take 1 tablet (10 mg total) by mouth daily. -     losartan (COZAAR) 25 MG tablet; Take 1 tablet (25 mg total) by mouth daily. -     metoprolol succinate (TOPROL-XL) 25 MG 24 hr tablet; Take 1 tablet (25 mg total) by mouth daily. -     CMP14+EGFR  Dyslipidemia, goal LDL below 70 -     rosuvastatin (CRESTOR) 40 MG tablet; Take 1 tablet (40 mg total) by mouth daily.  Palpitation -     Ambulatory referral to Cardiology  Chronic diastolic heart failure (HCC) Asymptomatic She does not take lasix -     Ambulatory referral to Cardiology    Patient has been counseled on age-appropriate routine health concerns for screening and prevention. These are reviewed and up-to-date. Referrals have been placed accordingly. Immunizations are up-to-date or declined.    Subjective:   Chief Complaint  Patient presents with   Hypertension   HPI SUE-ANNE VANKLEY 67 y.o. female presents to office today for follow up to HTN  Patient has been counseled on age-appropriate routine health concerns for screening and prevention. These are reviewed and up-to-date. Referrals have been placed accordingly. Immunizations are up-to-date or declined.     MAMMOGRAM: OVERDUE. She missed her last appt. States she will reschedule this as soon as possible COLONOSCOPY: Fecal test positive for blood. She has been instructed to follow up with GI as they tried to contact her to schedule an appointment for colonoscopy.  HTN Blood pressure is well-controlled today with amlodipine 10 mg, metoprolol 25 mg and losartan 25 mg daily.  She endorses intermittent palpitations and chest pain not associated with rest or activity.  She does have a history of NTEMI and CABG and has been lost to follow-up with cardiology over the years.  She denies any chest pain or shortness of  breath today.  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  BP Readings from Last 3 Encounters:  04/17/23 125/81  11/27/22 (!) 148/87  10/04/22 (!) 157/87     Review of Systems  Constitutional:  Negative for fever, malaise/fatigue and weight loss.  HENT: Negative.  Negative for nosebleeds.   Eyes: Negative.  Negative for blurred vision, double vision and photophobia.  Respiratory: Negative.  Negative for cough and shortness of breath.   Cardiovascular:  Positive for chest pain and palpitations. Negative for orthopnea, claudication, leg swelling and PND.  Gastrointestinal: Negative.  Negative for heartburn, nausea and vomiting.  Musculoskeletal: Negative.  Negative for myalgias.  Neurological: Negative.  Negative for dizziness, focal weakness, seizures and headaches.  Psychiatric/Behavioral: Negative.  Negative for suicidal ideas.     Past Medical History:  Diagnosis Date   CAD (coronary artery disease)    status post acute NSTEMI 05/2019 followed by CABG x4 with a LIMA to the LAD, SVG to OM, SVG to diagonal, SVG to distal LAD.    Hyperlipidemia    Hypertension    Pleural effusion 06/24/2019   LEFT    Past Surgical History:  Procedure Laterality Date   APPLICATION OF WOUND VAC N/A 06/23/2019   Procedure: APPLICATION OF WOUND VAC;  Surgeon: Linden Dolin, MD;  [facility];  Service: Thoracic;  Laterality: N/A;  CORONARY ARTERY BYPASS GRAFT N/A 06/10/2019   Procedure: CORONARY ARTERY BYPASS GRAFTING (CABG) x Four, using left internal mammary artery and right leg greater saphenous vein harvested endoscopically;  Surgeon: Linden Dolin, MD;  Location[facility];  Service: Open Heart Surgery;  Laterality: N/A;   LEFT HEART CATH AND CORONARY ANGIOGRAPHY N/A 06/09/2019   Procedure: LEFT HEART CATH AND CORONARY ANGIOGRAPHY;  Surgeon: Lennette Bihari, MD;  [facility];  Service:  Cardiovascular;  Laterality: N/A;   STERNAL WOUND DEBRIDEMENT N/A 06/23/2019   Procedure: STERNAL WOUND DEBRIDEMENT;  Surgeon: Linden Dolin, MD;  [facility];  Service: Thoracic;  Laterality: N/A;   TEE WITHOUT CARDIOVERSION N/A 06/10/2019   Procedure: TRANSESOPHAGEAL ECHOCARDIOGRAM (TEE);  Surgeon: Linden Dolin, MD;  Location: [facility] OR;  Service: Open Heart Surgery;  Laterality: N/A;    Family History  Problem Relation Age of Onset   Hypertension Mother     Social History Reviewed with no changes to be made today.   Outpatient Medications Prior to Visit  Medication Sig Dispense Refill   Blood Glucose Monitoring Suppl (TRUE METRIX METER) w/Device KIT Use to check blood sugar once daily. 1 kit 0   furosemide (LASIX) 20 MG tablet Take 1 tablet (20 mg total) by mouth daily. 90 tablet 1   glucose blood (TRUE METRIX BLOOD GLUCOSE TEST) test strip Use to check blood sugar twice daily. 100 strip 2   Insulin Pen Needle 32G X 4 MM MISC Use once daily with Basaglar 100 each 11   metFORMIN (GLUCOPHAGE) 500 MG tablet Take 2 tablets (1,000 mg total) by mouth 2 (two) times daily with a meal. 360 tablet 1   TRUEplus Lancets 28G MISC USE TO CHECK BLOOD SUGAR ONCE DAILY. 100 each 2   amLODipine (NORVASC) 10 MG tablet Take 1 tablet (10 mg total) by mouth daily. 90 tablet 0   losartan (COZAAR) 25 MG tablet Take 1 tablet (25 mg total) by mouth daily. 90 tablet 0   metoprolol succinate (TOPROL-XL) 25 MG 24 hr tablet Take 1 tablet (25 mg total) by mouth daily. 90 tablet 1   rosuvastatin (CRESTOR) 40 MG tablet Take 1 tablet (40 mg total) by mouth daily. 90 tablet 1   No facility-administered medications prior to visit.    Allergies  Allergen Reactions   Atorvastatin Other (See Comments)    Made body feel flushed and hot all over   Carafate [Sucralfate] Swelling and Other (See Comments)    Patient stated it made her face swell (angioedema)- no breathing issues, however   Lisinopril Swelling and  Other (See Comments)    Patient stated it made her face swell (angioedema)- no breathing issues, however   Basaglar Kwikpen [Insulin Glargine] Rash       Objective:    BP 125/81 (BP Location: Left Arm, Patient Position: Sitting, Cuff Size: Large)   Pulse 83   Ht 5\' 5"  (1.651 m)   Wt 216 lb 6.4 oz (98.2 kg)   SpO2 98%   BMI 36.01 kg/m  Wt Readings from Last 3 Encounters:  04/17/23 216 lb 6.4 oz (98.2 kg)  11/27/22 214 lb (97.1 kg)  08/27/22 215 lb (97.5 kg)    Physical Exam Vitals and nursing note reviewed.  Constitutional:      Appearance: She is well-developed.  HENT:     Head: Normocephalic and atraumatic.  Cardiovascular:     Rate and Rhythm: Normal rate and regular rhythm.     Heart  sounds: Normal heart sounds. No murmur heard.    No friction rub. No gallop.  Pulmonary:     Effort: Pulmonary effort is normal. No tachypnea or respiratory distress.     Breath sounds: Normal breath sounds. No decreased breath sounds, wheezing, rhonchi or rales.  Chest:     Chest wall: No tenderness.  Abdominal:     General: Bowel sounds are normal.     Palpations: Abdomen is soft.  Musculoskeletal:        General: Normal range of motion.     Cervical back: Normal range of motion.  Skin:    General: Skin is warm and dry.  Neurological:     Mental Status: She is alert and oriented to person, place, and time.     Coordination: Coordination normal.  Psychiatric:        Behavior: Behavior normal. Behavior is cooperative.        Thought Content: Thought content normal.        Judgment: Judgment normal.          Patient has been counseled extensively about nutrition and exercise as well as the importance of adherence with medications and regular follow-up. The patient was given clear instructions to go to ER or return to medical center if symptoms don't improve, worsen or new problems develop. The patient verbalized understanding.   Follow-up: Return in about 3 months (around  07/18/2023).   Claiborne Rigg, FNP-BC [facility] [place], [place] [phone]   04/17/2023, 1:38 PM

## 2023-04-18 LAB — CMP14+EGFR
ALT: 26 IU/L (ref 0–32)
AST: 18 IU/L (ref 0–40)
Albumin/Globulin Ratio: 1.4
Albumin: 4.2 g/dL (ref 3.9–4.9)
Alkaline Phosphatase: 79 IU/L (ref 44–121)
BUN/Creatinine Ratio: 11 — ABNORMAL LOW (ref 12–28)
BUN: 12 mg/dL (ref 8–27)
Bilirubin Total: <0.2 mg/dL (ref 0.0–1.2)
CO2: 23 mmol/L (ref 20–29)
Calcium: 9.4 mg/dL (ref 8.7–10.3)
Chloride: 107 mmol/L — ABNORMAL HIGH (ref 96–106)
Creatinine, Ser: 1.05 mg/dL — ABNORMAL HIGH (ref 0.57–1.00)
Globulin, Total: 2.9 g/dL (ref 1.5–4.5)
Glucose: 87 mg/dL (ref 70–99)
Potassium: 4.8 mmol/L (ref 3.5–5.2)
Sodium: 144 mmol/L (ref 134–144)
Total Protein: 7.1 g/dL (ref 6.0–8.5)
eGFR: 59 mL/min/{1.73_m2} — ABNORMAL LOW (ref >59)

## 2023-04-19 ENCOUNTER — Ambulatory Visit: Payer: Self-pay | Attending: Physician Assistant | Admitting: Physician Assistant

## 2023-04-19 ENCOUNTER — Encounter: Payer: Self-pay | Admitting: Physician Assistant

## 2023-06-26 NOTE — Progress Notes (Deleted)
Cardiology Office Note:  .   Date:  06/26/2023  ID:  EMMAREE DAMPIER, DOB 05-08-1956, MRN [id number] PCP: Claiborne Rigg, NP  [facility] Providers Cardiologist:  Armanda Magic, MD { History of Present Illness: Marland Kitchen   Kimberly Bryant is a 67 y.o. female with a past medical history of hypertension, hyperlipidemia, angioedema on ACE inhibitor, GERD, CAD status post acute NSTEMI 05/2019 followed by CABG x 4 with LIMA to LAD, SVG to OM, SVG to diagonal, SVG to distal LAD, normal EF here for overdue follow-up of lung.  Last seen by Dr. Mayford Knife back in 2021.  Denied chest pain/pressure, SOB, DOE, PND, orthopnea, lower extremity edema, dizziness, palpitations, or syncope.  Compliant with medications and tolerating well.  She was seen by her primary care 2 months ago and was referred back to cardiology for palpitations and chronic diastolic heart failure.  Today, she***  ROS: Pertinent ROS in HPI  Studies Reviewed: .       Echocardiogram intraoperative 06/10/2019 SUMMARY    - No dissection noted after cannula removed.  - CO > 2.5, CI > 1.5 - Volume responsive   PRE-OP FINDINGS   Left Ventricle: The left ventricle has hyperdynamic systolic function,  with an ejection fraction of >65%. The cavity size was normal. There is  severe concentric left ventricular hypertrophy. No evidence of left  ventricular regional wall motion  abnormalities.   Right Ventricle: The right ventricle has normal systolic function. The  cavity was normal. There is no increase in right ventricular wall  thickness. Right ventricular systolic pressure is normal.   Left Atrium: Left atrial size was dilated. The left atrial appendage is  well visualized and there is no evidence of thrombus present. Left atrial  appendage velocity is normal at greater than 40 cm/s.   Right Atrium: Right atrial size was dilated. Right atrial pressure is  estimated at 10 mmHg.   Interatrial Septum: No atrial level shunt detected by  color flow Doppler.   Pericardium: A small pericardial effusion is present. The pericardial  effusion is anterior to the right ventricle and surrounding the apex.  There is no evidence of cardiac tamponade. There is no pleural effusion.   Mitral Valve: The mitral valve is normal in structure. Mild thickening of  the mitral valve leaflet. No calcification of the mitral valve leaflet.  Mitral valve regurgitation is mild to moderate by color flow Doppler. The  MR jet is centrally-directed.  There is no evidence of mitral valve vegetation.   Tricuspid Valve: The tricuspid valve was normal in structure. Tricuspid  valve regurgitation is trivial by color flow Doppler. The tricuspid valve  is mildly thickened. No TV vegetation was visualized.   Aortic Valve: The aortic valve is normal in structure. Aortic valve  regurgitation was not visualized by color flow Doppler. There is no  evidence of aortic valve stenosis. There is no evidence of a vegetation on  the aortic valve.   Pulmonic Valve: The pulmonic valve was normal in structure No evidence of  pumonic stenosis.  Pulmonic valve regurgitation is not visualized by color flow Doppler.    Aorta: The aortic root and aortic arch are normal in size and structure.  There is dilatation of the aortic root.   Pulmonary Artery: The pulmonary artery is of normal size and origin.          Physical Exam:   VS:  There were no vitals taken for this visit.   Wt Readings  from Last 3 Encounters:  04/17/23 216 lb 6.4 oz (98.2 kg)  11/27/22 214 lb (97.1 kg)  08/27/22 215 lb (97.5 kg)    GEN: Well nourished, well developed in no acute distress NECK: No JVD; No carotid bruits CARDIAC: ***RRR, no murmurs, rubs, gallops RESPIRATORY:  Clear to auscultation without rales, wheezing or rhonchi  ABDOMEN: Soft, non-tender, non-distended EXTREMITIES:  No edema; No deformity   ASSESSMENT AND PLAN: .   1.  Hypertension 2.  HLD 3. Palpitations 4.  Chronic  diastolic heart failure    {Are you ordering a CV Procedure (e.g. stress test, cath, DCCV, TEE, etc)?   Press F2        :[id number]}  Dispo: ***  Signed, Sharlene Dory, PA-C

## 2023-06-27 ENCOUNTER — Ambulatory Visit: Payer: Self-pay | Attending: Physician Assistant | Admitting: Physician Assistant

## 2023-06-27 DIAGNOSIS — I10 Essential (primary) hypertension: Secondary | ICD-10-CM

## 2023-06-27 DIAGNOSIS — I5032 Chronic diastolic (congestive) heart failure: Secondary | ICD-10-CM

## 2023-06-27 DIAGNOSIS — R002 Palpitations: Secondary | ICD-10-CM

## 2023-06-27 DIAGNOSIS — E785 Hyperlipidemia, unspecified: Secondary | ICD-10-CM

## 2023-07-22 ENCOUNTER — Ambulatory Visit: Payer: Self-pay | Admitting: Nurse Practitioner

## 2023-08-15 ENCOUNTER — Other Ambulatory Visit: Payer: Self-pay

## 2023-08-22 ENCOUNTER — Telehealth: Payer: Self-pay

## 2023-08-22 ENCOUNTER — Other Ambulatory Visit: Payer: Self-pay | Admitting: Nurse Practitioner

## 2023-08-22 DIAGNOSIS — Z794 Long term (current) use of insulin: Secondary | ICD-10-CM

## 2023-08-22 DIAGNOSIS — I10 Essential (primary) hypertension: Secondary | ICD-10-CM

## 2023-08-22 MED ORDER — METFORMIN HCL 500 MG PO TABS
1000.0000 mg | ORAL_TABLET | Freq: Two times a day (BID) | ORAL | 0 refills | Status: DC
Start: 2023-08-22 — End: 2023-09-05

## 2023-08-22 MED ORDER — FUROSEMIDE 20 MG PO TABS
20.0000 mg | ORAL_TABLET | Freq: Every day | ORAL | 0 refills | Status: DC
Start: 2023-08-22 — End: 2023-09-05

## 2023-08-22 NOTE — Telephone Encounter (Signed)
Medication Refill - Medication: metFORMIN (GLUCOPHAGE) 500 MG tablet [[id number]] amLODipine (NORVASC) 10 MG tablet [[id number]] losartan (COZAAR) 25 MG tablet [[id number]] furosemide (LASIX) 20 MG tablet [[id number]]   Has the patient contacted their pharmacy? Yes.     (Agent: If yes, when and what did the pharmacy advise?) Contact PCP   Preferred Pharmacy (with phone number or street name): [id number] - [place], [place] - [address]   Has the patient been seen for an appointment in the last year OR does the patient have an upcoming appointment? Yes.    Agent: Please be advised that RX refills may take up to 3 business days. We ask that you follow-up with your pharmacy.

## 2023-08-22 NOTE — Telephone Encounter (Signed)
Copied from CRM [id number]. Topic: General - Other >> Aug 22, 2023 12:48 PM Franchot Heidelberg wrote: Reason for CRM: Pt's son called requesting for a refill of diabetic medication. He does not know the name, he is currently at the [facility] pharmacy now in [facility]. Advised him to ask the pharmacy tech to give him the name of the medication he is requesting, pt's son never provided answer.

## 2023-08-22 NOTE — Telephone Encounter (Addendum)
Pt's son Gene calling in for medication for DM to be refilled. He is currently at [facility]. Advised to him he was not on DPR but pt was with him. Refilled metformin and furosemide x 90 days and advised for him to keep pt's appt on 09/05/23 for FU.   Requested Prescriptions  Pending Prescriptions Disp Refills   furosemide (LASIX) 20 MG tablet 90 tablet 0    Sig: Take 1 tablet (20 mg total) by mouth daily.     Cardiovascular:  Diuretics - Loop Failed - 08/22/2023 11:28 AM      Failed - Cr in normal range and within 180 days    Creatinine, Ser  Date Value Ref Range Status  04/17/2023 1.05 (H) 0.57 - 1.00 mg/dL Final         Failed - Cl in normal range and within 180 days    Chloride  Date Value Ref Range Status  04/17/2023 107 (H) 96 - 106 mmol/L Final         Failed - Mg Level in normal range and within 180 days    Magnesium  Date Value Ref Range Status  09/06/2020 1.7 1.7 - 2.4 mg/dL Final    Comment:    Performed at [facility], [address] [place], [place] [address]         Passed - K in normal range and within 180 days    Potassium  Date Value Ref Range Status  04/17/2023 4.8 3.5 - 5.2 mmol/L Final         Passed - Ca in normal range and within 180 days    Calcium  Date Value Ref Range Status  04/17/2023 9.4 8.7 - 10.3 mg/dL Final   Calcium, Ion  Date Value Ref Range Status  09/04/2020 1.17 1.15 - 1.40 mmol/L Final         Passed - Na in normal range and within 180 days    Sodium  Date Value Ref Range Status  04/17/2023 144 134 - 144 mmol/L Final         Passed - Last BP in normal range    BP Readings from Last 1 Encounters:  04/17/23 125/81         Passed - Valid encounter within last 6 months    Recent Outpatient Visits           4 months ago Primary hypertension   [facility] [place], [place] W, NP   8 months ago Type 2 diabetes mellitus with hyperglycemia, with long-term current use of insulin  [facility])   [place] [facility] [place], Shea Stakes, NP   10 months ago Primary hypertension   [facility] [place], Cornelius Moras, RPH-CPP   12 months ago Primary hypertension   [place] [facility] [place], Shea Stakes, NP   1 year ago Primary hypertension   [facility] [place], [place] W, NP       Future Appointments             In 2 weeks Anders Simmonds, [place] [facility]             metFORMIN (GLUCOPHAGE) 500 MG tablet 360 tablet 0    Sig: Take 2 tablets (1,000 mg total) by mouth 2 (two) times daily with a meal.     Endocrinology:  Diabetes - Biguanides Failed - 08/22/2023 11:28 AM      Failed - Cr in normal range and within 360 days    Creatinine, Ser  Date Value Ref Range Status  04/17/2023 1.05 (H) 0.57 - 1.00 mg/dL Final         Failed - HBA1C is between 0 and 7.9 and within 180 days    HbA1c, POC (prediabetic range)  Date Value Ref Range Status  05/16/2022 6.2 5.7 - 6.4 % Final   HbA1c, POC (controlled diabetic range)  Date Value Ref Range Status  11/27/2022 6.7 0.0 - 7.0 % Final         Failed - eGFR in normal range and within 360 days    GFR calc Af Amer  Date Value Ref Range Status  02/23/2020 67 >59 mL/min/1.73 Final   GFR, Estimated  Date Value Ref Range Status  09/07/2020 >60 >60 mL/min Final    Comment:    (NOTE) Calculated using the CKD-EPI Creatinine Equation (2021)    eGFR  Date Value Ref Range Status  04/17/2023 59 (L) >59 mL/min/1.73 Final         Failed - B12 Level in normal range and within 720 days    No results found for: "VITAMINB12"       Failed - CBC within normal limits and completed in the last 12 months    WBC  Date Value Ref Range Status  05/16/2022 7.5 3.4 - 10.8 x10E3/uL Final  09/07/2020 6.3 4.0 - 10.5 K/uL Final   RBC  Date Value Ref Range Status  05/16/2022 4.93  3.77 - 5.28 x10E6/uL Final  09/07/2020 4.70 3.87 - 5.11 MIL/uL Final   Hemoglobin  Date Value Ref Range Status  05/16/2022 13.3 11.1 - 15.9 g/dL Final   Hematocrit  Date Value Ref Range Status  05/16/2022 41.7 34.0 - 46.6 % Final   MCHC  Date Value Ref Range Status  05/16/2022 31.9 31.5 - 35.7 g/dL Final  81/19/1478 29.5 30.0 - 36.0 g/dL Final   [facility]  Date Value Ref Range Status  05/16/2022 27.0 26.6 - 33.0 pg Final  09/07/2020 28.3 26.0 - 34.0 pg Final   MCV  Date Value Ref Range Status  05/16/2022 85 79 - 97 fL Final   No results found for: "PLTCOUNTKUC", "LABPLAT", "POCPLA" RDW  Date Value Ref Range Status  05/16/2022 12.7 11.7 - 15.4 % Final         Passed - Valid encounter within last 6 months    Recent Outpatient Visits           4 months ago Primary hypertension   [facility] [place], [place] W, NP   8 months ago Type 2 diabetes mellitus with hyperglycemia, with long-term current use of insulin [facility])   [facility] [place], Shea Stakes, NP   10 months ago Primary hypertension   [facility] [place], Cornelius Moras, RPH-CPP   12 months ago Primary hypertension   [place] [facility] [place], Shea Stakes, NP   1 year ago Primary hypertension   [place] [facility] Claiborne Rigg, NP       Future Appointments             In 2 weeks Anders Simmonds, PA-C [facility]  Refused Prescriptions Disp Refills   amLODipine (NORVASC) 10 MG tablet 90 tablet 1    Sig: Take 1 tablet (10 mg total) by mouth daily.     Cardiovascular: Calcium Channel Blockers 2 Passed - 08/22/2023 11:28 AM      Passed - Last BP in normal range    BP Readings from Last 1 Encounters:  04/17/23 125/81         Passed - Last Heart Rate in normal range    Pulse Readings from Last 1 Encounters:   04/17/23 83         Passed - Valid encounter within last 6 months    Recent Outpatient Visits           4 months ago Primary hypertension   [place] [place], [place] W, NP   8 months ago Type 2 diabetes mellitus with hyperglycemia, with long-term current use of insulin [facility])   [place] [facility] [place], Shea Stakes, NP   10 months ago Primary hypertension   [facility] [place], Cornelius Moras, RPH-CPP   12 months ago Primary hypertension   [place] [facility] [place], Shea Stakes, NP   1 year ago Primary hypertension   [facility] [place], Shea Stakes, NP       Future Appointments             In 2 weeks [place], Marzella Schlein, [place] [facility]             losartan (COZAAR) 25 MG tablet 90 tablet 1    Sig: Take 1 tablet (25 mg total) by mouth daily.     Cardiovascular:  Angiotensin Receptor Blockers Failed - 08/22/2023 11:28 AM      Failed - Cr in normal range and within 180 days    Creatinine, Ser  Date Value Ref Range Status  04/17/2023 1.05 (H) 0.57 - 1.00 mg/dL Final         Passed - K in normal range and within 180 days    Potassium  Date Value Ref Range Status  04/17/2023 4.8 3.5 - 5.2 mmol/L Final         Passed - Patient is not pregnant      Passed - Last BP in normal range    BP Readings from Last 1 Encounters:  04/17/23 125/81         Passed - Valid encounter within last 6 months    Recent Outpatient Visits           4 months ago Primary hypertension   [facility] [place], [place] W, NP   8 months ago Type 2 diabetes mellitus with hyperglycemia, with long-term current use of insulin [facility])   [facility] [place], Shea Stakes, NP   10 months ago Primary hypertension   [facility] [place], Cornelius Moras, RPH-CPP   12 months ago Primary hypertension   [facility] [place], Shea Stakes, NP   1 year ago Primary hypertension    [facility] [place], Shea Stakes, NP       Future Appointments             In 2 weeks Sharon Seller, [place] [facility]

## 2023-08-26 NOTE — Telephone Encounter (Signed)
Medication has been sent.  

## 2023-09-05 ENCOUNTER — Ambulatory Visit: Payer: Self-pay | Admitting: Physician Assistant

## 2023-09-05 ENCOUNTER — Other Ambulatory Visit: Payer: Self-pay

## 2023-09-05 ENCOUNTER — Ambulatory Visit: Payer: Self-pay | Attending: Nurse Practitioner | Admitting: Physician Assistant

## 2023-09-05 ENCOUNTER — Encounter: Payer: Self-pay | Admitting: Physician Assistant

## 2023-09-05 VITALS — BP 125/84 | HR 68 | Wt 212.8 lb

## 2023-09-05 DIAGNOSIS — I5032 Chronic diastolic (congestive) heart failure: Secondary | ICD-10-CM

## 2023-09-05 DIAGNOSIS — R0609 Other forms of dyspnea: Secondary | ICD-10-CM

## 2023-09-05 DIAGNOSIS — Z603 Acculturation difficulty: Secondary | ICD-10-CM

## 2023-09-05 DIAGNOSIS — I10 Essential (primary) hypertension: Secondary | ICD-10-CM

## 2023-09-05 DIAGNOSIS — Z7984 Long term (current) use of oral hypoglycemic drugs: Secondary | ICD-10-CM

## 2023-09-05 DIAGNOSIS — E785 Hyperlipidemia, unspecified: Secondary | ICD-10-CM

## 2023-09-05 DIAGNOSIS — E1165 Type 2 diabetes mellitus with hyperglycemia: Secondary | ICD-10-CM

## 2023-09-05 DIAGNOSIS — Z758 Other problems related to medical facilities and other health care: Secondary | ICD-10-CM

## 2023-09-05 LAB — POCT GLYCOSYLATED HEMOGLOBIN (HGB A1C): HbA1c, POC (controlled diabetic range): 6.9 % (ref 0.0–7.0)

## 2023-09-05 LAB — GLUCOSE, POCT (MANUAL RESULT ENTRY): POC Glucose: 108 mg/dL — AB (ref 70–99)

## 2023-09-05 MED ORDER — LOSARTAN POTASSIUM 25 MG PO TABS
25.0000 mg | ORAL_TABLET | Freq: Every day | ORAL | 1 refills | Status: DC
  Filled 2023-09-05: qty 90, 90d supply, fill #0

## 2023-09-05 MED ORDER — FUROSEMIDE 20 MG PO TABS
20.0000 mg | ORAL_TABLET | Freq: Every day | ORAL | 0 refills | Status: DC
Start: 2023-09-05 — End: 2024-06-16
  Filled 2023-09-05: qty 90, 90d supply, fill #0

## 2023-09-05 MED ORDER — AMLODIPINE BESYLATE 10 MG PO TABS
10.0000 mg | ORAL_TABLET | Freq: Every day | ORAL | 1 refills | Status: DC
  Filled 2023-09-05: qty 90, 90d supply, fill #0

## 2023-09-05 MED ORDER — METOPROLOL SUCCINATE ER 25 MG PO TB24
25.0000 mg | ORAL_TABLET | Freq: Every day | ORAL | 1 refills | Status: DC
  Filled 2023-09-05: qty 90, 90d supply, fill #0

## 2023-09-05 MED ORDER — ROSUVASTATIN CALCIUM 40 MG PO TABS
40.0000 mg | ORAL_TABLET | Freq: Every day | ORAL | 1 refills | Status: DC
  Filled 2023-09-05: qty 90, 90d supply, fill #0

## 2023-09-05 MED ORDER — METFORMIN HCL 500 MG PO TABS
1000.0000 mg | ORAL_TABLET | Freq: Two times a day (BID) | ORAL | 0 refills | Status: DC
Start: 2023-09-05 — End: 2024-06-16
  Filled 2023-09-05: qty 360, 90d supply, fill #0

## 2023-09-05 NOTE — Progress Notes (Signed)
Patient ID: Kimberly Bryant, female   DOB: 1956-06-06, 67 y.o.   MRN: [id number]   Kimberly Bryant, is a 67 y.o. female  WUJ:[id number]  [id number]  DOB - 06-28-1956  Chief Complaint  Patient presents with   Diabetes       Subjective:   Kimberly Bryant is a 67 y.o. female here today for med RF.  She denies any new issues or concerns.  Denies CP/Dizziness/HA.  Admits to eating a lot of starchy foods and drinks gatorade but denies juices or sugars/sweets.  She has not seen cardiology in a while.  She denies CP or chest pressure but she does become short of breath when she has to walk up a hill or for long distances.  No SOB currently.  No claudication type symptoms.  No diaphoresis or nausea.    No problems updated.  ALLERGIES: Allergies  Allergen Reactions   Atorvastatin Other (See Comments)    Made body feel flushed and hot all over   Carafate [Sucralfate] Swelling and Other (See Comments)    Patient stated it made her face swell (angioedema)- no breathing issues, however   Lisinopril Swelling and Other (See Comments)    Patient stated it made her face swell (angioedema)- no breathing issues, however   Basaglar Kwikpen [Insulin Glargine] Rash    PAST MEDICAL HISTORY: Past Medical History:  Diagnosis Date   CAD (coronary artery disease)    status post acute NSTEMI 05/2019 followed by CABG x4 with a LIMA to the LAD, SVG to OM, SVG to diagonal, SVG to distal LAD.    Hyperlipidemia    Hypertension    Pleural effusion 06/24/2019   LEFT    MEDICATIONS AT HOME: Prior to Admission medications   Medication Sig Start Date End Date Taking? Authorizing Provider  Blood Glucose Monitoring Suppl (TRUE METRIX METER) w/Device KIT Use to check blood sugar once daily. 05/29/21  Yes Claiborne Rigg, NP  glucose blood (TRUE METRIX BLOOD GLUCOSE TEST) test strip Use to check blood sugar twice daily. 08/29/21  Yes Claiborne Rigg, NP  Insulin Pen Needle 32G X 4 MM MISC Use once daily with Basaglar  05/16/22  Yes Claiborne Rigg, NP  amLODipine (NORVASC) 10 MG tablet Take 1 tablet (10 mg total) by mouth daily. 09/05/23   Anders Simmonds, PA-C  furosemide (LASIX) 20 MG tablet Take 1 tablet (20 mg total) by mouth daily. 09/05/23   Anders Simmonds, PA-C  losartan (COZAAR) 25 MG tablet Take 1 tablet (25 mg total) by mouth daily. 09/05/23   Anders Simmonds, PA-C  metFORMIN (GLUCOPHAGE) 500 MG tablet Take 2 tablets (1,000 mg total) by mouth 2 (two) times daily with a meal. 09/05/23 12/04/23  Anders Simmonds, PA-C  metoprolol succinate (TOPROL-XL) 25 MG 24 hr tablet Take 1 tablet (25 mg total) by mouth daily. 09/05/23   Anders Simmonds, PA-C  rosuvastatin (CRESTOR) 40 MG tablet Take 1 tablet (40 mg total) by mouth daily. 09/05/23   Javid Kemler, Marzella Schlein, PA-C    ROS: Neg HEENT Neg cardiac Neg GI Neg GU Neg MS Neg psych Neg neuro  Objective:   Vitals:   09/05/23 1112  BP: 125/84  Pulse: 68  SpO2: 96%  Weight: 212 lb 12.8 oz (96.5 kg)   Exam General appearance : Awake, alert, not in any distress. Speech Clear. Not toxic looking HEENT: Atraumatic and Normocephalic Neck: Supple, no JVD. No cervical lymphadenopathy.  Chest: Good air entry bilaterally, CTAB.  No  rales/rhonchi/wheezing CVS: S1 S2 regular, no murmurs.  Extremities: B/L Lower Ext shows no edema, both legs are warm to touch Neurology: Awake alert, and oriented X 3, CN II-XII intact, Non focal Skin: No Rash  Data Review Lab Results  Component Value Date   HGBA1C 6.9 09/05/2023   HGBA1C 6.7 11/27/2022   HGBA1C 7.2 (H) 08/27/2022    Assessment & Plan   1. Type 2 diabetes mellitus with hyperglycemia, without long-term current use of insulin (HCC) A1C increased. Continue metformin and don't skip evening dose.  Work at a goal of eliminating sugary drinks, candy, desserts, sweets, refined sugars, processed foods, and white carbohydrates.   - Glucose (CBG) - HgB A1c  2. Primary hypertension controlled -  furosemide (LASIX) 20 MG tablet; Take 1 tablet (20 mg total) by mouth daily.  Dispense: 90 tablet; Refill: 0 - metoprolol succinate (TOPROL-XL) 25 MG 24 hr tablet; Take 1 tablet (25 mg total) by mouth daily.  Dispense: 90 tablet; Refill: 1 - losartan (COZAAR) 25 MG tablet; Take 1 tablet (25 mg total) by mouth daily.  Dispense: 90 tablet; Refill: 1 - amLODipine (NORVASC) 10 MG tablet; Take 1 tablet (10 mg total) by mouth daily.  Dispense: 90 tablet; Refill: 1 - Ambulatory referral to Cardiology  3. Type 2 diabetes mellitus with hyperglycemia, with long-term current use of insulin (HCC) Continue and work on low sugar and low starch diet - metFORMIN (GLUCOPHAGE) 500 MG tablet; Take 2 tablets (1,000 mg total) by mouth 2 (two) times daily with a meal.  Dispense: 360 tablet; Refill: 0  4. Dyslipidemia, goal LDL below 70 - rosuvastatin (CRESTOR) 40 MG tablet; Take 1 tablet (40 mg total) by mouth daily.  Dispense: 90 tablet; Refill: 1  5. DOE (dyspnea on exertion) Likely due to deconditioning and no regular exercise but she has not seen cardiology in a while and does need cardiology follow up due to cardiac history - Ambulatory referral to Cardiology No red flags today.  If worsens/increases or develops chest pain/nausea, etc, encourage to go to ED or call 911.  Patient verbalized understanding.    6. Chronic diastolic heart failure (HCC) - furosemide (LASIX) 20 MG tablet; Take 1 tablet (20 mg total) by mouth daily.  Dispense: 90 tablet; Refill: 0 - Ambulatory referral to Cardiology  7. Language barrier-[employer] ([name]) interpreters used and additional time performing visit was required.   Return in about 3 months (around 12/06/2023) for PCP for chronic conditions-[name].  The patient was given clear instructions to go to ER or return to medical center if symptoms don't improve, worsen or new problems develop. The patient verbalized understanding. The patient was told to call to get lab results if they  haven't heard anything in the next week.      [place], PA-C [facility] [place], [place] [phone]   09/05/2023, 11:49 AM

## 2023-12-09 ENCOUNTER — Ambulatory Visit: Payer: Self-pay | Admitting: Nurse Practitioner

## 2024-01-13 ENCOUNTER — Telehealth (INDEPENDENT_AMBULATORY_CARE_PROVIDER_SITE_OTHER): Payer: Self-pay | Admitting: Primary Care

## 2024-01-13 NOTE — Telephone Encounter (Signed)
 Called pt to confirm appt. Pt did not answer and could not leave VM

## 2024-01-20 ENCOUNTER — Telehealth (INDEPENDENT_AMBULATORY_CARE_PROVIDER_SITE_OTHER): Payer: Self-pay | Admitting: Pharmacist

## 2024-01-20 ENCOUNTER — Ambulatory Visit (INDEPENDENT_AMBULATORY_CARE_PROVIDER_SITE_OTHER): Payer: Self-pay | Admitting: Primary Care

## 2024-01-20 NOTE — Telephone Encounter (Signed)
 Called to reschedule appt with pt because appt is cancelled due to provider not being in office. Pt hung up on me and appt was not rescheduled.

## 2024-03-09 ENCOUNTER — Other Ambulatory Visit: Payer: Self-pay | Admitting: Nurse Practitioner

## 2024-03-09 DIAGNOSIS — E1165 Type 2 diabetes mellitus with hyperglycemia: Secondary | ICD-10-CM

## 2024-03-09 DIAGNOSIS — E785 Hyperlipidemia, unspecified: Secondary | ICD-10-CM

## 2024-03-09 DIAGNOSIS — I10 Essential (primary) hypertension: Secondary | ICD-10-CM

## 2024-03-09 DIAGNOSIS — I5032 Chronic diastolic (congestive) heart failure: Secondary | ICD-10-CM

## 2024-03-09 NOTE — Telephone Encounter (Signed)
 Copied from CRM [id number]. Topic: Clinical - Medication Refill >> Mar 09, 2024  1:47 PM Yolanda T wrote: Most Recent Primary Care Visit:  Provider: Hassie Lint  Department[facility]  Visit Type: OFFICE VISIT  Date: 09/05/2023  Medication:  amLODipine  (NORVASC ) 10 MG tablet  furosemide  (LASIX ) 20 MG tablet  losartan  (COZAAR ) 25 MG tablet metFORMIN  (GLUCOPHAGE ) 500 MG tablet metoprolol  succinate (TOPROL -XL) 25 MG 24 hr tablet  rosuvastatin  (CRESTOR ) 40 MG tablet  Has the patient contacted their pharmacy? No (Agent: If no, request that the patient contact the pharmacy for the refill. If patient does not wish to contact the pharmacy document the reason why and proceed with request.) (Agent: If yes, when and what did the pharmacy advise?)  Is this the correct pharmacy for this prescription? Yes If no, delete pharmacy and type the correct one.  This is the patient's preferred pharmacy:  [facility] [place], [place] - [address] [place] [address] Phone: [phone] Fax: [phone]  Has the prescription been filled recently? Yes  Is the patient out of the medication? Yes  Has the patient been seen for an appointment in the last year OR does the patient have an upcoming appointment? Yes  Can we respond through MyChart? No  Agent: Please be advised that Rx refills may take up to 3 business days. We ask that you follow-up with your pharmacy.

## 2024-06-16 ENCOUNTER — Ambulatory Visit: Attending: Nurse Practitioner | Admitting: Nurse Practitioner

## 2024-06-16 ENCOUNTER — Encounter: Payer: Self-pay | Admitting: Nurse Practitioner

## 2024-06-16 ENCOUNTER — Ambulatory Visit: Payer: Self-pay | Attending: Nurse Practitioner | Admitting: Nurse Practitioner

## 2024-06-16 ENCOUNTER — Other Ambulatory Visit: Payer: Self-pay

## 2024-06-16 VITALS — BP 175/108 | HR 66 | Resp 19 | Ht 65.0 in | Wt 213.2 lb

## 2024-06-16 VITALS — BP 175/108 | HR 66 | Resp 12 | Ht 65.0 in | Wt 216.0 lb

## 2024-06-16 DIAGNOSIS — Z7984 Long term (current) use of oral hypoglycemic drugs: Secondary | ICD-10-CM

## 2024-06-16 DIAGNOSIS — M2550 Pain in unspecified joint: Secondary | ICD-10-CM

## 2024-06-16 DIAGNOSIS — Z794 Long term (current) use of insulin: Secondary | ICD-10-CM

## 2024-06-16 DIAGNOSIS — I5032 Chronic diastolic (congestive) heart failure: Secondary | ICD-10-CM

## 2024-06-16 DIAGNOSIS — E119 Type 2 diabetes mellitus without complications: Secondary | ICD-10-CM

## 2024-06-16 DIAGNOSIS — I10 Essential (primary) hypertension: Secondary | ICD-10-CM

## 2024-06-16 DIAGNOSIS — M25562 Pain in left knee: Secondary | ICD-10-CM

## 2024-06-16 DIAGNOSIS — G8918 Other acute postprocedural pain: Secondary | ICD-10-CM

## 2024-06-16 DIAGNOSIS — E1165 Type 2 diabetes mellitus with hyperglycemia: Secondary | ICD-10-CM

## 2024-06-16 DIAGNOSIS — M25561 Pain in right knee: Secondary | ICD-10-CM

## 2024-06-16 DIAGNOSIS — R0789 Other chest pain: Secondary | ICD-10-CM

## 2024-06-16 DIAGNOSIS — E785 Hyperlipidemia, unspecified: Secondary | ICD-10-CM

## 2024-06-16 DIAGNOSIS — M549 Dorsalgia, unspecified: Secondary | ICD-10-CM

## 2024-06-16 MED ORDER — LOSARTAN POTASSIUM 25 MG PO TABS
25.0000 mg | ORAL_TABLET | Freq: Every day | ORAL | 1 refills | Status: AC
  Filled 2024-06-16: qty 90, 90d supply, fill #0

## 2024-06-16 MED ORDER — AMLODIPINE BESYLATE 10 MG PO TABS
10.0000 mg | ORAL_TABLET | Freq: Every day | ORAL | 1 refills | Status: AC
  Filled 2024-06-16: qty 90, 90d supply, fill #0

## 2024-06-16 MED ORDER — TRAMADOL HCL 50 MG PO TABS: 50.0000 mg | ORAL_TABLET | Freq: Two times a day (BID) | ORAL | 1 refills | Status: AC | PRN

## 2024-06-16 MED ORDER — ROSUVASTATIN CALCIUM 40 MG PO TABS
40.0000 mg | ORAL_TABLET | Freq: Every day | ORAL | 1 refills | Status: AC
  Filled 2024-06-16: qty 90, 90d supply, fill #0

## 2024-06-16 MED ORDER — METOPROLOL SUCCINATE ER 25 MG PO TB24
25.0000 mg | ORAL_TABLET | Freq: Every day | ORAL | 1 refills | Status: AC
  Filled 2024-06-16: qty 90, 90d supply, fill #0

## 2024-06-16 MED ORDER — METFORMIN HCL 500 MG PO TABS
1000.0000 mg | ORAL_TABLET | Freq: Two times a day (BID) | ORAL | 1 refills | Status: AC
  Filled 2024-06-16: qty 360, 90d supply, fill #0

## 2024-06-16 MED ORDER — FUROSEMIDE 20 MG PO TABS
20.0000 mg | ORAL_TABLET | Freq: Every day | ORAL | 1 refills | Status: AC
  Filled 2024-06-16: qty 90, 90d supply, fill #0

## 2024-06-16 MED ORDER — TRAMADOL HCL 50 MG PO TABS
50.0000 mg | ORAL_TABLET | Freq: Two times a day (BID) | ORAL | 1 refills | Status: AC | PRN
  Filled 2024-06-16: qty 30, 15d supply, fill #0

## 2024-06-16 NOTE — Progress Notes (Signed)
 Assessment & Plan:  Kimberly Bryant was seen today for hypertension.  Diagnoses and all orders for this visit:  Primary hypertension Amlodipine  refill needed for management. Continue all antihypertensives as prescribed.  Reminded to bring in blood pressure log for follow  up appointment.  RECOMMENDATIONS: DASH/Mediterranean Diets are healthier choices for HTN.    Pain at surgical site Chronic pain with no abnormalities on previous CT and workup. - Referred to cardiologist for further evaluation.  Diabetes mellitus treated with oral medication  Continue metformin  as prescribed.  A1c pending   Back pain and bilateral knee pain Significant pain requiring medication. - Prescribed tramadol  for severe pain. - Advised extra strength Tylenol  for less severe pain.      Patient has been counseled on age-appropriate routine health concerns for screening and prevention. These are reviewed and up-to-date. Referrals have been placed accordingly. Immunizations are up-to-date or declined.    Subjective:   Chief Complaint  Patient presents with   Hypertension   History of Present Illness Kimberly Bryant is a 68 year old female with hypertension who presents for a blood pressure medication refill and evaluation of back and knee pain.  She has a past medical history of CAD, Hyperlipidemia, Hypertension, and Pleural effusion (06/24/2019).   VRI was used to communicate directly with patient for the entire encounter including providing detailed patient instructions.    BACK PAIN She experiences severe back pain, described as intense and sometimes debilitating, which makes it difficult for her to stand. The pain also affects both knees and has been persistent for an unspecified duration (she can not recall how long). She has been taking medication for the pain but cannot recall the name or whether it is prescription or over-the-counter. She also endorses bilateral knee pain.   She mentions chronic pain  at the site of a previous heart surgery incision, which has been ongoing for years. She has had previous workups, including a CT scan, for pain at the site of her heart surgery incision and all workups have been normal.  She reports her eyes being very watery at times, though she is unsure if this is related to her current medications or other conditions.  DM 2 She has not been taking insulin .  Currently taking metformin  1000 mg twice daily Lab Results  Component Value Date   HGBA1C 6.9 09/05/2023      HTN She is also due for a refill of her amlodipine , which has not been filled since October.  She is currently taking Toprol -XL 25 mg daily and losartan  25 mg daily BP Readings from Last 3 Encounters:  06/16/24 (!) 175/108  06/16/24 (!) 175/108  09/05/23 125/84     Review of Systems  Constitutional:  Negative for fever, malaise/fatigue and weight loss.  HENT: Negative.  Negative for nosebleeds.   Eyes: Negative.  Negative for blurred vision, double vision and photophobia.  Respiratory: Negative.  Negative for cough and shortness of breath.   Cardiovascular: Negative.  Negative for chest pain, palpitations and leg swelling.  Gastrointestinal: Negative.  Negative for heartburn, nausea and vomiting.  Musculoskeletal:  Positive for back pain and joint pain. Negative for myalgias.  Neurological: Negative.  Negative for dizziness, focal weakness, seizures and headaches.  Psychiatric/Behavioral: Negative.  Negative for suicidal ideas.     Past Medical History:  Diagnosis Date   CAD (coronary artery disease)    status post acute NSTEMI 05/2019 followed by CABG x4 with a LIMA to the LAD, SVG to OM, SVG  to diagonal, SVG to distal LAD.    Hyperlipidemia    Hypertension    Pleural effusion 06/24/2019   LEFT    Past Surgical History:  Procedure Laterality Date   APPLICATION OF WOUND VAC N/A 06/23/2019   Procedure: APPLICATION OF WOUND VAC;  Surgeon: German Bartlett PEDLAR, MD;  Location: [facility]  OR;  Service: Thoracic;  Laterality: N/A;   CORONARY ARTERY BYPASS GRAFT N/A 06/10/2019   Procedure: CORONARY ARTERY BYPASS GRAFTING (CABG) x Four, using left internal mammary artery and right leg greater saphenous vein harvested endoscopically;  Surgeon: German Bartlett PEDLAR, MD;  Location: [facility] OR;  Service: Open Heart Surgery;  Laterality: N/A;   LEFT HEART CATH AND CORONARY ANGIOGRAPHY N/A 06/09/2019   Procedure: LEFT HEART CATH AND CORONARY ANGIOGRAPHY;  Surgeon: Burnard Debby LABOR, MD;  Location: [facility] INVASIVE [facility];  Service: Cardiovascular;  Laterality: N/A;   STERNAL WOUND DEBRIDEMENT N/A 06/23/2019   Procedure: STERNAL WOUND DEBRIDEMENT;  Surgeon: German Bartlett PEDLAR, MD;  Location: [facility] OR;  Service: Thoracic;  Laterality: N/A;   TEE WITHOUT CARDIOVERSION N/A 06/10/2019   Procedure: TRANSESOPHAGEAL ECHOCARDIOGRAM (TEE);  Surgeon: German Bartlett PEDLAR, MD;  Location: [facility] OR;  Service: Open Heart Surgery;  Laterality: N/A;    Family History  Problem Relation Age of Onset   Hypertension Mother     Social History Reviewed with no changes to be made today.   Outpatient Medications Prior to Visit  Medication Sig Dispense Refill   amLODipine  (NORVASC ) 10 MG tablet Take 1 tablet (10 mg total) by mouth daily. 90 tablet 1   Blood Glucose Monitoring Suppl (TRUE METRIX METER) w/Device KIT Use to check blood sugar once daily. (Patient not taking: Reported on 06/16/2024) 1 kit 0   furosemide  (LASIX ) 20 MG tablet Take 1 tablet (20 mg total) by mouth daily. 90 tablet 1   glucose blood (TRUE METRIX BLOOD GLUCOSE TEST) test strip Use to check blood sugar twice daily. (Patient not taking: Reported on 06/16/2024) 100 strip 2   Insulin  Pen Needle 32G X 4 MM MISC Use once daily with Basaglar  (Patient not taking: Reported on 06/16/2024) 100 each 11   losartan  (COZAAR ) 25 MG tablet Take 1 tablet (25 mg total) by mouth daily. 90 tablet 1   metFORMIN  (GLUCOPHAGE ) 500 MG tablet Take 2 tablets (1,000 mg total) by mouth 2 (two) times  daily with a meal. 360 tablet 1   metoprolol  succinate (TOPROL -XL) 25 MG 24 hr tablet Take 1 tablet (25 mg total) by mouth daily. 90 tablet 1   rosuvastatin  (CRESTOR ) 40 MG tablet Take 1 tablet (40 mg total) by mouth daily. 90 tablet 1   traMADol  (ULTRAM ) 50 MG tablet Take 1 tablet (50 mg total) by mouth every 12 (twelve) hours as needed. 30 tablet 1   traMADol  (ULTRAM ) 50 MG tablet Take 1 tablet (50 mg total) by mouth every 12 (twelve) hours as needed. 30 tablet 1   No facility-administered medications prior to visit.    Allergies  Allergen Reactions   Atorvastatin  Other (See Comments)    Made body feel flushed and hot all over   Carafate  [Sucralfate ] Swelling and Other (See Comments)    Patient stated it made her face swell (angioedema)- no breathing issues, however   Lisinopril  Swelling and Other (See Comments)    Patient stated it made her face swell (angioedema)- no breathing issues, however   Basaglar  Kwikpen [Insulin  Glargine] Rash       Objective:    BP ROLLEN)  175/108 (BP Location: Right Arm, Patient Position: Sitting, Cuff Size: Normal)   Pulse 66   Resp 12   Ht 5' 5 (1.651 m)   Wt 216 lb (98 kg)   BMI 35.94 kg/m  Wt Readings from Last 3 Encounters:  06/16/24 216 lb (98 kg)  06/16/24 213 lb 3.2 oz (96.7 kg)  09/05/23 212 lb 12.8 oz (96.5 kg)    Physical Exam Vitals and nursing note reviewed.  Constitutional:      Appearance: She is well-developed.  HENT:     Head: Normocephalic and atraumatic.  Cardiovascular:     Rate and Rhythm: Normal rate and regular rhythm.     Heart sounds: Normal heart sounds. No murmur heard.    No friction rub. No gallop.  Pulmonary:     Effort: Pulmonary effort is normal. No tachypnea or respiratory distress.     Breath sounds: Normal breath sounds. No decreased breath sounds, wheezing, rhonchi or rales.  Chest:     Chest wall: No tenderness.  Abdominal:     General: Bowel sounds are normal.     Palpations: Abdomen is soft.   Musculoskeletal:        General: Normal range of motion.     Cervical back: Normal range of motion.  Skin:    General: Skin is warm and dry.  Neurological:     Mental Status: She is alert and oriented to person, place, and time.     Coordination: Coordination normal.  Psychiatric:        Behavior: Behavior normal. Behavior is cooperative.        Thought Content: Thought content normal.        Judgment: Judgment normal.          Patient has been counseled extensively about nutrition and exercise as well as the importance of adherence with medications and regular follow-up. The patient was given clear instructions to go to ER or return to medical center if symptoms don't improve, worsen or new problems develop. The patient verbalized understanding.   Follow-up: No follow-ups on file.   Kimberly LELON Servant, FNP-BC [facility] [place], [place] [phone]   06/16/2024, 5:56 PM

## 2024-06-16 NOTE — Progress Notes (Signed)
 Back pain, and chest pain where previous surgery was preformed.

## 2024-06-16 NOTE — Progress Notes (Signed)
 PATIENT STATES HAND PAIN IS BETTER. DOES NOT NEED APPT.

## 2024-06-17 LAB — CBC WITH DIFFERENTIAL/PLATELET
Basophils Absolute: 0 x10E3/uL (ref 0.0–0.2)
Basos: 0 %
EOS (ABSOLUTE): 0.2 x10E3/uL (ref 0.0–0.4)
Eos: 3 %
Hematocrit: 44.5 % (ref 34.0–46.6)
Hemoglobin: 13.7 g/dL (ref 11.1–15.9)
Immature Grans (Abs): 0 x10E3/uL (ref 0.0–0.1)
Immature Granulocytes: 0 %
Lymphocytes Absolute: 4.1 x10E3/uL — ABNORMAL HIGH (ref 0.7–3.1)
Lymphs: 50 %
MCH: 27.6 pg (ref 26.6–33.0)
MCHC: 30.8 g/dL — ABNORMAL LOW (ref 31.5–35.7)
MCV: 90 fL (ref 79–97)
Monocytes Absolute: 0.5 x10E3/uL (ref 0.1–0.9)
Monocytes: 7 %
Neutrophils Absolute: 3.2 x10E3/uL (ref 1.4–7.0)
Neutrophils: 40 %
Platelets: 306 x10E3/uL (ref 150–450)
RBC: 4.97 x10E6/uL (ref 3.77–5.28)
RDW: 12.9 % (ref 11.7–15.4)
WBC: 8.1 x10E3/uL (ref 3.4–10.8)

## 2024-06-17 LAB — CMP14+EGFR
ALT: 15 IU/L (ref 0–32)
AST: 16 IU/L (ref 0–40)
Albumin: 4.5 g/dL (ref 3.9–4.9)
Alkaline Phosphatase: 76 IU/L (ref 44–121)
BUN/Creatinine Ratio: 11 — ABNORMAL LOW (ref 12–28)
BUN: 10 mg/dL (ref 8–27)
Bilirubin Total: 0.3 mg/dL (ref 0.0–1.2)
CO2: 24 mmol/L (ref 20–29)
Calcium: 9.5 mg/dL (ref 8.7–10.3)
Chloride: 103 mmol/L (ref 96–106)
Creatinine, Ser: 0.94 mg/dL (ref 0.57–1.00)
Globulin, Total: 2.9 g/dL (ref 1.5–4.5)
Glucose: 92 mg/dL (ref 70–99)
Potassium: 4.8 mmol/L (ref 3.5–5.2)
Sodium: 143 mmol/L (ref 134–144)
Total Protein: 7.4 g/dL (ref 6.0–8.5)
eGFR: 67 mL/min/1.73 (ref >59)

## 2024-06-17 LAB — LIPID PANEL
Chol/HDL Ratio: 3.9 ratio (ref 0.0–4.4)
Cholesterol, Total: 173 mg/dL (ref 100–199)
HDL: 44 mg/dL (ref >39)
LDL Chol Calc (NIH): 112 mg/dL — ABNORMAL HIGH (ref 0–99)
Triglycerides: 94 mg/dL (ref 0–149)
VLDL Cholesterol Cal: 17 mg/dL (ref 5–40)

## 2024-06-17 LAB — HEMOGLOBIN A1C
Est. average glucose Bld gHb Est-mCnc: 186 mg/dL
Hgb A1c MFr Bld: 8.1 % — ABNORMAL HIGH (ref 4.8–5.6)

## 2024-06-21 ENCOUNTER — Ambulatory Visit: Payer: Self-pay | Admitting: Nurse Practitioner

## 2024-08-17 ENCOUNTER — Telehealth: Payer: Self-pay | Admitting: Nurse Practitioner

## 2024-08-17 NOTE — Telephone Encounter (Signed)
 Called pt to reschedule appt. Pt did not answer and could not LVM. Please reschedule app if pt call back.

## 2024-09-16 ENCOUNTER — Ambulatory Visit: Admitting: Nurse Practitioner
